# Patient Record
Sex: Female | Born: 1940 | Race: White | Hispanic: No | Smoking: Never smoker
Health system: Southern US, Community
[De-identification: ages and names within clinical notes are randomized; demographics above are authoritative.]

## PROBLEM LIST (undated history)

## (undated) DIAGNOSIS — E785 Hyperlipidemia, unspecified: Secondary | ICD-10-CM

## (undated) DIAGNOSIS — M8580 Other specified disorders of bone density and structure, unspecified site: Secondary | ICD-10-CM

## (undated) DIAGNOSIS — Z923 Personal history of irradiation: Secondary | ICD-10-CM

## (undated) DIAGNOSIS — R011 Cardiac murmur, unspecified: Secondary | ICD-10-CM

## (undated) DIAGNOSIS — C50919 Malignant neoplasm of unspecified site of unspecified female breast: Secondary | ICD-10-CM

## (undated) DIAGNOSIS — Z853 Personal history of malignant neoplasm of breast: Secondary | ICD-10-CM

## (undated) DIAGNOSIS — E079 Disorder of thyroid, unspecified: Secondary | ICD-10-CM

## (undated) DIAGNOSIS — K219 Gastro-esophageal reflux disease without esophagitis: Secondary | ICD-10-CM

## (undated) DIAGNOSIS — H269 Unspecified cataract: Secondary | ICD-10-CM

## (undated) DIAGNOSIS — E663 Overweight: Secondary | ICD-10-CM

## (undated) DIAGNOSIS — E039 Hypothyroidism, unspecified: Secondary | ICD-10-CM

## (undated) DIAGNOSIS — I10 Essential (primary) hypertension: Secondary | ICD-10-CM

## (undated) DIAGNOSIS — T7840XA Allergy, unspecified, initial encounter: Secondary | ICD-10-CM

## (undated) DIAGNOSIS — Q210 Ventricular septal defect: Secondary | ICD-10-CM

## (undated) DIAGNOSIS — I38 Endocarditis, valve unspecified: Secondary | ICD-10-CM

## (undated) HISTORY — DX: Overweight: E66.3

## (undated) HISTORY — PX: TUBAL LIGATION: SHX77

## (undated) HISTORY — DX: Essential (primary) hypertension: I10

## (undated) HISTORY — PX: AUGMENTATION MAMMAPLASTY: SUR837

## (undated) HISTORY — PX: TONSILLECTOMY: SUR1361

## (undated) HISTORY — DX: Hyperlipidemia, unspecified: E78.5

## (undated) HISTORY — DX: Disorder of thyroid, unspecified: E07.9

## (undated) HISTORY — DX: Unspecified cataract: H26.9

## (undated) HISTORY — DX: Ventricular septal defect: Q21.0

## (undated) HISTORY — DX: Allergy, unspecified, initial encounter: T78.40XA

## (undated) HISTORY — DX: Endocarditis, valve unspecified: I38

## (undated) HISTORY — DX: Malignant neoplasm of unspecified site of unspecified female breast: C50.919

## (undated) HISTORY — DX: Other specified disorders of bone density and structure, unspecified site: M85.80

## (undated) HISTORY — DX: Personal history of malignant neoplasm of breast: Z85.3

---

## 1992-02-24 DIAGNOSIS — Z923 Personal history of irradiation: Secondary | ICD-10-CM

## 1992-02-24 DIAGNOSIS — C50919 Malignant neoplasm of unspecified site of unspecified female breast: Secondary | ICD-10-CM

## 1992-02-24 HISTORY — DX: Malignant neoplasm of unspecified site of unspecified female breast: C50.919

## 1992-02-24 HISTORY — PX: BREAST LUMPECTOMY: SHX2

## 1992-02-24 HISTORY — DX: Personal history of irradiation: Z92.3

## 1992-02-24 HISTORY — PX: BREAST BIOPSY: SHX20

## 1992-02-24 HISTORY — PX: BREAST IMPLANT REMOVAL: SUR1101

## 1998-11-25 ENCOUNTER — Other Ambulatory Visit: Admission: RE | Admit: 1998-11-25 | Discharge: 1998-11-25 | Payer: Self-pay | Admitting: Family Medicine

## 1999-12-03 ENCOUNTER — Other Ambulatory Visit: Admission: RE | Admit: 1999-12-03 | Discharge: 1999-12-03 | Payer: Self-pay | Admitting: Family Medicine

## 2000-12-03 ENCOUNTER — Other Ambulatory Visit: Admission: RE | Admit: 2000-12-03 | Discharge: 2000-12-03 | Payer: Self-pay | Admitting: Family Medicine

## 2001-11-28 ENCOUNTER — Other Ambulatory Visit: Admission: RE | Admit: 2001-11-28 | Discharge: 2001-11-28 | Payer: Self-pay | Admitting: Family Medicine

## 2002-02-23 HISTORY — PX: OTHER SURGICAL HISTORY: SHX169

## 2002-12-25 HISTORY — PX: PARATHYROIDECTOMY: SHX19

## 2003-01-04 ENCOUNTER — Other Ambulatory Visit: Admission: RE | Admit: 2003-01-04 | Discharge: 2003-01-04 | Payer: Self-pay | Admitting: Family Medicine

## 2003-04-20 ENCOUNTER — Encounter: Admission: RE | Admit: 2003-04-20 | Discharge: 2003-04-20 | Payer: Self-pay | Admitting: Psychology

## 2004-01-14 ENCOUNTER — Other Ambulatory Visit: Admission: RE | Admit: 2004-01-14 | Discharge: 2004-01-14 | Payer: Self-pay | Admitting: Family Medicine

## 2004-01-14 ENCOUNTER — Ambulatory Visit: Payer: Self-pay | Admitting: Family Medicine

## 2004-07-11 ENCOUNTER — Ambulatory Visit: Payer: Self-pay | Admitting: Family Medicine

## 2004-12-24 LAB — HM DEXA SCAN

## 2005-01-14 ENCOUNTER — Other Ambulatory Visit: Admission: RE | Admit: 2005-01-14 | Discharge: 2005-01-14 | Payer: Self-pay | Admitting: Family Medicine

## 2005-01-14 ENCOUNTER — Encounter: Payer: Self-pay | Admitting: Family Medicine

## 2005-01-14 ENCOUNTER — Ambulatory Visit: Payer: Self-pay | Admitting: Family Medicine

## 2005-05-06 ENCOUNTER — Ambulatory Visit: Payer: Self-pay | Admitting: Family Medicine

## 2005-05-07 LAB — FECAL OCCULT BLOOD, GUAIAC: Fecal Occult Blood: NEGATIVE

## 2005-06-11 ENCOUNTER — Ambulatory Visit: Payer: Self-pay | Admitting: Family Medicine

## 2005-07-08 ENCOUNTER — Ambulatory Visit: Payer: Self-pay | Admitting: Family Medicine

## 2005-08-14 ENCOUNTER — Ambulatory Visit: Payer: Self-pay | Admitting: Family Medicine

## 2006-01-20 ENCOUNTER — Ambulatory Visit: Payer: Self-pay | Admitting: Family Medicine

## 2006-06-30 ENCOUNTER — Ambulatory Visit: Payer: Self-pay | Admitting: Family Medicine

## 2006-07-01 LAB — CONVERTED CEMR LAB
ALT: 19 units/L (ref 0–40)
AST: 28 units/L (ref 0–37)
LDL Cholesterol: 103 mg/dL — ABNORMAL HIGH (ref 0–99)
Total CHOL/HDL Ratio: 2.8
VLDL: 20 mg/dL (ref 0–40)

## 2006-12-15 ENCOUNTER — Encounter: Payer: Self-pay | Admitting: Family Medicine

## 2007-01-26 ENCOUNTER — Encounter: Payer: Self-pay | Admitting: Family Medicine

## 2007-01-26 DIAGNOSIS — E785 Hyperlipidemia, unspecified: Secondary | ICD-10-CM | POA: Insufficient documentation

## 2007-01-26 DIAGNOSIS — I10 Essential (primary) hypertension: Secondary | ICD-10-CM | POA: Insufficient documentation

## 2007-01-26 DIAGNOSIS — Q210 Ventricular septal defect: Secondary | ICD-10-CM | POA: Insufficient documentation

## 2007-01-26 DIAGNOSIS — M8580 Other specified disorders of bone density and structure, unspecified site: Secondary | ICD-10-CM | POA: Insufficient documentation

## 2007-01-26 DIAGNOSIS — Z8679 Personal history of other diseases of the circulatory system: Secondary | ICD-10-CM | POA: Insufficient documentation

## 2007-01-26 DIAGNOSIS — Z853 Personal history of malignant neoplasm of breast: Secondary | ICD-10-CM | POA: Insufficient documentation

## 2007-01-31 ENCOUNTER — Ambulatory Visit: Payer: Self-pay | Admitting: Family Medicine

## 2007-02-03 ENCOUNTER — Encounter: Payer: Self-pay | Admitting: Family Medicine

## 2007-02-03 LAB — CONVERTED CEMR LAB
ALT: 23 units/L (ref 0–35)
Alkaline Phosphatase: 62 units/L (ref 39–117)
BUN: 10 mg/dL (ref 6–23)
Basophils Relative: 1.2 % — ABNORMAL HIGH (ref 0.0–1.0)
CO2: 29 meq/L (ref 19–32)
Creatinine, Ser: 1 mg/dL (ref 0.4–1.2)
Eosinophils Relative: 1.3 % (ref 0.0–5.0)
HCT: 40.3 % (ref 36.0–46.0)
HDL: 74.6 mg/dL (ref >39.0)
Hemoglobin: 14.1 g/dL (ref 12.0–15.0)
LDL Cholesterol: 99 mg/dL (ref 0–99)
Monocytes Absolute: 0.6 10*3/uL (ref 0.2–0.7)
Monocytes Relative: 9 % (ref 3.0–11.0)
Potassium: 3.9 meq/L (ref 3.5–5.1)
RDW: 12.2 % (ref 11.5–14.6)
Total Bilirubin: 0.9 mg/dL (ref 0.3–1.2)
Total Protein: 7.4 g/dL (ref 6.0–8.3)
VLDL: 25 mg/dL (ref 0–40)
Vit D, 1,25-Dihydroxy: 42 (ref 30–89)
WBC: 6.5 10*3/uL (ref 4.5–10.5)

## 2007-11-02 ENCOUNTER — Encounter: Payer: Self-pay | Admitting: Family Medicine

## 2007-11-10 ENCOUNTER — Encounter (INDEPENDENT_AMBULATORY_CARE_PROVIDER_SITE_OTHER): Payer: Self-pay | Admitting: *Deleted

## 2008-02-06 ENCOUNTER — Ambulatory Visit: Payer: Self-pay | Admitting: Family Medicine

## 2008-02-06 DIAGNOSIS — E039 Hypothyroidism, unspecified: Secondary | ICD-10-CM | POA: Insufficient documentation

## 2008-02-07 LAB — CONVERTED CEMR LAB
ALT: 21 units/L (ref 0–35)
AST: 29 units/L (ref 0–37)
Albumin: 4.6 g/dL (ref 3.5–5.2)
BUN: 14 mg/dL (ref 6–23)
Basophils Relative: 0.9 % (ref 0.0–3.0)
CO2: 29 meq/L (ref 19–32)
Chloride: 108 meq/L (ref 96–112)
Creatinine, Ser: 1 mg/dL (ref 0.4–1.2)
Eosinophils Relative: 2.2 % (ref 0.0–5.0)
LDL Cholesterol: 100 mg/dL — ABNORMAL HIGH (ref 0–99)
Lymphocytes Relative: 26.7 % (ref 12.0–46.0)
Monocytes Relative: 8.6 % (ref 3.0–12.0)
Neutrophils Relative %: 61.6 % (ref 43.0–77.0)
RBC: 4.55 M/uL (ref 3.87–5.11)
TSH: 0.43 microintl units/mL (ref 0.35–5.50)
VLDL: 22 mg/dL (ref 0–40)
WBC: 5.8 10*3/uL (ref 4.5–10.5)

## 2008-02-20 ENCOUNTER — Telehealth: Payer: Self-pay | Admitting: Family Medicine

## 2009-02-11 ENCOUNTER — Ambulatory Visit: Payer: Self-pay | Admitting: Family Medicine

## 2009-02-11 ENCOUNTER — Other Ambulatory Visit: Admission: RE | Admit: 2009-02-11 | Discharge: 2009-02-11 | Payer: Self-pay | Admitting: Family Medicine

## 2009-02-11 LAB — HM PAP SMEAR

## 2009-02-13 ENCOUNTER — Encounter (INDEPENDENT_AMBULATORY_CARE_PROVIDER_SITE_OTHER): Payer: Self-pay | Admitting: *Deleted

## 2009-02-13 LAB — CONVERTED CEMR LAB
ALT: 22 units/L (ref 0–35)
AST: 29 units/L (ref 0–37)
Albumin: 4.7 g/dL (ref 3.5–5.2)
Basophils Absolute: 0.1 10*3/uL (ref 0.0–0.1)
Basophils Relative: 1.4 % (ref 0.0–3.0)
Eosinophils Relative: 1.8 % (ref 0.0–5.0)
GFR calc non Af Amer: 66.45 mL/min (ref >60)
Glucose, Bld: 96 mg/dL (ref 70–99)
HCT: 41.8 % (ref 36.0–46.0)
Hemoglobin: 14.1 g/dL (ref 12.0–15.0)
Lymphs Abs: 1.7 10*3/uL (ref 0.7–4.0)
Monocytes Relative: 8.6 % (ref 3.0–12.0)
Neutro Abs: 3.8 10*3/uL (ref 1.4–7.7)
Potassium: 3.9 meq/L (ref 3.5–5.1)
RBC: 4.44 M/uL (ref 3.87–5.11)
RDW: 12.8 % (ref 11.5–14.6)
Sodium: 142 meq/L (ref 135–145)
TSH: 0.53 microintl units/mL (ref 0.35–5.50)
Total Protein: 7.5 g/dL (ref 6.0–8.3)
VLDL: 20.4 mg/dL (ref 0.0–40.0)

## 2009-02-20 ENCOUNTER — Encounter (INDEPENDENT_AMBULATORY_CARE_PROVIDER_SITE_OTHER): Payer: Self-pay | Admitting: *Deleted

## 2009-04-29 ENCOUNTER — Ambulatory Visit: Payer: Self-pay | Admitting: Family Medicine

## 2009-04-29 DIAGNOSIS — E559 Vitamin D deficiency, unspecified: Secondary | ICD-10-CM | POA: Insufficient documentation

## 2009-05-01 ENCOUNTER — Encounter (INDEPENDENT_AMBULATORY_CARE_PROVIDER_SITE_OTHER): Payer: Self-pay | Admitting: *Deleted

## 2009-10-09 ENCOUNTER — Encounter: Payer: Self-pay | Admitting: Family Medicine

## 2009-10-16 ENCOUNTER — Encounter: Payer: Self-pay | Admitting: Family Medicine

## 2009-10-16 ENCOUNTER — Encounter (INDEPENDENT_AMBULATORY_CARE_PROVIDER_SITE_OTHER): Payer: Self-pay | Admitting: *Deleted

## 2009-12-20 ENCOUNTER — Encounter: Payer: Self-pay | Admitting: Family Medicine

## 2010-02-06 ENCOUNTER — Ambulatory Visit: Payer: Self-pay | Admitting: Family Medicine

## 2010-02-06 ENCOUNTER — Telehealth (INDEPENDENT_AMBULATORY_CARE_PROVIDER_SITE_OTHER): Payer: Self-pay | Admitting: *Deleted

## 2010-02-06 ENCOUNTER — Encounter: Payer: Self-pay | Admitting: Family Medicine

## 2010-02-08 LAB — CONVERTED CEMR LAB
Alkaline Phosphatase: 61 units/L (ref 39–117)
BUN: 15 mg/dL (ref 6–23)
Basophils Absolute: 0.1 10*3/uL (ref 0.0–0.1)
Bilirubin, Direct: 0.1 mg/dL (ref 0.0–0.3)
CO2: 29 meq/L (ref 19–32)
Chloride: 107 meq/L (ref 96–112)
Creatinine, Ser: 1 mg/dL (ref 0.4–1.2)
Eosinophils Relative: 1.9 % (ref 0.0–5.0)
HCT: 41.5 % (ref 36.0–46.0)
HDL: 76.5 mg/dL (ref >39.00)
Hemoglobin: 14.2 g/dL (ref 12.0–15.0)
Lymphs Abs: 1.9 10*3/uL (ref 0.7–4.0)
MCV: 91.2 fL (ref 78.0–100.0)
Monocytes Absolute: 0.6 10*3/uL (ref 0.1–1.0)
Monocytes Relative: 8.8 % (ref 3.0–12.0)
Neutro Abs: 3.8 10*3/uL (ref 1.4–7.7)
Platelets: 279 10*3/uL (ref 150.0–400.0)
Potassium: 4.3 meq/L (ref 3.5–5.1)
RDW: 13.4 % (ref 11.5–14.6)
TSH: 1.42 microintl units/mL (ref 0.35–5.50)
Total Bilirubin: 0.8 mg/dL (ref 0.3–1.2)
Total CHOL/HDL Ratio: 3
VLDL: 22.4 mg/dL (ref 0.0–40.0)

## 2010-02-12 ENCOUNTER — Ambulatory Visit: Payer: Self-pay | Admitting: Family Medicine

## 2010-02-26 ENCOUNTER — Ambulatory Visit
Admission: RE | Admit: 2010-02-26 | Discharge: 2010-02-26 | Payer: Self-pay | Source: Home / Self Care | Attending: Family Medicine | Admitting: Family Medicine

## 2010-03-05 ENCOUNTER — Encounter: Payer: Self-pay | Admitting: Family Medicine

## 2010-03-05 ENCOUNTER — Ambulatory Visit: Payer: Self-pay | Admitting: Family Medicine

## 2010-03-25 NOTE — Miscellaneous (Signed)
Summary: flu vaccine  Clinical Lists Changes  Observations: Added new observation of FLU VAX: Historical at [facility] (12/13/2009 11:10)      Immunization History:  Influenza Immunization History:    Influenza:  historical at [facility] (12/13/2009)

## 2010-03-25 NOTE — Miscellaneous (Signed)
Summary: Flowsheet update  Clinical Lists Changes  Observations: Added new observation of MAMMOGRAM: Normal (10/09/2009 8:24)

## 2010-03-25 NOTE — Letter (Signed)
Summary: Results Follow up Letter  [place] [facility]  [address] [place], [place] [address]   Phone: [phone]  Fax: [phone]    10/16/2009 MRN: [id number]  Madison Huerta [address] [place], [place]  [address]  Dear Ms. Appleby,  The following are the results of your recent test(s):  Test         Result    Pap Smear:        Normal _____  Not Normal _____ Comments: ______________________________________________________ Cholesterol: LDL(Bad cholesterol):         Your goal is less than:         HDL (Good cholesterol):       Your goal is more than: Comments:  ______________________________________________________ Mammogram:        Normal __X___  Not Normal _____ Comments:Repeat in 1 year  ___________________________________________________________________ Hemoccult:        Normal _____  Not normal _______ Comments:    _____________________________________________________________________ Other Tests:    We routinely do not discuss normal results over the telephone.  If you desire a copy of the results, or you have any questions about this information we can discuss them at your next office visit.   Sincerely,      Janee Morn, CMA(AAMA)for Dr. [name]

## 2010-03-25 NOTE — Miscellaneous (Signed)
  Clinical Lists Changes  Medications: Added new medication of VITAMIN D 1000 UNIT  TABS (CHOLECALCIFEROL) Take 2,000 international units daily

## 2010-03-27 NOTE — Assessment & Plan Note (Signed)
Summary: 2 week bp check/rbh  Nurse Visit   Vital Signs:  Patient profile:   70 year old female O2 Sat:      98 % on Room air Pulse rate:   77 / minute Pulse rhythm:   regular BP sitting:   154 / 68  (left arm) Cuff size:   regular  Vitals Entered By: Mervin Hack CMA Duncan Dull) (February 26, 2010 8:58 AM)  O2 Flow:  Room air CC: Blood pressure check Comments pt here for blood pressure check, pt bought in record of checks over several weeks, records in your inbox.   Preventive Screening-Counseling & Management  Comments: her blood pressures at home ranged from 130s/50s to 142/66 (only one high reading)  Allergies: 1)  Penicillin 2)  Codeine  Orders Added: 1)  Est. Patient Nurse visit [09003]  Appended Document: 2 week bp check/rbh Madison Huerta-- was the 154/68 on her cuff or our cuff? -- thanks , Marne T  Appended Document: 2 week bp check/rbh our cuff

## 2010-03-27 NOTE — Assessment & Plan Note (Signed)
Summary: CPX / LFW   Vital Signs:  Patient profile:   70 year old female Height:      60.5 inches Weight:      137.50 pounds BMI:     26.51 Temp:     97.9 degrees F oral Pulse rate:   72 / minute Pulse rhythm:   regular BP sitting:   156 / 72  (right arm) Cuff size:   regular  Vitals Entered By: Lewanda Rife LPN (February 12, 2010 9:17 AM) CC: check up of chronic probs   Vision Screening:Left eye w/o correction: 20 / 40 Right Eye w/o correction: 20 / 50 Both eyes w/o correction:  20/ 40        Vision Entered By: Lewanda Rife LPN (February 12, 2010 9:17 AM)  Hearing Screen 25db HL: Left  500 hz: 25db 1000 hz: 25db 2000 hz: 25db 4000 hz: 40db Right  500 hz: 25db 1000 hz: 25db 2000 hz: 25db 4000 hz: 25db    History of Present Illness: here for check up of chronic med problems and to review health mt list   feels very good  no new medical issues   wt is down 1 lb with bmi of 26  HTN -- bp up today at 156/72-- did take her medicine - then had a cup of coffee has whitecoat syndrome  pap 2010- normal  no symptoms or new partners or probs with paps in past   mam 8/11 nl  breast cancer survivor self exam - no lumps or changes  needs breast exam today  does not have separate f/u anymore -- 17 years   colonosc nl 08- for 5 y f/u in 2013  Td 03 flu up to date pneumovax is due in next 2 y zoster-- got that   dexa 08 wants to go ahead and schedule   hypothyroid- tsh is good  feels fine   vit D - past def -- this level 90 too high  dose 2000 international units not taking calcium  lipids good on crestor- stable Last Lipid ProfileCholesterol: 209 (02/06/2010 8:30:27 AM)HDL:  76.50 (02/06/2010 8:30:27 AM)LDL:  109 (02/11/2009 10:29:35 AM)Triglycerides:  Last Liver profileSGOT:  33 (02/06/2010 8:30:27 AM)SPGT:  18 (02/06/2010 8:30:27 AM)T. Bili:  0.8 (02/06/2010 8:30:27 AM)Alk Phos:  61 (02/06/2010 8:30:27 AM)   is eating a healthy diet -- is hard this time  of year  did boot camp exercise - and lost some weight after vacation     Allergies: 1)  Penicillin 2)  Codeine  Past History:  Past Medical History: Last updated: 02/06/2008 Hyperlipidemia Hypertension Hyperthyroidism Osteopenia Breast cancer, hx of overwt VSD - does need SBE prophyaxis (keflex 3gm 1 hour prior and 1.5 gm 6 hours post)  Past Surgical History: Last updated: Feb 17, 2007 Lumpectomy, radiation (1994) Dexa- normal (1999) Parathyroidectomy (12/2002) 2D echo- normal at [facility] (2004) Dexa- osteopenia (08/2002) Colonoscopy- polyp, diverticulosis, hemorrhoids (04/2003) TS osteopenia on dexa (12/2004) colonosc neg 2/08 - f/u in 5 years  Family History: Last updated: 17-Feb-2007 Father: deceased- lung ca, liver mets Mother: deeased- brain aneurysm Siblings: 1 brother, 1 sister GF heart dz P cousin breast ca  Social History: Last updated: 02/06/2008 Marital Status: Married Children: 2 Occupation: ins agent non smoker  occ alcohol -- 1 glass of wine daily   Risk Factors: Smoking Status: never (01/26/2007)  Review of Systems General:  Denies fatigue, loss of appetite, and malaise. Eyes:  Denies blurring and eye irritation. CV:  Denies chest pain or discomfort,  lightheadness, palpitations, and shortness of breath with exertion. Resp:  Denies cough, shortness of breath, and wheezing. GI:  Denies abdominal pain, change in bowel habits, indigestion, and nausea. GU:  Denies dysuria and urinary frequency. MS:  Denies muscle aches and cramps. Derm:  Denies itching, lesion(s), poor wound healing, and rash. Neuro:  Denies numbness and tingling. Psych:  Denies anxiety and depression. Endo:  Denies cold intolerance, excessive thirst, excessive urination, and heat intolerance. Heme:  Denies abnormal bruising and bleeding.  Physical Exam  General:  Well-developed,well-nourished,in no acute distress; alert,appropriate and cooperative throughout examination Head:   normocephalic, atraumatic, and no abnormalities observed.   Eyes:  vision grossly intact, pupils equal, pupils round, and pupils reactive to light.  no conjunctival pallor, injection or icterus  Ears:  R ear normal and L ear normal.   Nose:  no nasal discharge.   Mouth:  pharynx pink and moist.   Neck:  supple with full rom and no masses or thyromegally, no JVD or carotid bruit  Chest Wall:  No deformities, masses, or tenderness noted. Breasts:  No mass, nodules, thickening, tenderness, bulging, retraction, inflamation, nipple discharge or skin changes noted.   Lungs:  Normal respiratory effort, chest expands symmetrically. Lungs are clear to auscultation, no crackles or wheezes. Heart:  Normal rate and regular rhythm. S1 and S2 normal without gallop, murmur, click, rub or other extra sounds. Abdomen:  Bowel sounds positive,abdomen soft and non-tender without masses, organomegaly or hernias noted. no renal bruits  Msk:  No deformity or scoliosis noted of thoracic or lumbar spine.  no acute joint changes  Pulses:  R and L carotid,radial,femoral,dorsalis pedis and posterior tibial pulses are full and equal bilaterally Extremities:  No clubbing, cyanosis, edema, or deformity noted with normal full range of motion of all joints.   Neurologic:  sensation intact to light touch, gait normal, and DTRs symmetrical and normal.   Skin:  Intact without suspicious lesions or rashes lentigos diffusely  Cervical Nodes:  No lymphadenopathy noted Axillary Nodes:  No palpable lymphadenopathy Inguinal Nodes:  No significant adenopathy Psych:  normal affect, talkative and pleasant    Impression & Recommendations:  Problem # 1:  UNSPECIFIED VITAMIN D DEFICIENCY (ICD-268.9) Assessment Deteriorated  this is actually over supplemented now wth level of 90 will cut dose in 1/2 and watch   Orders: Prescription Created Electronically [phone])  Problem # 2:  BREAST CANCER, HX OF (ICD-V10.3) Assessment:  Unchanged  mam utd exam  enc continue self exams  Orders: Prescription Created Electronically [phone])  Problem # 3:  OSTEOPENIA (ICD-733.90) Assessment: Unchanged check dexa this year dec vit d dose enc calcium and exercise  The following medications were removed from the medication list:    Vitamin D (ergocalciferol) 50000 Unit Caps (Ergocalciferol) .Marland Kitchen... Take one by mouth weekly x 10 weeks Her updated medication list for this problem includes:    Vitamin D 1000 Unit Tabs (Cholecalciferol) .Marland Kitchen... Take 1000  international units daily  Orders: Radiology Referral (Radiology) Prescription Created Electronically [phone])  Problem # 4:  UNSPECIFIED HYPOTHYROIDISM (ICD-244.9) Assessment: Unchanged  stable clinically tsh tx no change in dose  Her updated medication list for this problem includes:    Levoxyl 75 Mcg Tabs (Levothyroxine sodium) .Marland Kitchen... 1 by mouth once daily  Labs Reviewed: TSH: 1.42 (02/06/2010)    Chol: 209 (02/06/2010)   HDL: 76.50 (02/06/2010)   LDL: 109 (02/11/2009)   TG: 112.0 (02/06/2010)  Orders: Prescription Created Electronically [phone])  Problem # 5:  HYPERTENSION (ICD-401.9)  Assessment: Deteriorated  bp high today  will start checking at home and nurse visit in 2 wk if not whitecoat- will have to adj tx Her updated medication list for this problem includes:    Norvasc 5 Mg Tabs (Amlodipine besylate) .Marland Kitchen... 1 by mouth once daily  BP today: 156/72 Prior BP: 120/68 (02/06/2008)  Labs Reviewed: K+: 4.3 (02/06/2010) Creat: : 1.0 (02/06/2010)   Chol: 209 (02/06/2010)   HDL: 76.50 (02/06/2010)   LDL: 109 (02/11/2009)   TG: 112.0 (02/06/2010)  Orders: Prescription Created Electronically [phone])  Problem # 6:  HYPERLIPIDEMIA (ICD-272.4) Assessment: Improved  well controlled on crestor and diet  rev low sat fat diet in detail rev labs with pt  Her updated medication list for this problem includes:    Crestor 10 Mg Tabs (Rosuvastatin calcium) .Marland Kitchen...  Take one by mouth once daily  Labs Reviewed: SGOT: 33 (02/06/2010)   SGPT: 18 (02/06/2010)   HDL:76.50 (02/06/2010), 70.80 (02/11/2009)  LDL:109 (02/11/2009), 100 (02/06/2008)  Chol:209 (02/06/2010), 200 (02/11/2009)  Trig:112.0 (02/06/2010), 102.0 (02/11/2009)  Orders: Prescription Created Electronically [phone])  Complete Medication List: 1)  Crestor 10 Mg Tabs (Rosuvastatin calcium) .... Take one by mouth once daily 2)  Levoxyl 75 Mcg Tabs (Levothyroxine sodium) .Marland Kitchen.. 1 by mouth once daily 3)  Norvasc 5 Mg Tabs (Amlodipine besylate) .Marland Kitchen.. 1 by mouth once daily 4)  Calcium 1200 Mg  .... Take by mouth daily as directed 5)  Vitamin D 1000 Unit Tabs (Cholecalciferol) .... Take 1000  international units daily  Other Orders: Pneumococcal Vaccine (96295) Admin 1st Vaccine (28413)  Patient Instructions: 1)  decrease vitamin D to 1000 international units daily 2)  the current recommendation for calcium intake is 1200-1500 mg daily  3)  we will schedule dexa at check out  4)  keep up healthy diet and exercise  5)  pneumonia vaccine today  6)  if you can get a blood pressure cuff get omron for arm size regular and bring it with your with bp readings for nurse visit  7)  schedule nurse visit for bp check in 2 weeks please  Prescriptions: NORVASC 5 MG TABS (AMLODIPINE BESYLATE) 1 by mouth once daily  #30 x 11   Entered and Authorized by:   Judith Part MD   Signed by:   Judith Part MD on 02/12/2010   Method used:   Electronically to        [facility]. [address] [phone]* (retail)       [address] [place], [place]  [id number]       Ph: [phone]       Fax: [phone]   RxID:   [id number] LEVOXYL 75 MCG TABS (LEVOTHYROXINE SODIUM) 1 by mouth once daily  #30 x 11   Entered and Authorized by:   Judith Part MD   Signed by:   Judith Part MD on 02/12/2010   Method used:   Electronically to        [facility]. [address] [phone]* (retail)       [address]  [place], [place]  [id number]       Ph: [phone]       Fax: [phone]   RxID:   [id number] CRESTOR 10 MG  TABS (ROSUVASTATIN CALCIUM) take one by mouth once daily  #30 x 11   Entered and Authorized by:   Judith Part MD  Signed by:   Judith Part MD on 02/12/2010   Method used:   Electronically to        [facility]. [address] [phone]* (retail)       [address] [place], [place]  [id number]       Ph: [phone]       Fax: [phone]   RxID:   [id number]    Orders Added: 1)  Radiology Referral [Radiology] 2)  Pneumococcal Vaccine [90732] 3)  Admin 1st Vaccine [90471] 4)  Prescription Created Electronically [G8553] 5)  Est. Patient Level IV [28315]   Immunizations Administered:  Pneumonia Vaccine:    Vaccine Type: Pneumovax    Site: right deltoid    Mfr: Merck    Dose: 0.5 ml    Route: IM    Given by: Lewanda Rife LPN    Exp. Date: 07/03/2011    Lot #: [id number]    VIS given: 01/28/09 version given February 12, 2010.   Immunizations Administered:  Pneumonia Vaccine:    Vaccine Type: Pneumovax    Site: right deltoid    Mfr: Merck    Dose: 0.5 ml    Route: IM    Given by: Lewanda Rife LPN    Exp. Date: 07/03/2011    Lot #: [id number]    VIS given: 01/28/09 version given February 12, 2010.  Current Allergies (reviewed today): PENICILLIN CODEINE

## 2010-03-27 NOTE — Progress Notes (Signed)
----   Converted from flag ---- ---- 02/05/2010 4:50 PM, Colon Flattery Tower MD wrote: please check lipid/ hepatic/renal/ cbc with diff/ tsh / vit D for 401.1, 272, 244.9, and 733.0 thanks  ---- 02/05/2010 1:09 PM, Liane Comber CMA (AAMA) wrote:   ---- 02/04/2010 9:53 AM, Liane Comber CMA (AAMA) wrote: Lab orders please! Good Morning! This pt is scheduled for cpx labs [place], which labs to draw and dx codes to use? Thanks Tasha ------------------------------

## 2010-07-07 ENCOUNTER — Telehealth: Payer: Self-pay | Admitting: *Deleted

## 2010-07-07 DIAGNOSIS — Z853 Personal history of malignant neoplasm of breast: Secondary | ICD-10-CM

## 2010-07-07 NOTE — Telephone Encounter (Signed)
I know she has had hx of breast cancer - so I think is diagnostic instead of screening Which side did she have surgery on and what kind of sugery? -- thanks / cannot access old rec right now Let me know and I will order

## 2010-07-07 NOTE — Telephone Encounter (Signed)
Pt wants to start getting her mammograms at [facility], she has been getting these at [facility].  She will call to make her appt but we will need to send an order.

## 2010-07-08 NOTE — Telephone Encounter (Signed)
Thanks - I will do ref and send to [facility]

## 2010-07-08 NOTE — Telephone Encounter (Signed)
Pt had surgery on lt breast. Lumpectomy but they took out the nipple and surrounding tissue because that is where the lumpectomy was done. Pt said had surgery 18 yrs ago.

## 2010-07-14 NOTE — Telephone Encounter (Signed)
Diagnostic MmG at [facility] set up for 11/03/2010 at 9am. Order mailed to the pt. MK

## 2010-11-03 ENCOUNTER — Encounter: Payer: Self-pay | Admitting: Family Medicine

## 2010-11-03 ENCOUNTER — Ambulatory Visit: Payer: Self-pay | Admitting: Family Medicine

## 2010-11-04 ENCOUNTER — Encounter: Payer: Self-pay | Admitting: *Deleted

## 2010-11-04 LAB — HM MAMMOGRAPHY: HM Mammogram: NORMAL

## 2010-11-06 ENCOUNTER — Ambulatory Visit (INDEPENDENT_AMBULATORY_CARE_PROVIDER_SITE_OTHER): Payer: 59 | Admitting: Family Medicine

## 2010-11-06 ENCOUNTER — Encounter: Payer: Self-pay | Admitting: Family Medicine

## 2010-11-06 DIAGNOSIS — Z853 Personal history of malignant neoplasm of breast: Secondary | ICD-10-CM

## 2010-11-06 DIAGNOSIS — R059 Cough, unspecified: Secondary | ICD-10-CM | POA: Insufficient documentation

## 2010-11-06 DIAGNOSIS — R05 Cough: Secondary | ICD-10-CM | POA: Insufficient documentation

## 2010-11-06 MED ORDER — BENZONATATE 200 MG PO CAPS: 200.0000 mg | ORAL_CAPSULE | Freq: Three times a day (TID) | ORAL | Status: AC | PRN

## 2010-11-06 NOTE — Progress Notes (Signed)
Cough. Stated about 2 months ago, would wax and wane. "I didn't know if it was allergies."  Now with ST this AM, chest feels tight (minimal).  No FCNAV.  Minimal clear sputum after taking mucinex last week.  Took benadryl to sleep last night.  Not on ACE.  No h/o asthma.  Occ heartburn. Some postnasal gtt, but this is recent and not going on for the last 2 months.  Nonsmoker.  Claritin had helped some with the cough over the last 2 months.    Meds, vitals, and allergies reviewed.   ROS: See HPI.  Otherwise, noncontributory.  GEN: nad, alert and oriented HEENT: mucous membranes moist, tm w/o erythema, nasal exam w/o erythema, clear discharge noted,  OP with cobblestoning but no exudates NECK: supple w/o LA CV: rrr.  Murmur noted, old finding PULM: ctab, no inc wob EXT: no edema SKIN: no acute rash

## 2010-11-06 NOTE — Patient Instructions (Signed)
I would take the tessalon for cough now and get some rest.  I think you likely have a virus.   For the cough that has been going on for months- it could be allergy or heartburn.  I would take claritin 10mg  at night and see if that helps.  If you don't get better, or if you continue to have heartburn, then try zantac 150mg  1-2 times a day.  If still not improved, then see Dr. Milinda Antis.  Take care.

## 2010-11-06 NOTE — Assessment & Plan Note (Signed)
Likely viral process on top of chronic cough.  No ACE, smoking.  Possible allergy vs GERD.  Would treat as allergy with H2 blocker if not improved, ddx d/w pt.  She understood.  Supportive tx in meantime, tessalon for cough now.  She agrees.

## 2010-11-27 ENCOUNTER — Encounter: Payer: Self-pay | Admitting: Family Medicine

## 2010-12-18 ENCOUNTER — Encounter: Payer: Self-pay | Admitting: Family Medicine

## 2010-12-18 ENCOUNTER — Ambulatory Visit (INDEPENDENT_AMBULATORY_CARE_PROVIDER_SITE_OTHER): Payer: 59 | Admitting: Family Medicine

## 2010-12-18 DIAGNOSIS — R12 Heartburn: Secondary | ICD-10-CM

## 2010-12-18 DIAGNOSIS — I10 Essential (primary) hypertension: Secondary | ICD-10-CM

## 2010-12-18 NOTE — Assessment & Plan Note (Signed)
Elevated today. She thinks possibly due to being on edge from visit. Will recheck in two weeks and if still high, will add ACEI.

## 2010-12-18 NOTE — Assessment & Plan Note (Signed)
Will presume this to be heartburn. Discussed diff of diagnosis. Don't think so but could be cardiac. If sxs persist, becomes exertional, diaphoretic, nauseating, atec, to ER at [facility] for acute eval.

## 2010-12-18 NOTE — Progress Notes (Signed)
  Subjective:    Patient ID: Madison Huerta, female    DOB: September 02, 1940, 70 y.o.   MRN: [id number]  HPI Pt of Dr Royden Purl here as acute appt for chest pain the pt characterizes as heartburn. She woke up 5-6 days ago ok and then had a pain in her chest that resolved on its own after about one hour after eating cereal and coffee as usual. She has had heartburn in the past but she characterizes this as bad indigestion. Her discomfort was more dull than heartburn at 5-6/10 at its worst for about an hour. She gets heartburn rarely but lately she has been getting it more frequently lately altho not daily. Yesterday she went to visit a sick friend and after visiting while in the car she had another 10 min episode that resolved. She had had severe LBP upon awakening that day. She denies fever or chills. She took nothing for this except she took an ASA with the two episodes. The sxs were not worsened by moving or exertion. She was not nauseated per se with the sxs but had a mild case she presumed was from extra drainage she has had lately. She was not diaphoretic with the episodes. She did have some diaphoresis in the middle of the night.  She has had a dry cough all summer and has had increased drainage for months, no itchy eyes, no increased sneezing, she has no feelings of increased congestion.    Review of Systems  Constitutional: Negative for fever, chills, diaphoresis, activity change and fatigue.  HENT: Negative for ear pain, congestion, rhinorrhea and postnasal drip.   Eyes: Negative for redness.  Respiratory: Negative for cough, chest tightness, shortness of breath and wheezing.   Cardiovascular: Positive for chest pain (see HPI). Negative for palpitations and leg swelling.       Objective:   Physical Exam  Constitutional: She appears well-developed and well-nourished. No distress.  HENT:  Head: Normocephalic and atraumatic.  Right Ear: External ear normal.  Left Ear: External ear normal.  Nose:  Nose normal.  Mouth/Throat: Oropharynx is clear and moist. No oropharyngeal exudate.  Eyes: Conjunctivae and EOM are normal. Pupils are equal, round, and reactive to light.  Neck: Normal range of motion. Neck supple. No thyromegaly present.  Cardiovascular: Normal rate, regular rhythm and normal heart sounds.   Pulmonary/Chest: Effort normal and breath sounds normal. She has no wheezes. She has no rales.  Abdominal: Soft. Bowel sounds are normal. She exhibits no distension. There is no tenderness. There is no rebound.  Lymphadenopathy:    She has no cervical adenopathy.  Skin: She is not diaphoretic.          Assessment & Plan:

## 2010-12-18 NOTE — Patient Instructions (Addendum)
Start Prilosec OTC twice a day, 45 mins before eating. Use Maalox for acute sxs. To ER for discussed sxs in an episode.Marland Kitchen GERD can be treated with medication but also with lifestyle changes including avoidance of late meals, excess alcohol, smoking cessation, avoiding spicy foods, chocolate, peppermint, colas, red wine and  acidic juices such as orange or tomato juice. Do not take mint or menthol products, use sugarless candy instead (Jolly Ranchers) Recheck BP next time. 30 mins spent with pt, 40% spent with discussion.

## 2010-12-25 ENCOUNTER — Ambulatory Visit (INDEPENDENT_AMBULATORY_CARE_PROVIDER_SITE_OTHER): Payer: 59 | Admitting: Family Medicine

## 2010-12-25 ENCOUNTER — Encounter: Payer: Self-pay | Admitting: Family Medicine

## 2010-12-25 DIAGNOSIS — I10 Essential (primary) hypertension: Secondary | ICD-10-CM

## 2010-12-25 DIAGNOSIS — R0982 Postnasal drip: Secondary | ICD-10-CM | POA: Insufficient documentation

## 2010-12-25 DIAGNOSIS — R05 Cough: Secondary | ICD-10-CM

## 2010-12-25 DIAGNOSIS — R12 Heartburn: Secondary | ICD-10-CM

## 2010-12-25 DIAGNOSIS — R059 Cough, unspecified: Secondary | ICD-10-CM

## 2010-12-25 NOTE — Assessment & Plan Note (Addendum)
Would continue to follow for now. BP better today although still elevated. Possibly add ACEI next time if still high. BP Readings from Last 3 Encounters:  12/25/10 150/70  12/18/10 170/80  11/06/10 142/76

## 2010-12-25 NOTE — Progress Notes (Signed)
  Subjective:    Patient ID: Madison Huerta, female    DOB: 07-24-1940, 70 y.o.   MRN: [id number]  HPI Pt here for 1 week followup after what was thought to be GERD attack. She also had elevated BP at the time 170/80. She has been on Prilosec bid and has used Maalox a few times with resolution. She feels better and thinks things are significantly improved. She has PND, especially at night and feels drip down the throat that she thinks essentially happens worse during Fall and possibly Spring also, seasonally at least once a year. She had used antihistamine in the past but doesn't remember, when, how or how much. She remembers taking Benadryl and has done this recently as she thinks this works the best.    Review of SystemsNoncontributory except as above.       Objective:   Physical Exam  Constitutional: She appears well-developed and well-nourished. No distress.  HENT:  Head: Normocephalic and atraumatic.  Right Ear: External ear normal.  Left Ear: External ear normal.  Nose: Nose normal.  Mouth/Throat: Oropharynx is clear and moist. No oropharyngeal exudate.  Eyes: Conjunctivae and EOM are normal. Pupils are equal, round, and reactive to light.  Neck: Normal range of motion. Neck supple. No thyromegaly present.  Cardiovascular: Normal rate, regular rhythm and normal heart sounds.   Pulmonary/Chest: Effort normal and breath sounds normal. She has no wheezes. She has no rales.  Lymphadenopathy:    She has no cervical adenopathy.  Skin: She is not diaphoretic.          Assessment & Plan:

## 2010-12-25 NOTE — Assessment & Plan Note (Signed)
Symptoms significantly improved with having had to use Maalox only a few times, once in the middle of the night with PND. See instructions.

## 2010-12-25 NOTE — Assessment & Plan Note (Signed)
This appears to be a reaction to the chronic postnasal drip. See PND.

## 2010-12-25 NOTE — Assessment & Plan Note (Signed)
Suggest regular nonsedating antihistamine and augmentation as needed.  See instructions.

## 2010-12-25 NOTE — Patient Instructions (Addendum)
RTC DR Tower 7 weeks for recheck. Take Prilosec twice a day for total one month, then once before brfst until seen. Maalox for acute sxs. Claritin or Allegra every morning. Benadryl at bed, repeat if wake in middle of nite with drainage. Take Guaifenesin (400mg ), take 1 tab by mouth AM. Get GUAIFENESIN by  going to [facility] or [facility] and getting MUCOUS RELIEF EXPECTORANT/CONGESTION. DO NOT GET MUCINEX (Timed Release Guaifenesin)  Drink fluids.

## 2011-02-16 ENCOUNTER — Other Ambulatory Visit: Payer: Self-pay | Admitting: Family Medicine

## 2011-02-16 NOTE — Telephone Encounter (Signed)
[facility] [employer] [facility] request refill Norvasc 5 mg #30 x11,Crestor 10 mg #30 x11 and Levoxyl 75 mcg #30 x 11. Spoke with pt and she has appt 02/18/11.

## 2011-02-18 ENCOUNTER — Ambulatory Visit (INDEPENDENT_AMBULATORY_CARE_PROVIDER_SITE_OTHER): Payer: 59 | Admitting: Family Medicine

## 2011-02-18 ENCOUNTER — Telehealth: Payer: Self-pay | Admitting: Internal Medicine

## 2011-02-18 ENCOUNTER — Encounter: Payer: Self-pay | Admitting: Family Medicine

## 2011-02-18 VITALS — BP 158/72 | HR 84 | Temp 98.1°F | Ht 60.5 in | Wt 137.0 lb

## 2011-02-18 DIAGNOSIS — Z853 Personal history of malignant neoplasm of breast: Secondary | ICD-10-CM

## 2011-02-18 DIAGNOSIS — M899 Disorder of bone, unspecified: Secondary | ICD-10-CM

## 2011-02-18 DIAGNOSIS — Z23 Encounter for immunization: Secondary | ICD-10-CM

## 2011-02-18 DIAGNOSIS — M949 Disorder of cartilage, unspecified: Secondary | ICD-10-CM

## 2011-02-18 DIAGNOSIS — E559 Vitamin D deficiency, unspecified: Secondary | ICD-10-CM

## 2011-02-18 DIAGNOSIS — E039 Hypothyroidism, unspecified: Secondary | ICD-10-CM

## 2011-02-18 DIAGNOSIS — I10 Essential (primary) hypertension: Secondary | ICD-10-CM

## 2011-02-18 DIAGNOSIS — E785 Hyperlipidemia, unspecified: Secondary | ICD-10-CM

## 2011-02-18 LAB — COMPREHENSIVE METABOLIC PANEL
ALT: 15 U/L (ref 0–35)
Albumin: 4.7 g/dL (ref 3.5–5.2)
CO2: 27 mEq/L (ref 19–32)
Calcium: 9.4 mg/dL (ref 8.4–10.5)
Chloride: 106 mEq/L (ref 96–112)
GFR: 53.51 mL/min — ABNORMAL LOW (ref >60.00)
Glucose, Bld: 101 mg/dL — ABNORMAL HIGH (ref 70–99)
Potassium: 4.2 mEq/L (ref 3.5–5.1)
Sodium: 143 mEq/L (ref 135–145)
Total Bilirubin: 0.5 mg/dL (ref 0.3–1.2)
Total Protein: 7.5 g/dL (ref 6.0–8.3)

## 2011-02-18 LAB — CBC WITH DIFFERENTIAL/PLATELET
Basophils Absolute: 0.1 10*3/uL (ref 0.0–0.1)
Eosinophils Relative: 1.3 % (ref 0.0–5.0)
HCT: 41.5 % (ref 36.0–46.0)
Lymphocytes Relative: 21.7 % (ref 12.0–46.0)
Monocytes Relative: 6.8 % (ref 3.0–12.0)
Neutrophils Relative %: 69.5 % (ref 43.0–77.0)
Platelets: 278 10*3/uL (ref 150.0–400.0)
RDW: 13 % (ref 11.5–14.6)
WBC: 7.3 10*3/uL (ref 4.5–10.5)

## 2011-02-18 LAB — LIPID PANEL
HDL: 73.3 mg/dL (ref >39.00)
Total CHOL/HDL Ratio: 3

## 2011-02-18 LAB — TSH: TSH: 0.2 u[IU]/mL — ABNORMAL LOW (ref 0.35–5.50)

## 2011-02-18 MED ORDER — OMEPRAZOLE MAGNESIUM 20 MG PO TBEC: 20.0000 mg | DELAYED_RELEASE_TABLET | Freq: Two times a day (BID) | ORAL | Status: DC

## 2011-02-18 MED ORDER — AMLODIPINE BESYLATE 5 MG PO TABS: 5.0000 mg | ORAL_TABLET | Freq: Every day | ORAL | Status: DC

## 2011-02-18 MED ORDER — ROSUVASTATIN CALCIUM 10 MG PO TABS: 10.0000 mg | ORAL_TABLET | Freq: Every day | ORAL | Status: DC

## 2011-02-18 MED ORDER — LEVOTHYROXINE SODIUM 75 MCG PO TABS: 75.0000 ug | ORAL_TABLET | Freq: Every day | ORAL | Status: DC

## 2011-02-18 NOTE — Progress Notes (Signed)
Subjective:    Patient ID: Madison Huerta, female    DOB: 28-Feb-1940, 70 y.o.   MRN: [id number]  HPI Here for check up of chronic medical conditions and to review health mt list   Feels pretty good overall  A lot of stress in the family  Had to come in for cp and heartburn - saw Dr Hetty Ely several times - put her on prilosec - doing much better   Wt is down 3 lb with bmi of 26 Some stress and also a healthy eater  Needs to do labs today- fasting  bp is     158/72 Today-- is always fine at home- whitecoat HTN  No cp or palpitations or headaches or edema  No side effects to medicines   On norvasc   Osteopenia --just had it done again and it was fine - reassuring  Last dexa Vit D level Ca and D-- needs to do better   Lipids -on crestor Lab Results  Component Value Date   CHOL 209* 02/06/2010   HDL 76.50 02/06/2010   LDLCALC 109* 02/11/2009   LDLDIRECT 107.1 02/06/2010   TRIG 112.0 02/06/2010   CHOLHDL 3 02/06/2010    Diet -- has been good  Very compliant with med   Hypothyroid Due for tsh  Clinically- no changes at all Nl skin and hair and also no change in energy   Tdap- was 2003  utd other imms  Mam 9/12 Hx of breast ca No additional f/u -- 18 years cancer free   colonosc 08--thinks is due this year -- Dr Troy Sine- will make own appt    Pap 12/10-normal  Never had abn paps  No new partners , no no partners No symptoms or problems  Is menopausal   Patient Active Problem List  Diagnoses  . UNSPECIFIED HYPOTHYROIDISM  . UNSPECIFIED DISEASE OF THYMUS GLAND  . UNSPECIFIED VITAMIN D DEFICIENCY  . HYPERLIPIDEMIA  . HYPERTENSION  . OSTEOPENIA  . BREAST CANCER, HX OF  . VENTRICULAR SEPTAL DEFECT, HX OF  . Cough  . Heartburn  . PND (post-nasal drip)   Past Medical History  Diagnosis Date  . Hyperlipidemia   . Hypertension   . Thyroid disease     Hypothyroidism  . Osteopenia   . History of breast cancer   . Overweight   . VSD (ventricular septal  defect)     does need SBE prophylaxis (Keflex  3 gm 1 hour pprior and 1.5 gm 6 hours post   Past Surgical History  Procedure Date  . Breast lumpectomy 1994    Radiation  . Parathyroidectomy 12/2002  . 2d echo 2004    Normal at [facility]   History  Substance Use Topics  . Smoking status: Never Smoker   . Smokeless tobacco: Never Used  . Alcohol Use: Yes     1 glass of wine daily   Family History  Problem Relation Age of Onset  . Aneurysm Mother     brain  . Cancer Father     Lung, Liver mets  . Heart disease Other   . Cancer Cousin     Breast CA   Allergies  Allergen Reactions  . Codeine     REACTION: severe headache  . Penicillins     REACTION: hives   Current Outpatient Prescriptions on File Prior to Visit  Medication Sig Dispense Refill  . calcium carbonate (TUMS) 500 MG chewable tablet Chew 1 tablet by mouth as needed.        Marland Kitchen  calcium carbonate (OS-CAL) 600 MG TABS Take 600 mg by mouth daily.        . Cholecalciferol (VITAMIN D) 1000 UNITS capsule Take 1,000 Units by mouth daily.               Review of Systems Review of Systems  Constitutional: Negative for fever, appetite change, fatigue and unexpected weight change.  Eyes: Negative for pain and visual disturbance.  Respiratory: Negative for cough and shortness of breath.   Cardiovascular: Negative for cp or palpitations    Gastrointestinal: Negative for nausea, diarrhea and constipation.  Genitourinary: Negative for urgency and frequency.  Skin: Negative for pallor or rash   Neurological: Negative for weakness, light-headedness, numbness and headaches.  Hematological: Negative for adenopathy. Does not bruise/bleed easily.  Psychiatric/Behavioral: Negative for dysphoric mood. The patient is not nervous/anxious.          Objective:   Physical Exam  Constitutional: She appears well-developed and well-nourished. No distress.  HENT:  Head: Normocephalic and atraumatic.  Right Ear: External ear normal.    Left Ear: External ear normal.  Nose: Nose normal.  Mouth/Throat: Oropharynx is clear and moist.  Eyes: Conjunctivae and EOM are normal. Pupils are equal, round, and reactive to light. No scleral icterus.  Neck: Normal range of motion. Neck supple. No JVD present. Carotid bruit is not present. No thyromegaly present.  Cardiovascular: Normal rate, regular rhythm, normal heart sounds and intact distal pulses.  Exam reveals no gallop.   Pulmonary/Chest: Effort normal and breath sounds normal. No respiratory distress. She has no wheezes.  Abdominal: Soft. Bowel sounds are normal. She exhibits no distension and no mass. There is no tenderness.  Genitourinary: No breast swelling, tenderness, discharge or bleeding.       Post surgical breast changes noted Breast exam: No mass, nodules, thickening, tenderness, bulging, retraction, inflamation, nipple discharge or skin changes noted.  No axillary or clavicular LA.  Chaperoned exam.    Musculoskeletal: Normal range of motion. She exhibits no edema and no tenderness.  Lymphadenopathy:    She has no cervical adenopathy.  Neurological: She is alert. She has normal reflexes. No cranial nerve deficit. She exhibits normal muscle tone. Coordination normal.  Skin: Skin is warm and dry. No rash noted. No erythema. No pallor.  Psychiatric: She has a normal mood and affect.          Assessment & Plan:

## 2011-02-18 NOTE — Assessment & Plan Note (Signed)
Labs today Rev low sat fat diet  Ref crestor  Will review

## 2011-02-18 NOTE — Assessment & Plan Note (Signed)
Level today Rev otc suppl  Will see if she needs more than that

## 2011-02-18 NOTE — Assessment & Plan Note (Signed)
tsh today No clinical changes at all No change in exam

## 2011-02-18 NOTE — Telephone Encounter (Signed)
[facility] called and stated her insurance only pays for the Omeprazole capsules gave permission to go ahead and fill capsules instead of tablets.

## 2011-02-18 NOTE — Patient Instructions (Signed)
Labs today  Try to get 1200-1500 mg of calcium per day with at least 1000 iu of vitamin D - for bone health  Tdap vaccine today for tetanus and whooping cough  Make sure to check in with [facility] office about your next colonoscopy  Keep up the good work with healthy diet and exercise

## 2011-02-18 NOTE — Assessment & Plan Note (Signed)
Recent dexa normal - re assuring Rev rec for ca and D and exercise Will continue to monitor

## 2011-02-18 NOTE — Assessment & Plan Note (Signed)
bp in fair control at this time -- fine at home (has whitecoat htn)- has home cuff which is reliable No changes needed -continue norvasc  Disc lifstyle change with low sodium diet and exercise

## 2011-02-18 NOTE — Assessment & Plan Note (Signed)
Nl mam this fall - no re occurrence for 18 years Nl exam today Enc self exams

## 2011-02-19 LAB — VITAMIN D 25 HYDROXY (VIT D DEFICIENCY, FRACTURES): Vit D, 25-Hydroxy: 49 ng/mL (ref 30–89)

## 2011-02-20 ENCOUNTER — Telehealth: Payer: Self-pay | Admitting: Family Medicine

## 2011-02-20 MED ORDER — LEVOTHYROXINE SODIUM 50 MCG PO TABS: 50.0000 ug | ORAL_TABLET | Freq: Every day | ORAL | Status: DC

## 2011-02-20 NOTE — Telephone Encounter (Signed)
Labs ok but tsh low which means we need to decrease her dose Px written for call in  (don't know which pharmacy to send it to )  Re check tsh please for hypothyroidism in 6 weeks

## 2011-02-20 NOTE — Telephone Encounter (Signed)
Patient notified as instructed by telephone. Medication phoned to [facility] [address] pharmacy as instructed.Pt will call back to schedule appt.

## 2011-02-24 HISTORY — PX: BREAST BIOPSY: SHX20

## 2011-04-03 ENCOUNTER — Other Ambulatory Visit: Payer: Self-pay | Admitting: Family Medicine

## 2011-04-03 DIAGNOSIS — E039 Hypothyroidism, unspecified: Secondary | ICD-10-CM

## 2011-04-06 ENCOUNTER — Other Ambulatory Visit (INDEPENDENT_AMBULATORY_CARE_PROVIDER_SITE_OTHER): Payer: Medicare Other

## 2011-04-06 DIAGNOSIS — E039 Hypothyroidism, unspecified: Secondary | ICD-10-CM

## 2011-04-14 ENCOUNTER — Telehealth: Payer: Self-pay | Admitting: Family Medicine

## 2011-04-14 ENCOUNTER — Other Ambulatory Visit: Payer: Self-pay | Admitting: Family Medicine

## 2011-04-14 DIAGNOSIS — E039 Hypothyroidism, unspecified: Secondary | ICD-10-CM

## 2011-04-14 MED ORDER — LEVOTHYROXINE SODIUM 75 MCG PO TABS: 75.0000 ug | ORAL_TABLET | Freq: Every day | ORAL | Status: DC

## 2011-04-14 NOTE — Telephone Encounter (Signed)
Patient notified as instructed by telephone. Lab appt scheduled as instructed 05/27/11 at 11am.

## 2011-04-14 NOTE — Telephone Encounter (Signed)
Message copied by Judy Pimple on Tue Apr 14, 2011  8:13 AM ------      Message from: Patience Musca      Created: Mon Apr 13, 2011  4:57 PM      Regarding: update on thyroid med.                   ----- Message -----         From: [name], MD         Sent: 04/06/2011  10:54 PM           To: Yetta Glassman, LPN            tsh high - so need to inc dose- but before I do this please confirm she has not missed any doses      thanks

## 2011-04-14 NOTE — Telephone Encounter (Signed)
Left message for pt to call back. Medication phoned to [facility]. pharmacy as instructed.

## 2011-04-14 NOTE — Telephone Encounter (Signed)
Ok- we need to inc dose Px written for call in  Please  Schedule non fasting lab for tsh in 6 weeks for hypothyroid-thanks

## 2011-05-11 ENCOUNTER — Telehealth: Payer: Self-pay | Admitting: Family Medicine

## 2011-05-11 DIAGNOSIS — Z853 Personal history of malignant neoplasm of breast: Secondary | ICD-10-CM

## 2011-05-11 NOTE — Telephone Encounter (Signed)
Pt need order for yrly mammogram faxed to [place], Pt has breast cancer..Madison Huerta

## 2011-05-27 ENCOUNTER — Other Ambulatory Visit (INDEPENDENT_AMBULATORY_CARE_PROVIDER_SITE_OTHER): Payer: 59

## 2011-05-27 DIAGNOSIS — E039 Hypothyroidism, unspecified: Secondary | ICD-10-CM

## 2011-06-09 ENCOUNTER — Telehealth: Payer: Self-pay

## 2011-06-09 NOTE — Telephone Encounter (Signed)
tsh was theraputic  Lab Results  Component Value Date   TSH 0.55 05/27/2011   so no change in dose needed - stay on what you are on

## 2011-06-09 NOTE — Telephone Encounter (Signed)
Patient advised as instructed via telephone. 

## 2011-06-09 NOTE — Telephone Encounter (Signed)
Pt request thyroid lab results from 05/27/11. Pt can be reached at [phone].

## 2011-06-10 ENCOUNTER — Telehealth: Payer: Self-pay | Admitting: *Deleted

## 2011-06-10 MED ORDER — LEVOTHYROXINE SODIUM 75 MCG PO TABS: 75.0000 ug | ORAL_TABLET | Freq: Every day | ORAL | Status: DC

## 2011-06-10 NOTE — Telephone Encounter (Signed)
Received a note from the pharmacy stating that patient is currently on Levoxyl. Levoxyl is on recall and not available. Patient needs a new script to replace Levoxyl. Patient would like to stay on a generic (possibly Levothyroxine) Please advise.

## 2011-06-10 NOTE — Telephone Encounter (Signed)
Please change her to levothyroxine- same dose Can have 6 mo of refils

## 2011-06-10 NOTE — Telephone Encounter (Signed)
Patient advised as instructed that Levothyroxine was sent to her pharmacy.

## 2011-06-15 NOTE — Telephone Encounter (Signed)
Pt said Levothyroxine was not at pharmacy. I spoke with Jill Side at [facility] and she had no call in. I called in Levothyroxine 75 mcg # 30 x 6 to [facility]. as instructed. Pt notified med called in while pt was on phone.

## 2011-09-25 ENCOUNTER — Encounter: Payer: Self-pay | Admitting: Family Medicine

## 2011-09-25 ENCOUNTER — Ambulatory Visit (INDEPENDENT_AMBULATORY_CARE_PROVIDER_SITE_OTHER): Payer: 59 | Admitting: Family Medicine

## 2011-09-25 VITALS — BP 152/70 | HR 80 | Temp 98.0°F | Wt 141.5 lb

## 2011-09-25 DIAGNOSIS — R21 Rash and other nonspecific skin eruption: Secondary | ICD-10-CM

## 2011-09-25 MED ORDER — TRIAMCINOLONE ACETONIDE 0.1 % EX CREA: TOPICAL_CREAM | Freq: Two times a day (BID) | CUTANEOUS | Status: DC

## 2011-09-25 NOTE — Progress Notes (Signed)
   Nature conservation officer at [facility] [address] [place] [address] Phone: [phone] Fax: [phone]  Date:  09/25/2011   Name:  Madison Huerta   DOB:  01/14/1941   MRN:  [id number]  PCP:  [name], MD    Chief Complaint: Possible shingles   History of Present Illness:  Madison Huerta is a 71 y.o. very pleasant female patient who presents with the following:  Rash for 7-8 days, vesicular, non-painful. Itching. S/p zostavax  Past Medical History, Surgical History, Social History, Family History, Problem List, Medications, and Allergies have been reviewed and updated if relevant.  Current Outpatient Prescriptions on File Prior to Visit  Medication Sig Dispense Refill  . amLODipine (NORVASC) 5 MG tablet Take 1 tablet (5 mg total) by mouth daily.  30 tablet  11  . calcium carbonate (TUMS) 500 MG chewable tablet Chew 1 tablet by mouth as needed.        Marland Kitchen levothyroxine (SYNTHROID, LEVOTHROID) 75 MCG tablet Take 1 tablet (75 mcg total) by mouth daily.  30 tablet  6  . rosuvastatin (CRESTOR) 10 MG tablet Take 1 tablet (10 mg total) by mouth daily.  30 tablet  11  . Cholecalciferol (VITAMIN D) 1000 UNITS capsule Take 1,000 Units by mouth daily.        Marland Kitchen omeprazole (PRILOSEC OTC) 20 MG tablet Take 1 tablet (20 mg total) by mouth 2 (two) times daily.  60 tablet  11    Review of Systems: ROS: GEN: Acute illness details above GI: Tolerating PO intake GU: maintaining adequate hydration and urination Pulm: No SOB Interactive and getting along well at home.  Otherwise, ROS is as per the HPI.   Physical Examination: Filed Vitals:   09/25/11 0924  BP: 152/70  Pulse: 80  Temp: 98 F (36.7 C)   Filed Vitals:   09/25/11 0924  Weight: 141 lb 8 oz (64.184 kg)   There is no height on file to calculate BMI. Ideal Body Weight:     GEN: WDWN, NAD, Non-toxic, Alert & Oriented x 3 HEENT: Atraumatic, Normocephalic.  Ears and Nose: No external deformity. EXTR: No  clubbing/cyanosis/edema NEURO: Normal gait.  PSYCH: Normally interactive. Conversant. Not depressed or anxious appearing.  Calm demeanor.  SKIN: variable amounts of vesicular component, no purple color, excoriation  Assessment and Plan:  1. Rash    +/- if may be mild shingles, but mild vesicular rash, bid TAC, antih  Orders Today:  No orders of the defined types were placed in this encounter.    Medications Today: (Includes new updates added during medication reconciliation) Meds ordered this encounter  Medications  . triamcinolone cream (KENALOG) 0.1 %    Sig: Apply topically 2 (two) times daily.    Dispense:  454 g    Refill:  1     Hannah Beat, MD

## 2011-10-19 ENCOUNTER — Encounter: Payer: Self-pay | Admitting: Family Medicine

## 2011-11-05 ENCOUNTER — Ambulatory Visit: Payer: Self-pay | Admitting: Family Medicine

## 2011-11-06 ENCOUNTER — Encounter: Payer: Self-pay | Admitting: Family Medicine

## 2011-11-13 ENCOUNTER — Encounter: Payer: Self-pay | Admitting: *Deleted

## 2011-11-24 ENCOUNTER — Encounter: Payer: Self-pay | Admitting: Family Medicine

## 2011-11-24 ENCOUNTER — Ambulatory Visit: Payer: Self-pay | Admitting: Family Medicine

## 2011-11-25 ENCOUNTER — Telehealth: Payer: Self-pay

## 2011-11-25 DIAGNOSIS — N6310 Unspecified lump in the right breast, unspecified quadrant: Secondary | ICD-10-CM | POA: Insufficient documentation

## 2011-11-25 NOTE — Telephone Encounter (Signed)
The radiologist sees a mass in R breast and recommends a ref to a surgeon to consider biopsy I will go ahead and do that ref - let her know that Madison Huerta will be calling her

## 2011-11-25 NOTE — Telephone Encounter (Signed)
Pt request results of extra view rt breast and ultrasound done 11/24/11 at [facility].(scanned report under imaging tab).Please advise.

## 2011-11-26 ENCOUNTER — Other Ambulatory Visit: Payer: Self-pay | Admitting: Family Medicine

## 2011-11-26 DIAGNOSIS — N6310 Unspecified lump in the right breast, unspecified quadrant: Secondary | ICD-10-CM

## 2011-11-26 NOTE — Telephone Encounter (Signed)
Notified pt of radiologist recommendations for a referral and that [facility]ion will be calling her to set up appt

## 2011-12-04 ENCOUNTER — Other Ambulatory Visit: Payer: Self-pay | Admitting: Family Medicine

## 2011-12-04 ENCOUNTER — Ambulatory Visit
Admission: RE | Admit: 2011-12-04 | Discharge: 2011-12-04 | Disposition: A | Discharge status: Home / Self Care | Payer: PRIVATE HEALTH INSURANCE | Source: Ambulatory Visit | Attending: Family Medicine | Admitting: Family Medicine

## 2011-12-04 DIAGNOSIS — N6310 Unspecified lump in the right breast, unspecified quadrant: Secondary | ICD-10-CM

## 2011-12-07 ENCOUNTER — Other Ambulatory Visit: Payer: Self-pay | Admitting: Family Medicine

## 2011-12-07 ENCOUNTER — Ambulatory Visit
Admission: RE | Admit: 2011-12-07 | Discharge: 2011-12-07 | Disposition: A | Discharge status: Home / Self Care | Payer: PRIVATE HEALTH INSURANCE | Source: Ambulatory Visit | Attending: Family Medicine | Admitting: Family Medicine

## 2011-12-07 DIAGNOSIS — C50911 Malignant neoplasm of unspecified site of right female breast: Secondary | ICD-10-CM

## 2011-12-07 DIAGNOSIS — N63 Unspecified lump in unspecified breast: Secondary | ICD-10-CM

## 2011-12-11 ENCOUNTER — Ambulatory Visit
Admission: RE | Admit: 2011-12-11 | Discharge: 2011-12-11 | Disposition: A | Discharge status: Home / Self Care | Payer: PRIVATE HEALTH INSURANCE | Source: Ambulatory Visit | Attending: Family Medicine | Admitting: Family Medicine

## 2011-12-11 ENCOUNTER — Other Ambulatory Visit: Payer: Self-pay | Admitting: Oncology

## 2011-12-11 ENCOUNTER — Telehealth: Payer: Self-pay | Admitting: Oncology

## 2011-12-11 DIAGNOSIS — C50911 Malignant neoplasm of unspecified site of right female breast: Secondary | ICD-10-CM

## 2011-12-11 DIAGNOSIS — C50319 Malignant neoplasm of lower-inner quadrant of unspecified female breast: Secondary | ICD-10-CM

## 2011-12-11 DIAGNOSIS — Z853 Personal history of malignant neoplasm of breast: Secondary | ICD-10-CM

## 2011-12-11 MED ORDER — GADOBENATE DIMEGLUMINE 529 MG/ML IV SOLN: 13.0000 mL | Freq: Once | INTRAVENOUS | Status: AC | PRN

## 2011-12-11 NOTE — Telephone Encounter (Signed)
I called the patient yesterday and offered her either to be seen in the [facility] or by a surgeon upfront.   She definitely wants to be seen in clinic and I scheduled her Wednesday AM.   Patient has my number should she have any questions prior to her appt.

## 2011-12-16 ENCOUNTER — Other Ambulatory Visit (HOSPITAL_BASED_OUTPATIENT_CLINIC_OR_DEPARTMENT_OTHER): Payer: PRIVATE HEALTH INSURANCE | Admitting: Lab

## 2011-12-16 ENCOUNTER — Ambulatory Visit: Payer: PRIVATE HEALTH INSURANCE | Attending: General Surgery | Admitting: Physical Therapy

## 2011-12-16 ENCOUNTER — Ambulatory Visit (HOSPITAL_BASED_OUTPATIENT_CLINIC_OR_DEPARTMENT_OTHER): Payer: PRIVATE HEALTH INSURANCE | Admitting: Oncology

## 2011-12-16 ENCOUNTER — Ambulatory Visit
Admission: RE | Admit: 2011-12-16 | Discharge: 2011-12-16 | Disposition: A | Discharge status: Home / Self Care | Payer: PRIVATE HEALTH INSURANCE | Source: Ambulatory Visit | Attending: Radiation Oncology | Admitting: Radiation Oncology

## 2011-12-16 ENCOUNTER — Encounter: Payer: Self-pay | Admitting: *Deleted

## 2011-12-16 ENCOUNTER — Ambulatory Visit (HOSPITAL_BASED_OUTPATIENT_CLINIC_OR_DEPARTMENT_OTHER): Payer: PRIVATE HEALTH INSURANCE | Admitting: General Surgery

## 2011-12-16 ENCOUNTER — Ambulatory Visit (HOSPITAL_BASED_OUTPATIENT_CLINIC_OR_DEPARTMENT_OTHER): Payer: PRIVATE HEALTH INSURANCE

## 2011-12-16 ENCOUNTER — Encounter: Payer: Self-pay | Admitting: Oncology

## 2011-12-16 VITALS — BP 163/71 | HR 75 | Temp 98.0°F | Resp 20 | Ht 60.5 in | Wt 137.5 lb

## 2011-12-16 DIAGNOSIS — Z170 Estrogen receptor positive status [ER+]: Secondary | ICD-10-CM

## 2011-12-16 DIAGNOSIS — C50919 Malignant neoplasm of unspecified site of unspecified female breast: Secondary | ICD-10-CM | POA: Insufficient documentation

## 2011-12-16 DIAGNOSIS — C50911 Malignant neoplasm of unspecified site of right female breast: Secondary | ICD-10-CM

## 2011-12-16 DIAGNOSIS — C50119 Malignant neoplasm of central portion of unspecified female breast: Secondary | ICD-10-CM

## 2011-12-16 DIAGNOSIS — IMO0001 Reserved for inherently not codable concepts without codable children: Secondary | ICD-10-CM | POA: Insufficient documentation

## 2011-12-16 DIAGNOSIS — R293 Abnormal posture: Secondary | ICD-10-CM | POA: Insufficient documentation

## 2011-12-16 DIAGNOSIS — C50319 Malignant neoplasm of lower-inner quadrant of unspecified female breast: Secondary | ICD-10-CM

## 2011-12-16 DIAGNOSIS — M899 Disorder of bone, unspecified: Secondary | ICD-10-CM

## 2011-12-16 DIAGNOSIS — M25619 Stiffness of unspecified shoulder, not elsewhere classified: Secondary | ICD-10-CM | POA: Insufficient documentation

## 2011-12-16 DIAGNOSIS — Z853 Personal history of malignant neoplasm of breast: Secondary | ICD-10-CM

## 2011-12-16 LAB — CBC WITH DIFFERENTIAL/PLATELET
BASO%: 1.3 % (ref 0.0–2.0)
EOS%: 1.3 % (ref 0.0–7.0)
HCT: 39.7 % (ref 34.8–46.6)
LYMPH%: 23.5 % (ref 14.0–49.7)
MCH: 31 pg (ref 25.1–34.0)
MCHC: 34.1 g/dL (ref 31.5–36.0)
MCV: 90.9 fL (ref 79.5–101.0)
MONO%: 8.9 % (ref 0.0–14.0)
NEUT%: 65 % (ref 38.4–76.8)
Platelets: 264 10*3/uL (ref 145–400)

## 2011-12-16 LAB — COMPREHENSIVE METABOLIC PANEL (CC13)
AST: 26 U/L (ref 5–34)
BUN: 16 mg/dL (ref 7.0–26.0)
Calcium: 9.7 mg/dL (ref 8.4–10.4)
Chloride: 105 mEq/L (ref 98–107)
Creatinine: 0.9 mg/dL (ref 0.6–1.1)

## 2011-12-16 NOTE — Progress Notes (Signed)
Patient ID: Madison Huerta, female   DOB: 1940/07/21, 71 y.o.   MRN: [id number]  No chief complaint on file.   HPI Madison Huerta is a 71 y.o. female.  This patient is seen in our multidisciplinary breast clinic for evaluation of a newly diagnosed right breast cancer found on annual screening mammograms. She does have a history of left breast cancer discovered in 1994 and was treated with a lumpectomy and axillary dissection followed by radiation therapy in 5 years of tamoxifen but no chemotherapy was administered. She says that her lymph nodes were all negative and she is doing well from her procedure. She does do her self breast exams and has not noticed any changes and did not notice any masses in her right breast. She denies any headaches, bony pains, or abnormal weight loss. She has no other family history of breast cancer. She had a suspicious lesion on her right mammogram and followup MRI demonstrated 2 separate areas of suspicion. 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 The entire anterior to posterior length of the suspicious area was 5.7 cm in diameter. HPI  Past Medical History  Diagnosis Date  . Hyperlipidemia   . Hypertension   . Thyroid disease     Hypothyroidism  . Osteopenia   . History of breast cancer   . Overweight   . VSD (ventricular septal defect)     does need SBE prophylaxis (Keflex  3 gm 1 hour pprior and 1.5 gm 6 hours post    Past Surgical History  Procedure Date  . Breast lumpectomy 1994    Radiation  . Parathyroidectomy 12/2002  . 2d echo 2004    Normal at [facility] History  Problem Relation Age of Onset  . Aneurysm Mother     brain  . Cancer Father     Lung, Liver mets  . Heart disease Other   . Cancer Cousin     Breast CA    Social History History  Substance Use Topics  . Smoking status: Never Smoker   . Smokeless tobacco: Never Used  . Alcohol Use: Yes     1  glass of wine daily    Allergies  Allergen Reactions  . Codeine Other (See Comments)    severe headache  . Penicillins Hives    Current Outpatient Prescriptions  Medication Sig Dispense Refill  . amLODipine (NORVASC) 5 MG tablet Take 1 tablet (5 mg total) by mouth daily.  30 tablet  11  . calcium carbonate (TUMS) 500 MG chewable tablet Chew 1 tablet by mouth as needed.        . Cholecalciferol (VITAMIN D) 1000 UNITS capsule Take 1,000 Units by mouth daily.        Marland Kitchen levothyroxine (SYNTHROID, LEVOTHROID) 75 MCG tablet Take 1 tablet (75 mcg total) by mouth daily.  30 tablet  6  . omeprazole (PRILOSEC OTC) 20 MG tablet Take 1 tablet (20 mg total) by mouth 2 (two) times daily.  60 tablet  11  . rosuvastatin (CRESTOR) 10 MG tablet Take 1 tablet (10 mg total) by mouth daily.  30 tablet  11  . triamcinolone cream (KENALOG) 0.1 % Apply topically 2 (two) times daily.  454 g  1    Review of Systems Review of Systems All other review of systems negative or noncontributory except as stated in the HPI  There were no vitals taken for  this visit.  Physical Exam Physical Exam Wt Readings from Last 3 Encounters:  12/16/11 137 lb 8 oz (62.37 kg)  09/25/11 141 lb 8 oz (64.184 kg)  02/18/11 137 lb (62.143 kg)   Temp Readings from Last 3 Encounters:  12/16/11 98 F (36.7 C)   09/25/11 98 F (36.7 C) Oral  02/18/11 98.1 F (36.7 C) Oral   BP Readings from Last 3 Encounters:  12/16/11 163/71  09/25/11 152/70  02/18/11 158/72   Pulse Readings from Last 3 Encounters:  12/16/11 75  09/25/11 80  02/18/11 84  Physical Exam  Nursing note and vitals reviewed. Constitutional: She is oriented to person, place, and time. She appears well-developed and well-nourished. No distress.  HENT:  Head: Normocephalic and atraumatic.  Mouth/Throat: No oropharyngeal exudate.  Eyes: Conjunctivae and EOM are normal. Pupils are equal, round, and reactive to light. Right eye exhibits no discharge. Left eye  exhibits no discharge. No scleral icterus.  Neck: Normal range of motion. Neck supple. No tracheal deviation present.  Cardiovascular: Normal rate, regular rhythm, normal heart sounds and intact distal pulses.   Pulmonary/Chest: Effort normal and breath sounds normal. No stridor. No respiratory distress. She has no wheezes.  Abdominal: Soft. Bowel sounds are normal. She exhibits no distension and no mass. There is no tenderness. There is no rebound and no guarding.  Musculoskeletal: Normal range of motion. She exhibits no edema and no tenderness.  Neurological: She is alert and oriented to person, place, and time.  Skin: Skin is warm and dry. No rash noted. She is not diaphoretic. No erythema. No pallor.  Psychiatric: She has a normal mood and affect. Her behavior is normal. Judgment and thought content normal.  Breasts:  Her right breast as biopsy site changes at the 1:00 position as well as some bruising across the upper and outer aspect of the breast she does have a palpable 1.5 cm nodule at the 10:00 position just under some bruising about 1 inch from the nipples which is likely biopsy changes since the patient says she did not feel this previously. Otherwise there are no dominant masses or suspicious lymphadenopathy Her left breast has a transverse surgical scar with out the nipple areolar complex. She has a small palpable scar the middle portion of her prior biopsy scar but no other evidence of recurrence. She does have some dimpling of this breast. 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 She is not that large breast as well and her pathology was consistent with invasive breast cancer as well as  DCIS. We discussed the options of mastectomy with sentinel lymph node biopsy versus neoadjuvant hormones her chemotherapy followed by breast conservation therapy and sentinel lymph node biopsy. We talked about the pros and cons and the risks and benefits of each. Think that she is leaning towards mastectomy with sentinel lymph node she but she would like to speak with the oncologist as well to discuss chemotherapy options prior to making a final decision. Given the size being greater than 5 cm, she will likely need radiation regardless of the option that she chooses. She is likely going to receive genetics as well. We discussed the risks of the procedure. The risks  of infection, bleeding, pain, scarring, poor cosmesis, recurrence, need for re-excision or future surgery, nerve injury and lymphedema were discussed with the patient.     Plan    She is currently undecided if she were to proceed with mastectomy versus neoadjuvant therapy followed by breast conservation therapy. She will think about her options and let us know what she would like to proceed with.       Lodema Pilot DAVID 12/16/2011, 9:23 AM

## 2011-12-16 NOTE — Progress Notes (Signed)
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 female  who was treated for a left breast cancer with a lumpectomy radiation and axillary node dissection in 1994. She completed 5 years of tamoxifen. She was undergoing routine screening when she was found to have a right breast mass. 2 areas of concern were noted and were 2.3 cm apart. Biopsy of both these areas showed invasive ductal carcinoma. Both were strongly estrogen and progesterone receptor positive and HER-2 negative. The Ki-67 was 5% and 14%. Associated DCIS and calcifications were noted. He presents to multidisciplinary clinic today for consideration of treatment options. A MRI performed after her biopsy showed a mass measuring 1.3 x 1.0 x 0.8 cm. Linear enhancement extended from this mass measuring 5.2 x 3.0 x 1.8 cm. Focal enhancement was also noted in the medial aspect of the right nipple areolar complex. She reports no pain associated with this mass. She reports that her cosmesis is poor from her prior left lumpectomy. The time of her diagnosis she also had implants removed.  PREVIOUS RADIATION THERAPY: Yes 6 weeks of adjuvant radiation to the left breast completed in 1994 at [facility]  PAST MEDICAL HISTORY:  has a past medical history of Hyperlipidemia; Hypertension; Thyroid disease; Osteopenia; History of breast cancer; Overweight; VSD (ventricular septal defect); and Breast cancer.    PAST SURGICAL HISTORY: Past Surgical History  Procedure Date  . Breast lumpectomy 1994    Radiation  . Parathyroidectomy 12/2002  . 2d echo 2004    Normal at [facility]  . Breast implant removal 1994  . Tonsillectomy     As a child    FAMILY HISTORY: family  history includes Aneurysm in her mother; Cancer in her cousin and father; Heart disease in her other; and Lung cancer in her father.  SOCIAL HISTORY:  reports that she has never smoked. She has never used smokeless tobacco. She reports that she drinks alcohol. She reports that she does not use illicit drugs.  ALLERGIES: Codeine and Penicillins  MEDICATIONS:  Current Outpatient Prescriptions  Medication Sig Dispense Refill  . amLODipine (NORVASC) 5 MG tablet Take 1 tablet (5 mg total) by mouth daily.  30 tablet  11  . calcium carbonate (TUMS) 500 MG chewable tablet Chew 1 tablet by mouth as needed.        . Cholecalciferol (VITAMIN D) 1000 UNITS capsule Take 1,000 Units by mouth daily.        Marland Kitchen levothyroxine (SYNTHROID, LEVOTHROID) 75 MCG tablet Take 1 tablet (75 mcg total) by mouth daily.  30 tablet  6  . omeprazole (PRILOSEC OTC) 20 MG tablet Take 1 tablet (20 mg total) by mouth 2 (two) times daily.  60 tablet  11  . rosuvastatin (CRESTOR) 10 MG tablet Take 1 tablet (10 mg total) by mouth daily.  30 tablet  11  . triamcinolone cream (KENALOG) 0.1 % Apply topically 2 (two) times daily.  454 g  1    REVIEW OF SYSTEMS:  A 15 point review of systems is documented in the electronic medical record. This was obtained by the nursing staff. However, I reviewed this with the patient to discuss relevant findings and make appropriate changes.  Pertinent items are noted in HPI.  PHYSICAL EXAM:  Wt Readings from Last 3 Encounters:  12/16/11 137 lb 8 oz (62.37 kg)  09/25/11 141 lb 8 oz (64.184 kg)  02/18/11 137 lb (62.143 kg)   Temp Readings from Last 3 Encounters:  12/16/11 98 F (36.7 C)   09/25/11 98 F (36.7 C) Oral  02/18/11 98.1 F (36.7 C) Oral   BP Readings from Last 3 Encounters:  12/16/11 163/71  09/25/11 152/70  02/18/11 158/72   Pulse Readings from Last 3 Encounters:  12/16/11 75  09/25/11 80  02/18/11 84   She is a pleasant female who appears younger than her stated age.  She has no palpable supraclavicular or cervical adenopathy. She has the absence of the nipple areolar complex on the left. With no palpable abnormalities. She has a scar inferiorly on the left breast. Examination of the right breast shows biopsy change in the 9:00 position. There is palpable likely hematoma below this. She is alert and oriented x3.  LABORATORY DATA:  Lab Results  Component Value Date   WBC 6.9 12/16/2011   HGB 13.5 12/16/2011   HCT 39.7 12/16/2011   MCV 90.9 12/16/2011   PLT 264 12/16/2011   Lab Results  Component Value Date   NA 141 12/16/2011   K 3.7 12/16/2011   CL 105 12/16/2011   CO2 24 12/16/2011   Lab Results  Component Value Date   ALT 18 12/16/2011   AST 26 12/16/2011   ALKPHOS 81 12/16/2011   BILITOT 0.80 12/16/2011     RADIOGRAPHY: Mr Breast Bilateral W Wo Contrast  12/11/2011  *RADIOLOGY REPORT*  Clinical Data: The patient recently underwent both ultrasound- guided and stereotactic biopsies of the right breast.  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  The patient has a history of left breast cancer, status post lumpectomy 1994.  BUN and creatinine were obtained on site at [facility] at [address] Results:  BUN 13 mg/dL,  Creatinine 1.0 mg/dL.  BILATERAL BREAST MRI WITH AND WITHOUT CONTRAST  Technique: Multiplanar, multisequence MR images of both breasts were obtained prior to and following the intravenous administration of 13ml of Multihance.  Three dimensional images were evaluated at the independent DynaCad workstation.  Comparison:  Recent ultrasound guided core needle biopsy and stereotactic biopsy of the right breast dated 12/04/2011 from  [facility].  Bilateral diagnostic mammogram 11/05/2011, from [facility]  Findings:  There is a mild background parenchymal enhancement pattern bilaterally.  There is prominent T2 signal throughout the central right breast, extending from the just anterior to the pectoralis muscle to the nipple.  There is an irregular and indistinctly marginated enhancing mass in the posterior third of the 12 o'clock position of the right breast that corresponds to the mass biopsied under ultrasound guidance.  This mass measures roughly 1.3 x 1.0 x 0.8 cm and demonstrates washout kinetics.  Linear ductal type enhancement extends anteriorly from the mass to the nipple, and surrounds the biopsy clip in the anterior central right breast placed at the time of stereotactic biopsy for suspicious calcifications.  Suspicious enhancement in the right breast spans nearly the entire anterior to posterior length of the breast, measuring approximately 5.7 cm  AP diameter by 3.0 cm transverse diameter by 1.8 cm craniocaudal span.  There is focal enhancement of the medial aspect of the right nipple areolar complex and of the skin of just medial to the right nipple On the precontrast T2 and T1-weighted images, there is focal skin thickening and increased T2 signal in this region.  No definite evidence of invasion by tumor of the right pectoralis muscle or right chest wall.  There is no mass or suspicious enhancement in the left breast. There are lumpectomy changes of the inferior left breast.  Mild asymmetric cortical thickening of a single right axillary lymph node is not excluded.  The lymph node is small, measuring 5 mm in size.  Suggest deferring evaluation of the lymph node to sentinel lymph node biopsy.  There are three round to oval T2 bright lesions in the visualized portion of the liver.  There are no prior imaging studies of the abdomen for comparison.  There is a small to moderate sized  hiatal hernia.  IMPRESSION:  1.  Enhancing mass and ductal enhancement of the right breast are consistent with biopsy-proven malignancy (two separate areas were biopsied as described above).  The abnormal enhancement involves the majority of the anterior to posterior extent of the central breast, and there is suspicion for involvement of the skin of the nipple areolar complex and the periareolar skin of the medial right breast. 2.  5 mm right axillary lymph node demonstrates slight asymmetric cortical thickening, for which malignancy cannot be excluded. Given the small size of lymph node, suggest deferring evaluation to sentinel lymph node biopsy. 3.  Lumpectomy changes of left breast.  No evidence of malignancy in the left. 4.  Several T2 bright lesions in the liver.  No prior imaging studies of the abdomen are available for comparison.  Suggest correlation with prior outside abdominal imaging if available or consider CT of the abdomen and pelvis.  THREE-DIMENSIONAL MR IMAGE RENDERING ON INDEPENDENT WORKSTATION:  Three-dimensional MR images were rendered by post-processing of the original MR data on an independent workstation.  The three- dimensional MR images were interpreted, and findings were reported in the accompanying complete MRI report for this study.  BI-RADS CATEGORY 6:  Known biopsy-proven malignancy - appropriate action should be taken.   Original Report Authenticated By: Britta Mccreedy, M.D.    [place] Core Biopsy  12/04/2011  *RADIOLOGY REPORT*  Clinical Data:  Right breast mass  ULTRASOUND GUIDED CORE BIOPSY OF THE right BREAST  Comparison: Previous exams.  I met with the patient and we discussed the procedure of ultrasound- guided biopsy, including benefits and alternatives.  We discussed the high likelihood of a successful procedure. We discussed the risks of the procedure, including infection, bleeding, tissue injury, clip migration, and inadequate sampling.  Informed written consent was given.   Using sterile technique  lidocaine, ultrasound guidance and a 14 gauge automated biopsy device, biopsy was performed of hypoechoic mass right breast 12 o'clock position using a medial approach.  At the conclusion of the procedure a ribbon tissue marker clip was deployed into the biopsy cavity.  Follow up 2 view mammogram was performed and dictated separately.  IMPRESSION: Ultrasound guided biopsy of right breast.  No apparent complications.   Original Report Authenticated By: Sherian Rein, M.D.    Mm Breast Stereo Biopsy Right  12/10/2011  **ADDENDUM** CREATED: 12/04/2011 10:02:35  Post biopsy CC and lateral right mammograms demonstrates a ribbon shaped biopsy clip in the mass of concern in the right breast  12 o'clock.  There is a dumbbell shaped biopsy clip in the area of stereo biopsy area of concern.  Addended by:  Sherian Rein, M.D. on 12/04/2011 10:02:35.  **END ADDENDUM** SIGNED BY: Sherian Rein, M.D.   12/04/2011  **ADDENDUM** CREATED: 12/04/2011 10:02:35  Post biopsy CC and lateral right mammograms demonstrates a ribbon shaped biopsy clip in the mass of concern in the right breast 12 o'clock.  There is a dumbbell shaped biopsy clip in the area of stereo biopsy area of concern.  Addended by:  Sherian Rein, M.D. on 12/04/2011 10:02:35.  **END ADDENDUM** SIGNED BY: Sherian Rein, M.D.   12/04/2011  *RADIOLOGY REPORT*  Clinical Data:  Suspicious calcifications right breast  STEREOTACTIC-GUIDED VACUUM ASSISTED BIOPSY OF THE RIGHT BREAST AND SPECIMEN RADIOGRAPH  Comparison: Previous exams.  I met with the patient and we discussed the procedure of stereotactic-guided biopsy, including benefits and alternatives. We discussed the high likelihood of a successful procedure. We discussed the risks of the procedure, including infection, bleeding, tissue injury, clip migration, and inadequate sampling. Informed, written consent was given.  Using sterile technique, 2% lidocaine, stereotactic guidance, and a 9  gauge vacuum assisted device, biopsy was performed of a cluster of calcifications right breast using a medial approach.  Specimen radiograph was performed, showing inclusion of calcification of concern within the specimen.  Specimens with calcifications are identified for pathology.  At the conclusion of the procedure, a the dumbbell shaped tissue marker clip was deployed into the biopsy cavity.  Follow-up 2-view mammogram confirmed clip in the area of concern.  IMPRESSION: Stereotactic-guided biopsy of right breast.  No apparent complications.   Original Report Authenticated By: Sherian Rein, M.D.    Mm Breast Surgical Specimen  12/04/2011  **ADDENDUM** CREATED: 12/04/2011 10:02:35  Post biopsy CC and lateral right mammograms demonstrates a ribbon shaped biopsy clip in the mass of concern in the right breast 12 o'clock.  There is a dumbbell shaped biopsy clip in the area of stereo biopsy area of concern.  Addended by:  Sherian Rein, M.D. on 12/04/2011 10:02:35.  **END ADDENDUM** SIGNED BY: Sherian Rein, M.D.   12/04/2011  *RADIOLOGY REPORT*  Clinical Data:  Suspicious calcifications right breast  STEREOTACTIC-GUIDED VACUUM ASSISTED BIOPSY OF THE RIGHT BREAST AND SPECIMEN RADIOGRAPH  Comparison: Previous exams.  I met with the patient and we discussed the procedure of stereotactic-guided biopsy, including benefits and alternatives. We discussed the high likelihood of a successful procedure. We discussed the risks of the procedure, including infection, bleeding, tissue injury, clip migration, and inadequate sampling. Informed, written consent was given.  Using sterile technique, 2% lidocaine, stereotactic guidance, and a 9 gauge vacuum assisted device, biopsy was performed of a cluster of calcifications right breast using a medial approach.  Specimen radiograph was performed, showing inclusion of calcification of concern within the specimen.  Specimens with calcifications are identified for pathology.  At the  conclusion of the procedure, a the dumbbell shaped tissue marker clip was deployed into the biopsy cavity.  Follow-up 2-view mammogram confirmed clip in the area of concern.  IMPRESSION: Stereotactic-guided biopsy of right breast.  No apparent complications.   Original Report Authenticated By: Sherian Rein, M.D.    Mm Radiologist Eval And Mgmt  12/07/2011  *RADIOLOGY REPORT*  ESTABLISHED PATIENT OFFICE VISIT - LEVEL II [phone])  Chief Complaint:  Ultrasound and stereotactic right breast biopsies.  Abnormal mammogram, right breast.  History:  The patient had a stereotactic and ultrasound guided biopsy for suspicious  calcifications and mass in the 12 o'clock position of the right breast.  The patient has a history of left sided breast cancer multiple years prior.  Exam:   The patient has a 5 cm hematoma in the upper aspect of the right breast.  The biopsy site is clean and dry.  The patient reports mild tenderness.  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  Assessment and Plan:  A breast MRI is scheduled on 12/11/2011 at 11:30 a.m.  The patient will be contacted by Multidisciplinary Clinic and an appointment will be made.   Original Report Authenticated By: Hiram Gash, M.D.       IMPRESSION: T3 N0 invasive ductal carcinoma of the right breast with a history of prior right left breast cancer  PLAN: I spoke with Milady and her husband regarding her diagnosis and options for treatment. We discussed the equivalency in terms of survival between mastectomy and breast conservation. We discussed that given her breast size versus tumor ratio that a lumpectomy at this point would practically be a mastectomy a result and very poor cosmesis. For that reason we have recommended either neoadjuvant treatment followed by lumpectomy and radiation versus up front mastectomy. I let her know that the indications for  radiation after mastectomy her tumor size greater than 5 cm positive margins are positive lymph nodes. At this point I believe her MRI does show the lesion to be greater than 5 cm however some of this could be DCIS. She has an upfront mastectomy I would ask to delay reconstruction so the weekly get a good idea of what her tumor size is. Hopefully it will only be that small 1-2 cm area seen on MRI and breast will be DCIS. If she does all let her neoadjuvant treatment and is able to have a lumpectomy she would require adjuvant radiation. We discussed the process of simulation the placement tattoos. She is of course very familiar with this. If she does undergo neoadjuvant treatment and ultimately chooses to have a mastectomy I would recommend postmastectomy radiation as we have to treat based on the pretreatment tumor characteristics. I discussed this with her and her husband. She would like to think about her options and will let us know. She met with surgery, medical oncology, a member of our patient family support staff as well as with our physical therapist. I think she is leaning towards upfront mastectomy we discussed that if she did need radiation and began about 4 weeks after surgery.  I spent 60 minutes  face to face with the patient and more than 50% of that time was spent in counseling and/or coordination of care.   ------------------------------------------------  Lurline Hare, MD

## 2011-12-16 NOTE — Progress Notes (Signed)
Checked in new patient. No financial issues at this time.

## 2011-12-17 ENCOUNTER — Encounter: Payer: Self-pay | Admitting: *Deleted

## 2011-12-17 NOTE — Progress Notes (Signed)
[facility] Psychosocial Distress Screening Clinical Social Work  Patient completed distress screening protocol, and scored a 6 on the Psychosocial Distress Thermometer which indicates moderate distress. Clinical Social Worker met with pt in [facility] to assess for distress and other psychosocial needs.  Pt stated she felt much better after speaking with the physicians and getting information on her treatment plan.  CSW informed pt of the patient and family support team and support services at [facility].  CSW encouraged pt to call with any questions or concerns.      Clinical Social Worker follow up needed: not at this time  Tamala Julian, MSW, LCSW Clinical Social Worker [facility] [phone]

## 2011-12-18 ENCOUNTER — Telehealth: Payer: Self-pay | Admitting: *Deleted

## 2011-12-18 NOTE — Telephone Encounter (Signed)
Spoke to pt concerning [facility] from 12/16/11.  Pt relay she has decided to have mastectomy instead of proceeding with neoadjuvant study.  I have informed physician team.  I discussed plan of care.  Pt denies questions concerning dx.  Informed pt she will be receiving a call from Dr. Delice Lesch office with her surgery date.  Encourage pt to call with further needs or concerns.  Received verbal understanding.  Contact information given.

## 2011-12-22 NOTE — Progress Notes (Signed)
Madison Huerta [id number] Nov 06, 1940 71 y.o. 12/22/2011 5:44 AM  CC   [name], MD [address] [place] [address], [place] [address]  REASON FOR CONSULTATION:    Patient was seen in the Multidisciplinary Breast Clinic for discussion of her treatment options. She was seen by Dr. Pierce Crane  [facility]  STAGE:   Carcinoma of lower-inner quadrant of breast   Primary site: Breast (Right)   Staging method: AJCC 7th Edition   Clinical: Stage IIB (T3, N0, cM0)   Summary: Stage IIB (T3, N0, cM0)  REFERRING PHYSICIAN:    HISTORY OF PRESENT ILLNESS:  Madison Huerta is a 71 y.o. female.   From [facility] who has undergone annual screening mammography. A diagnostic mammogram performed 11/05/2011 showed a suspicious asymmetry in the right breast. Additional views and ultrasounds performed on 11/24/2011. This showed a hypoechoic mass 12:00 position measuring 1.3 x 0.9 x 0.8 cm. A smaller mass was seen adjacent to this measuring 0.9 x 0.5 x 0.3 cm. Core biopsies performed on the right breast on 12/04/2011. 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 Proliferative index as 5%. The second area was identified and also biopsy with similar results. MRI scan of both breasts performed 12/11/2011 showed enhancing mass right breast measuring in total area of 5.7 x 3.0 x 1.8 cm. Some focal skin thickening was seen as well. Of note is that she's had previous breast cancer 1984 with lumpectomy node dissection on the left breast. She completed 5 years of tamoxifen. He was previously seen at [facility].  Past Medical History:  Past Medical History  Diagnosis Date  . Hyperlipidemia   . Hypertension   . Thyroid disease     Hypothyroidism  . Osteopenia   . History of breast cancer   . Overweight   . VSD (ventricular septal defect)     does need SBE prophylaxis (Keflex  3 gm 1 hour pprior and 1.5 gm 6 hours post  . Breast cancer     Past Surgical History:  Past Surgical History  Procedure Date  . Breast lumpectomy 1994    Radiation  . Parathyroidectomy 12/2002  . 2d echo 2004    Normal at [facility]  . Breast implant removal 1994  . Tonsillectomy     As a child    Family History:  Family History  Problem Relation Age of Onset  . Aneurysm Mother     brain  . Cancer Father     Lung, Liver mets  . Lung cancer Father   . Heart disease Other   . Cancer Cousin     Breast CA    Social History  History  Substance Use Topics  . Smoking status: Never Smoker   . Smokeless tobacco: Never Used  . Alcohol Use: Yes     1 glass of wine daily  she is really from [place] has been [place] for 7 years and is retired. She has been married 47 years. She has 2 children one in [place] when [place] and 3 grandchildren.  Allergies:  Allergies  Allergen Reactions  . Codeine Other (See Comments)    severe headache  . Penicillins Hives    Current Medications:  Current Outpatient Prescriptions  Medication Sig Dispense Refill  . amLODipine (NORVASC) 5 MG tablet Take 1 tablet (5 mg total) by mouth daily.  30 tablet  11  . calcium carbonate (TUMS) 500 MG chewable tablet Chew 1 tablet by mouth as needed.        . Cholecalciferol (VITAMIN D) 1000 UNITS capsule Take 1,000 Units by mouth daily.        Marland Kitchen levothyroxine (SYNTHROID, LEVOTHROID) 75 MCG tablet Take 1 tablet (75 mcg total) by mouth daily.  30 tablet  6  . omeprazole (PRILOSEC OTC) 20 MG tablet Take 1 tablet (20 mg total) by mouth 2 (two) times daily.  60 tablet  11  . rosuvastatin (CRESTOR) 10 MG tablet Take 1 tablet (10 mg total) by mouth daily.  30 tablet  11  . triamcinolone cream (KENALOG) 0.1 % Apply topically 2 (two) times daily.  454 g  1    OB/GYN History:  G3 P2, menarche age 24, menopause age41 hormone replacement therapy x x5 years  Fertility  Discussion: no Prior History of Cancer: yes  Health Maintenance:  Colonoscopy y Bone Density y  Last PAP smear y  ECOG PERFORMANCE STATUS: 0 - Asymptomatic  Genetic Counseling/testing: has been referred  REVIEW OF SYSTEMS:  A comprehensive review of systems was negative.  PHYSICAL EXAMINATION: Blood pressure 163/71, pulse 75, temperature 98 F (36.7 C), resp. rate 20, height 5' 0.5" (1.537 m), weight 137 lb 8 oz (62.37 kg).  HEENT:  Sclerae anicteric, conjunctivae pink.  Oropharynx clear.  No mucositis or candidiasis.  Nodes:  No cervical, supraclavicular, or axillary lymphadenopathy palpated.  Breast Exam:  Right breast has a vague mass in the central portion. There is also hematoma. benign.  .  Left breast is status post lumpectomy with absent nipple areolar complex.  Lungs:  Clear to auscultation bilaterally.  No crackles, rhonchi, or wheezes.  Heart:  Regular rate and rhythm., loud, 3/6  holosystolic murmur heard best at lower portion of left sternal border  Abdomen:  Soft, nontender.  Positive bowel sounds.  No organomegaly or masses palpated.  Musculoskeletal:  No focal spinal tenderness to palpation.  Extremities:  Benign.  No peripheral edema or cyanosis.  Skin:  Benign.  Neuro:  Nonfocal.      STUDIES/RESULTS: Mr Breast Bilateral W Wo Contrast  12/11/2011  *RADIOLOGY REPORT*  Clinical Data: The patient recently underwent both ultrasound- guided and stereotactic biopsies of the right breast.  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  The patient has a history of left breast cancer, status post lumpectomy 1994.  BUN and creatinine were obtained on site at [facility] at [address] Results:  BUN 13 mg/dL,  Creatinine 1.0 mg/dL.  BILATERAL BREAST MRI WITH AND WITHOUT CONTRAST  Technique: Multiplanar, multisequence MR images of both breasts were obtained prior to and following the intravenous administration of 13ml of Multihance.  Three dimensional images were evaluated at the independent DynaCad workstation.  Comparison:  Recent ultrasound guided core needle biopsy and stereotactic biopsy of the right breast dated 12/04/2011 [facility].  Bilateral diagnostic mammogram 11/05/2011, from [facility]  Findings:  There is a mild background parenchymal enhancement pattern bilaterally.  There is prominent T2 signal throughout the central right breast, extending from the just anterior to the pectoralis muscle to  the nipple.  There is an irregular and indistinctly marginated enhancing mass in the posterior third of the 12 o'clock position of the right breast that corresponds to the mass biopsied under ultrasound guidance.  This mass measures roughly 1.3 x 1.0 x 0.8 cm and demonstrates washout kinetics.  Linear ductal type enhancement extends anteriorly from the mass to the nipple, and surrounds the biopsy clip in the anterior central right breast placed at the time of stereotactic biopsy for suspicious calcifications.  Suspicious enhancement in the right breast spans nearly the entire anterior to posterior length of the breast, measuring approximately 5.7 cm AP diameter by 3.0 cm transverse diameter by 1.8 cm craniocaudal span.  There is focal enhancement of the medial aspect of the right nipple areolar complex and of the skin of just medial to the right nipple On the precontrast T2 and T1-weighted images, there is focal skin thickening and increased T2 signal in this region.  No definite evidence of invasion by tumor of the right pectoralis muscle or right chest wall.  There is no mass or suspicious enhancement in the left breast.  There are lumpectomy changes of the inferior left breast.  Mild asymmetric cortical thickening of a single right axillary lymph node is not excluded.  The lymph node is small, measuring 5 mm in size.  Suggest deferring evaluation of the lymph node to sentinel lymph node biopsy.  There are three round to oval T2 bright lesions in the visualized portion of the liver.  There are no prior imaging studies of the abdomen for comparison.  There is a small to moderate sized hiatal hernia.  IMPRESSION:  1.  Enhancing mass and ductal enhancement of the right breast are consistent with biopsy-proven malignancy (two separate areas were biopsied as described above).  The abnormal enhancement involves the majority of the anterior to posterior extent of the central breast, and there is suspicion for involvement of the skin of the nipple areolar complex and the periareolar skin of the medial right breast. 2.  5 mm right axillary lymph node demonstrates slight asymmetric cortical thickening, for which malignancy cannot be excluded. Given the small size of lymph node, suggest deferring evaluation to sentinel lymph node biopsy. 3.  Lumpectomy changes of left breast.  No evidence of malignancy in the left. 4.  Several T2 bright lesions in the liver.  No prior imaging studies of the abdomen are available for comparison.  Suggest correlation with prior outside abdominal imaging if available or consider CT of the abdomen and pelvis.  THREE-DIMENSIONAL MR IMAGE RENDERING ON INDEPENDENT WORKSTATION:  Three-dimensional MR images were rendered by post-processing of the original MR data on an independent workstation.  The three- dimensional MR images were interpreted, and findings were reported in the accompanying complete MRI report for this study.  BI-RADS CATEGORY 6:  Known biopsy-proven malignancy - appropriate action should be taken.   Original Report Authenticated By: Britta Mccreedy, M.D.    [place] Core Biopsy  12/04/2011  *RADIOLOGY  REPORT*  Clinical Data:  Right breast mass  ULTRASOUND GUIDED CORE BIOPSY OF THE right BREAST  Comparison: Previous exams.  I met with the patient and we discussed the procedure of ultrasound- guided biopsy, including benefits and alternatives.  We discussed the high likelihood of a successful procedure. We discussed the risks of the procedure, including infection, bleeding, tissue injury, clip migration, and inadequate sampling.  Informed written consent was given.  Using sterile technique  lidocaine, ultrasound guidance and a 14 gauge automated  biopsy device, biopsy was performed of hypoechoic mass right breast 12 o'clock position using a medial approach.  At the conclusion of the procedure a ribbon tissue marker clip was deployed into the biopsy cavity.  Follow up 2 view mammogram was performed and dictated separately.  IMPRESSION: Ultrasound guided biopsy of right breast.  No apparent complications.   Original Report Authenticated By: Sherian Rein, M.D.    Mm Breast Stereo Biopsy Right  12/10/2011  **ADDENDUM** CREATED: 12/04/2011 10:02:35  Post biopsy CC and lateral right mammograms demonstrates a ribbon shaped biopsy clip in the mass of concern in the right breast 12 o'clock.  There is a dumbbell shaped biopsy clip in the area of stereo biopsy area of concern.  Addended by:  Sherian Rein, M.D. on 12/04/2011 10:02:35.  **END ADDENDUM** SIGNED BY: Sherian Rein, M.D.   12/04/2011  **ADDENDUM** CREATED: 12/04/2011 10:02:35  Post biopsy CC and lateral right mammograms demonstrates a ribbon shaped biopsy clip in the mass of concern in the right breast 12 o'clock.  There is a dumbbell shaped biopsy clip in the area of stereo biopsy area of concern.  Addended by:  Sherian Rein, M.D. on 12/04/2011 10:02:35.  **END ADDENDUM** SIGNED BY: Sherian Rein, M.D.   12/04/2011  *RADIOLOGY REPORT*  Clinical Data:  Suspicious calcifications right breast  STEREOTACTIC-GUIDED VACUUM ASSISTED BIOPSY OF THE RIGHT BREAST  AND SPECIMEN RADIOGRAPH  Comparison: Previous exams.  I met with the patient and we discussed the procedure of stereotactic-guided biopsy, including benefits and alternatives. We discussed the high likelihood of a successful procedure. We discussed the risks of the procedure, including infection, bleeding, tissue injury, clip migration, and inadequate sampling. Informed, written consent was given.  Using sterile technique, 2% lidocaine, stereotactic guidance, and a 9 gauge vacuum assisted device, biopsy was performed of a cluster of calcifications right breast using a medial approach.  Specimen radiograph was performed, showing inclusion of calcification of concern within the specimen.  Specimens with calcifications are identified for pathology.  At the conclusion of the procedure, a the dumbbell shaped tissue marker clip was deployed into the biopsy cavity.  Follow-up 2-view mammogram confirmed clip in the area of concern.  IMPRESSION: Stereotactic-guided biopsy of right breast.  No apparent complications.   Original Report Authenticated By: Sherian Rein, M.D.    Mm Breast Surgical Specimen  12/04/2011  **ADDENDUM** CREATED: 12/04/2011 10:02:35  Post biopsy CC and lateral right mammograms demonstrates a ribbon shaped biopsy clip in the mass of concern in the right breast 12 o'clock.  There is a dumbbell shaped biopsy clip in the area of stereo biopsy area of concern.  Addended by:  Sherian Rein, M.D. on 12/04/2011 10:02:35.  **END ADDENDUM** SIGNED BY: Sherian Rein, M.D.   12/04/2011  *RADIOLOGY REPORT*  Clinical Data:  Suspicious calcifications right breast  STEREOTACTIC-GUIDED VACUUM ASSISTED BIOPSY OF THE RIGHT BREAST AND SPECIMEN RADIOGRAPH  Comparison: Previous exams.  I met with the patient and we discussed the procedure of stereotactic-guided biopsy, including benefits and alternatives. We discussed the high likelihood of a successful procedure. We discussed the risks of the procedure, including  infection, bleeding, tissue injury, clip migration, and inadequate sampling. Informed, written consent was given.  Using sterile technique, 2% lidocaine, stereotactic guidance, and a 9 gauge vacuum assisted device, biopsy was performed of a cluster of calcifications right breast using a medial approach.  Specimen radiograph was performed, showing inclusion of calcification of concern within the specimen.  Specimens with calcifications are  identified for pathology.  At the conclusion of the procedure, a the dumbbell shaped tissue marker clip was deployed into the biopsy cavity.  Follow-up 2-view mammogram confirmed clip in the area of concern.  IMPRESSION: Stereotactic-guided biopsy of right breast.  No apparent complications.   Original Report Authenticated By: Sherian Rein, M.D.    Mm Radiologist Eval And Mgmt  12/07/2011  *RADIOLOGY REPORT*  ESTABLISHED PATIENT OFFICE VISIT - LEVEL II [phone])  Chief Complaint:  Ultrasound and stereotactic right breast biopsies.  Abnormal mammogram, right breast.  History:  The patient had a stereotactic and ultrasound guided biopsy for suspicious calcifications and mass in the 12 o'clock position of the right breast.  The patient has a history of left sided breast cancer multiple years prior.  Exam:   The patient has a 5 cm hematoma in the upper aspect of the right breast.  The biopsy site is clean and dry.  The patient reports mild tenderness.  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  Assessment and Plan:  A breast MRI is scheduled on 12/11/2011 at 11:30 a.m.  The patient will be contacted by Multidisciplinary Clinic and an appointment will be made.   Original Report Authenticated By: Hiram Gash, M.D.      LABS:    Chemistry      Component Value Date/Time   NA 141 12/16/2011 0828   NA 143 02/18/2011 1157   K 3.7 12/16/2011 0828   K 4.2 02/18/2011 1157    CL 105 12/16/2011 0828   CL 106 02/18/2011 1157   CO2 24 12/16/2011 0828   CO2 27 02/18/2011 1157   BUN 16.0 12/16/2011 0828   BUN 18 02/18/2011 1157   CREATININE 0.9 12/16/2011 0828   CREATININE 1.1 02/18/2011 1157      Component Value Date/Time   CALCIUM 9.7 12/16/2011 0828   CALCIUM 9.4 02/18/2011 1157   ALKPHOS 81 12/16/2011 0828   ALKPHOS 60 02/18/2011 1157   AST 26 12/16/2011 0828   AST 25 02/18/2011 1157   ALT 18 12/16/2011 0828   ALT 15 02/18/2011 1157   BILITOT 0.80 12/16/2011 0828   BILITOT 0.5 02/18/2011 1157      Lab Results  Component Value Date   WBC 6.9 12/16/2011   HGB 13.5 12/16/2011   HCT 39.7 12/16/2011   MCV 90.9 12/16/2011   PLT 264 12/16/2011       PATHOLOGY:ER/PR positive low-grade breast cancer  ASSESSMENT    This patient has a previous history of breast cancer. She is a fairly large area of involvement in the contralateral breast.we'll long discussion about neoadjuvant treatment and its risks and benefits. She is not essentially committed to keeping her breast. We also discussed what research studies involving the possibly down staging her tumor.  Clinical Trial Eligibility: yes Multidisciplinary conference discussion yes    PLAN:     once again we discussed neoadjuvant treatment. We discussed risks and benefits. She will consider this study. If not then I think she will likely go ahead with mastectomy. I will plan to see her after she makes a final decision. If she does undergo a surgery up front we would likely test the tumor of the Oncotype to determine her eligibility for chemotherapy.       Discussion: Patient is being treated per NCCN breast cancer care guidelines appropriate for stage.3   Thank you so much for allowing me to participate  in the care of Madison Huerta. I will continue to follow up the patient with you and assist in her care.  All questions were answered. The patient knows to call the clinic with any problems,  questions or concerns. We can certainly see the patient much sooner if necessary.  I spent 30 minutes counseling the patient face to face. The total time spent in the appointment was 55 minutes. Mervin Hack M.D. FRCP C. 12/22/2011, 5:44 AM

## 2011-12-23 ENCOUNTER — Other Ambulatory Visit (INDEPENDENT_AMBULATORY_CARE_PROVIDER_SITE_OTHER): Payer: Self-pay | Admitting: General Surgery

## 2011-12-23 ENCOUNTER — Telehealth (INDEPENDENT_AMBULATORY_CARE_PROVIDER_SITE_OTHER): Payer: Self-pay

## 2011-12-23 DIAGNOSIS — C50919 Malignant neoplasm of unspecified site of unspecified female breast: Secondary | ICD-10-CM

## 2011-12-23 NOTE — Telephone Encounter (Signed)
Return call to patient:  Patient has decided to proceed with mastectomy, Dr. Biagio Quint aware.  Will call patient to discuss in further detail once surgical orders are placed.

## 2011-12-23 NOTE — Telephone Encounter (Signed)
Paged Dr. Biagio Quint re:  Patient would like to proceed with mastectomy.

## 2011-12-25 ENCOUNTER — Telehealth (INDEPENDENT_AMBULATORY_CARE_PROVIDER_SITE_OTHER): Payer: Self-pay

## 2011-12-25 NOTE — Telephone Encounter (Signed)
Spoke to Ms. Stooksbury regarding her surgery date.  Patient aware our office will call with date & time for surgery.

## 2011-12-29 ENCOUNTER — Encounter (HOSPITAL_COMMUNITY): Payer: Self-pay | Admitting: Pharmacist

## 2011-12-29 ENCOUNTER — Other Ambulatory Visit (INDEPENDENT_AMBULATORY_CARE_PROVIDER_SITE_OTHER): Payer: Self-pay | Admitting: General Surgery

## 2011-12-29 DIAGNOSIS — C50919 Malignant neoplasm of unspecified site of unspecified female breast: Secondary | ICD-10-CM

## 2011-12-31 NOTE — Pre-Procedure Instructions (Signed)
20 Madison Huerta  12/31/2011   Your procedure is scheduled on:  Friday, November 15th.  Report to [facility] at 8:30AM.  Call this number if you have problems the morning of surgery: [phone]   Remember:   Do not eat food or drink any liquid:After Midnight.       Take these medicines the morning of surgery with A SIP OF WATER: Amlodipine (Norvasc), Levothyroxine (Synthroid) and Omeprazole (Prilosec).   Do not wear jewelry, make-up or nail polish.  Do not wear lotions, powders, or perfumes. You may wear deodorant.  Do not shave 48 hours prior to surgery. Men may shave face and neck.  Do not bring valuables to the hospital.  Contacts, dentures or bridgework may not be worn into surgery.  Leave suitcase in the car. After surgery it may be brought to your room.  For patients admitted to the hospital, checkout time is 11:00 AM the day of discharge.   Patients discharged the day of surgery will not be allowed to drive home.  Name and phone number of your driver: NA  Special Instructions: Shower using CHG 2 nights before surgery and the night before surgery.  If you shower the day of surgery use CHG.  Use special wash - you have one bottle of CHG for all showers.  You should use approximately 1/3 of the bottle for each shower.   Please read over the following fact sheets that you were given: Pain Booklet, Coughing and Deep Breathing and Surgical Site Infection Prevention

## 2012-01-01 ENCOUNTER — Encounter (HOSPITAL_COMMUNITY)
Admission: RE | Admit: 2012-01-01 | Discharge: 2012-01-01 | Disposition: A | Discharge status: Home / Self Care | Payer: Medicare Other | Source: Ambulatory Visit | Attending: General Surgery | Admitting: General Surgery

## 2012-01-01 ENCOUNTER — Encounter (HOSPITAL_COMMUNITY): Payer: Self-pay

## 2012-01-01 VITALS — BP 142/78 | HR 77 | Temp 98.5°F | Resp 20 | Ht 61.0 in | Wt 133.4 lb

## 2012-01-01 DIAGNOSIS — C50919 Malignant neoplasm of unspecified site of unspecified female breast: Secondary | ICD-10-CM

## 2012-01-01 HISTORY — DX: Hypothyroidism, unspecified: E03.9

## 2012-01-01 HISTORY — DX: Cardiac murmur, unspecified: R01.1

## 2012-01-01 HISTORY — DX: Gastro-esophageal reflux disease without esophagitis: K21.9

## 2012-01-01 LAB — BASIC METABOLIC PANEL
BUN: 12 mg/dL (ref 6–23)
Calcium: 10.1 mg/dL (ref 8.4–10.5)
Creatinine, Ser: 0.88 mg/dL (ref 0.50–1.10)
GFR calc non Af Amer: 66 mL/min — ABNORMAL LOW (ref >90)
Glucose, Bld: 104 mg/dL — ABNORMAL HIGH (ref 70–99)
Potassium: 3.9 mEq/L (ref 3.5–5.1)

## 2012-01-01 LAB — CBC
Hemoglobin: 14.5 g/dL (ref 12.0–15.0)
MCH: 30.5 pg (ref 26.0–34.0)
MCHC: 33.8 g/dL (ref 30.0–36.0)
RDW: 13 % (ref 11.5–15.5)

## 2012-01-01 LAB — SURGICAL PCR SCREEN: MRSA, PCR: NEGATIVE

## 2012-01-04 ENCOUNTER — Encounter: Payer: Self-pay | Admitting: *Deleted

## 2012-01-05 ENCOUNTER — Telehealth: Payer: Self-pay | Admitting: *Deleted

## 2012-01-05 NOTE — Telephone Encounter (Signed)
Patient's husband confirmed over the phone the new date and time on 02-09-2012 starting at 12:00pm

## 2012-01-06 ENCOUNTER — Other Ambulatory Visit: Payer: Self-pay | Admitting: Family Medicine

## 2012-01-07 MED ORDER — CLINDAMYCIN PHOSPHATE 900 MG/50ML IV SOLN
900.0000 mg | Freq: Once | INTRAVENOUS | Status: AC
  Administered 2012-01-08: 600 mg via INTRAVENOUS
  Filled 2012-01-07 (×3): qty 50

## 2012-01-07 MED ORDER — HEPARIN SODIUM (PORCINE) 5000 UNIT/ML IJ SOLN
5000.0000 [IU] | Freq: Once | INTRAMUSCULAR | Status: AC
  Administered 2012-01-08: 5000 [IU] via SUBCUTANEOUS
  Filled 2012-01-07: qty 1

## 2012-01-08 ENCOUNTER — Encounter (HOSPITAL_COMMUNITY): Payer: Self-pay | Admitting: Certified Registered Nurse Anesthetist

## 2012-01-08 ENCOUNTER — Observation Stay (HOSPITAL_COMMUNITY)
Admission: RE | Admit: 2012-01-08 | Discharge: 2012-01-09 | Disposition: A | Discharge status: Home / Self Care | Payer: Medicare Other | Source: Ambulatory Visit | Attending: General Surgery | Admitting: General Surgery

## 2012-01-08 ENCOUNTER — Encounter (HOSPITAL_COMMUNITY)
Admission: RE | Admit: 2012-01-08 | Discharge: 2012-01-08 | Disposition: A | Discharge status: Home / Self Care | Payer: Medicare Other | Source: Ambulatory Visit | Attending: General Surgery | Admitting: General Surgery

## 2012-01-08 ENCOUNTER — Encounter (HOSPITAL_COMMUNITY): Payer: Self-pay | Admitting: *Deleted

## 2012-01-08 ENCOUNTER — Encounter (HOSPITAL_COMMUNITY)
Admission: RE | Disposition: A | Discharge status: Home / Self Care | Payer: Self-pay | Source: Ambulatory Visit | Attending: General Surgery

## 2012-01-08 ENCOUNTER — Ambulatory Visit (HOSPITAL_COMMUNITY): Payer: Medicare Other | Admitting: Certified Registered Nurse Anesthetist

## 2012-01-08 DIAGNOSIS — D0590 Unspecified type of carcinoma in situ of unspecified breast: Secondary | ICD-10-CM

## 2012-01-08 DIAGNOSIS — C50319 Malignant neoplasm of lower-inner quadrant of unspecified female breast: Principal | ICD-10-CM | POA: Insufficient documentation

## 2012-01-08 DIAGNOSIS — Z79899 Other long term (current) drug therapy: Secondary | ICD-10-CM | POA: Insufficient documentation

## 2012-01-08 DIAGNOSIS — Z853 Personal history of malignant neoplasm of breast: Secondary | ICD-10-CM | POA: Insufficient documentation

## 2012-01-08 DIAGNOSIS — C50919 Malignant neoplasm of unspecified site of unspecified female breast: Secondary | ICD-10-CM | POA: Insufficient documentation

## 2012-01-08 DIAGNOSIS — E785 Hyperlipidemia, unspecified: Secondary | ICD-10-CM | POA: Insufficient documentation

## 2012-01-08 DIAGNOSIS — Q210 Ventricular septal defect: Secondary | ICD-10-CM | POA: Insufficient documentation

## 2012-01-08 DIAGNOSIS — I10 Essential (primary) hypertension: Secondary | ICD-10-CM | POA: Insufficient documentation

## 2012-01-08 HISTORY — PX: MASTECTOMY: SHX3

## 2012-01-08 HISTORY — PX: MASTECTOMY W/ SENTINEL NODE BIOPSY: SHX2001

## 2012-01-08 SURGERY — MASTECTOMY WITH SENTINEL LYMPH NODE BIOPSY
Anesthesia: General | Site: Breast | Laterality: Right | Wound class: Clean

## 2012-01-08 MED ORDER — PROPOFOL 10 MG/ML IV BOLUS
INTRAVENOUS | Status: DC | PRN
  Administered 2012-01-08: 110 mg via INTRAVENOUS

## 2012-01-08 MED ORDER — OXYCODONE HCL 5 MG PO TABS: 5.0000 mg | ORAL_TABLET | Freq: Once | ORAL | Status: DC | PRN

## 2012-01-08 MED ORDER — LIDOCAINE-EPINEPHRINE (PF) 1 %-1:200000 IJ SOLN
INTRAMUSCULAR | Status: AC
  Filled 2012-01-08: qty 10

## 2012-01-08 MED ORDER — CLINDAMYCIN PHOSPHATE 600 MG/50ML IV SOLN: 600.0000 mg | Freq: Once | INTRAVENOUS | Status: DC

## 2012-01-08 MED ORDER — MORPHINE SULFATE 2 MG/ML IJ SOLN: 1.0000 mg | INTRAMUSCULAR | Status: DC | PRN

## 2012-01-08 MED ORDER — HYDROCODONE-ACETAMINOPHEN 5-325 MG PO TABS
1.0000 | ORAL_TABLET | ORAL | Status: DC | PRN
  Administered 2012-01-08 – 2012-01-09 (×2): 1 via ORAL
  Filled 2012-01-08 (×2): qty 1

## 2012-01-08 MED ORDER — FENTANYL CITRATE 0.05 MG/ML IJ SOLN
INTRAMUSCULAR | Status: DC | PRN
  Administered 2012-01-08: 50 ug via INTRAVENOUS
  Administered 2012-01-08: 25 ug via INTRAVENOUS
  Administered 2012-01-08: 50 ug via INTRAVENOUS
  Administered 2012-01-08 (×2): 25 ug via INTRAVENOUS
  Administered 2012-01-08 (×2): 50 ug via INTRAVENOUS
  Administered 2012-01-08: 100 ug via INTRAVENOUS
  Administered 2012-01-08: 25 ug via INTRAVENOUS

## 2012-01-08 MED ORDER — AMLODIPINE BESYLATE 5 MG PO TABS
5.0000 mg | ORAL_TABLET | Freq: Every day | ORAL | Status: DC
  Administered 2012-01-09: 5 mg via ORAL
  Filled 2012-01-08 (×2): qty 1

## 2012-01-08 MED ORDER — ONDANSETRON HCL 4 MG/2ML IJ SOLN
INTRAMUSCULAR | Status: DC | PRN
  Administered 2012-01-08: 4 mg via INTRAVENOUS

## 2012-01-08 MED ORDER — TECHNETIUM TC 99M SULFUR COLLOID FILTERED
1.0000 | Freq: Once | INTRAVENOUS | Status: AC | PRN
  Administered 2012-01-08: 1 via INTRADERMAL

## 2012-01-08 MED ORDER — MEPERIDINE HCL 25 MG/ML IJ SOLN: 6.2500 mg | INTRAMUSCULAR | Status: DC | PRN

## 2012-01-08 MED ORDER — LEVOTHYROXINE SODIUM 75 MCG PO TABS
75.0000 ug | ORAL_TABLET | Freq: Every day | ORAL | Status: DC
  Administered 2012-01-09: 75 ug via ORAL
  Filled 2012-01-08 (×2): qty 1

## 2012-01-08 MED ORDER — ENOXAPARIN SODIUM 40 MG/0.4ML ~~LOC~~ SOLN
40.0000 mg | SUBCUTANEOUS | Status: DC
  Administered 2012-01-09: 40 mg via SUBCUTANEOUS
  Filled 2012-01-08: qty 0.4

## 2012-01-08 MED ORDER — METHYLENE BLUE 1 % INJ SOLN
INTRAMUSCULAR | Status: AC
  Filled 2012-01-08: qty 10

## 2012-01-08 MED ORDER — GLYCOPYRROLATE 0.2 MG/ML IJ SOLN
INTRAMUSCULAR | Status: DC | PRN
  Administered 2012-01-08: .4 mg via INTRAVENOUS

## 2012-01-08 MED ORDER — DROPERIDOL 2.5 MG/ML IJ SOLN
INTRAMUSCULAR | Status: DC | PRN
  Administered 2012-01-08: 0.625 mg via INTRAVENOUS

## 2012-01-08 MED ORDER — LACTATED RINGERS IV SOLN
INTRAVENOUS | Status: DC
  Administered 2012-01-08: 10:00:00 via INTRAVENOUS

## 2012-01-08 MED ORDER — BUPIVACAINE HCL (PF) 0.25 % IJ SOLN
INTRAMUSCULAR | Status: AC
  Filled 2012-01-08: qty 30

## 2012-01-08 MED ORDER — MIDAZOLAM HCL 2 MG/2ML IJ SOLN
INTRAMUSCULAR | Status: AC
  Filled 2012-01-08: qty 2

## 2012-01-08 MED ORDER — LIDOCAINE HCL (CARDIAC) 20 MG/ML IV SOLN
INTRAVENOUS | Status: DC | PRN
  Administered 2012-01-08: 100 mg via INTRAVENOUS

## 2012-01-08 MED ORDER — OXYCODONE HCL 5 MG/5ML PO SOLN: 5.0000 mg | Freq: Once | ORAL | Status: DC | PRN

## 2012-01-08 MED ORDER — HYDROMORPHONE HCL PF 1 MG/ML IJ SOLN
INTRAMUSCULAR | Status: AC
  Filled 2012-01-08: qty 1

## 2012-01-08 MED ORDER — LACTATED RINGERS IV SOLN
INTRAVENOUS | Status: DC | PRN
  Administered 2012-01-08 (×2): via INTRAVENOUS

## 2012-01-08 MED ORDER — HYDROMORPHONE HCL PF 1 MG/ML IJ SOLN
0.2500 mg | INTRAMUSCULAR | Status: DC | PRN
  Administered 2012-01-08: 0.25 mg via INTRAVENOUS

## 2012-01-08 MED ORDER — ROCURONIUM BROMIDE 100 MG/10ML IV SOLN
INTRAVENOUS | Status: DC | PRN
  Administered 2012-01-08: 50 mg via INTRAVENOUS

## 2012-01-08 MED ORDER — BUPIVACAINE HCL 0.25 % IJ SOLN
INTRAMUSCULAR | Status: DC | PRN
  Administered 2012-01-08: 30 mL

## 2012-01-08 MED ORDER — PANTOPRAZOLE SODIUM 40 MG PO TBEC: 40.0000 mg | DELAYED_RELEASE_TABLET | Freq: Every day | ORAL | Status: DC | PRN

## 2012-01-08 MED ORDER — KCL IN DEXTROSE-NACL 20-5-0.45 MEQ/L-%-% IV SOLN
INTRAVENOUS | Status: DC
  Administered 2012-01-08 – 2012-01-09 (×2): via INTRAVENOUS
  Filled 2012-01-08 (×3): qty 1000

## 2012-01-08 MED ORDER — CLINDAMYCIN PHOSPHATE 600 MG/50ML IV SOLN
INTRAVENOUS | Status: AC
  Filled 2012-01-08: qty 50

## 2012-01-08 MED ORDER — MIDAZOLAM HCL 2 MG/2ML IJ SOLN
2.0000 mg | Freq: Once | INTRAMUSCULAR | Status: AC
  Administered 2012-01-08: 2 mg via INTRAVENOUS

## 2012-01-08 MED ORDER — OMEPRAZOLE MAGNESIUM 20 MG PO TBEC: 20.0000 mg | DELAYED_RELEASE_TABLET | Freq: Every day | ORAL | Status: DC | PRN

## 2012-01-08 MED ORDER — SODIUM CHLORIDE 0.9 % IJ SOLN
INTRAMUSCULAR | Status: DC | PRN
  Administered 2012-01-08: 10:00:00 via INTRAMUSCULAR

## 2012-01-08 MED ORDER — ONDANSETRON HCL 4 MG/2ML IJ SOLN: 4.0000 mg | Freq: Four times a day (QID) | INTRAMUSCULAR | Status: DC | PRN

## 2012-01-08 MED ORDER — HEPARIN SODIUM (PORCINE) 5000 UNIT/ML IJ SOLN: 5000.0000 [IU] | Freq: Once | INTRAMUSCULAR | Status: DC

## 2012-01-08 MED ORDER — FENTANYL CITRATE 0.05 MG/ML IJ SOLN
INTRAMUSCULAR | Status: AC
  Filled 2012-01-08: qty 2

## 2012-01-08 MED ORDER — ONDANSETRON HCL 4 MG/2ML IJ SOLN: 4.0000 mg | Freq: Once | INTRAMUSCULAR | Status: DC | PRN

## 2012-01-08 MED ORDER — LIDOCAINE-EPINEPHRINE (PF) 1 %-1:200000 IJ SOLN
INTRAMUSCULAR | Status: DC | PRN
  Administered 2012-01-08: 30 mL

## 2012-01-08 MED ORDER — FENTANYL CITRATE 0.05 MG/ML IJ SOLN
50.0000 ug | Freq: Once | INTRAMUSCULAR | Status: AC
  Administered 2012-01-08: 50 ug via INTRAVENOUS

## 2012-01-08 MED ORDER — NEOSTIGMINE METHYLSULFATE 1 MG/ML IJ SOLN
INTRAMUSCULAR | Status: DC | PRN
  Administered 2012-01-08: 3 mg via INTRAVENOUS

## 2012-01-08 MED ORDER — DEXAMETHASONE SODIUM PHOSPHATE 4 MG/ML IJ SOLN
INTRAMUSCULAR | Status: DC | PRN
  Administered 2012-01-08: 4 mg via INTRAVENOUS

## 2012-01-08 MED ORDER — SODIUM CHLORIDE 0.9 % IR SOLN
Status: DC | PRN
  Administered 2012-01-08 (×2): 1

## 2012-01-08 SURGICAL SUPPLY — 64 items
ADH SKN CLS APL DERMABOND .7 (GAUZE/BANDAGES/DRESSINGS)
APL SKNCLS STERI-STRIP NONHPOA (GAUZE/BANDAGES/DRESSINGS) ×1
APPLIER CLIP 9.375 SM OPEN (CLIP) ×4
APR CLP SM 9.3 20 MLT OPN (CLIP) ×2
BENZOIN TINCTURE PRP APPL 2/3 (GAUZE/BANDAGES/DRESSINGS) ×1 IMPLANT
BINDER BREAST LRG (GAUZE/BANDAGES/DRESSINGS) IMPLANT
BINDER BREAST XLRG (GAUZE/BANDAGES/DRESSINGS) ×1 IMPLANT
BNDG COHESIVE 4X5 TAN STRL (GAUZE/BANDAGES/DRESSINGS) ×2 IMPLANT
CANISTER SUCTION 2500CC (MISCELLANEOUS) ×2 IMPLANT
CHLORAPREP W/TINT 26ML (MISCELLANEOUS) ×2 IMPLANT
CLIP APPLIE 9.375 SM OPEN (CLIP) ×1 IMPLANT
CLOTH BEACON ORANGE TIMEOUT ST (SAFETY) ×2 IMPLANT
CLSR STERI-STRIP ANTIMIC 1/2X4 (GAUZE/BANDAGES/DRESSINGS) ×1 IMPLANT
CONT SPEC 4OZ CLIKSEAL STRL BL (MISCELLANEOUS) ×4 IMPLANT
COVER MAYO STAND STRL (DRAPES) ×1 IMPLANT
COVER PROBE W GEL 5X96 (DRAPES) ×2 IMPLANT
COVER SURGICAL LIGHT HANDLE (MISCELLANEOUS) ×2 IMPLANT
DERMABOND ADVANCED (GAUZE/BANDAGES/DRESSINGS)
DERMABOND ADVANCED .7 DNX12 (GAUZE/BANDAGES/DRESSINGS) ×1 IMPLANT
DRAIN CHANNEL 19F RND (DRAIN) ×2 IMPLANT
DRAPE LAPAROSCOPIC ABDOMINAL (DRAPES) ×1 IMPLANT
DRAPE PROXIMA HALF (DRAPES) ×1 IMPLANT
DRSG PAD ABDOMINAL 8X10 ST (GAUZE/BANDAGES/DRESSINGS) ×1 IMPLANT
ELECT BLADE 4.0 EZ CLEAN MEGAD (MISCELLANEOUS)
ELECT COATED BLADE 2.86 ST (ELECTRODE) ×2 IMPLANT
ELECT REM PT RETURN 9FT ADLT (ELECTROSURGICAL) ×2
ELECTRODE BLDE 4.0 EZ CLN MEGD (MISCELLANEOUS) ×1 IMPLANT
ELECTRODE REM PT RTRN 9FT ADLT (ELECTROSURGICAL) ×1 IMPLANT
EVACUATOR SILICONE 100CC (DRAIN) ×2 IMPLANT
GLOVE BIO SURGEON STRL SZ7.5 (GLOVE) ×1 IMPLANT
GLOVE BIOGEL PI IND STRL 6.5 (GLOVE) IMPLANT
GLOVE BIOGEL PI IND STRL 7.5 (GLOVE) IMPLANT
GLOVE BIOGEL PI INDICATOR 6.5 (GLOVE) ×2
GLOVE BIOGEL PI INDICATOR 7.5 (GLOVE) ×1
GLOVE ECLIPSE 6.5 STRL STRAW (GLOVE) ×2 IMPLANT
GLOVE SS BIOGEL STRL SZ 6.5 (GLOVE) IMPLANT
GLOVE SUPERSENSE BIOGEL SZ 6.5 (GLOVE) ×1
GLOVE SURG SS PI 7.5 STRL IVOR (GLOVE) ×4 IMPLANT
GOWN STRL NON-REIN LRG LVL3 (GOWN DISPOSABLE) ×5 IMPLANT
GOWN STRL REIN XL XLG (GOWN DISPOSABLE) ×2 IMPLANT
KIT BASIN OR (CUSTOM PROCEDURE TRAY) ×2 IMPLANT
KIT ROOM TURNOVER OR (KITS) ×2 IMPLANT
NDL 18GX1X1/2 (RX/OR ONLY) (NEEDLE) ×1 IMPLANT
NDL HYPO 25GX1X1/2 BEV (NEEDLE) ×1 IMPLANT
NEEDLE 18GX1X1/2 (RX/OR ONLY) (NEEDLE) ×2 IMPLANT
NEEDLE HYPO 25GX1X1/2 BEV (NEEDLE) ×4 IMPLANT
NS IRRIG 1000ML POUR BTL (IV SOLUTION) ×3 IMPLANT
PACK GENERAL/GYN (CUSTOM PROCEDURE TRAY) ×2 IMPLANT
PAD ARMBOARD 7.5X6 YLW CONV (MISCELLANEOUS) ×2 IMPLANT
PEN SKIN MARKING BROAD (MISCELLANEOUS) ×1 IMPLANT
SPECIMEN JAR X LARGE (MISCELLANEOUS) ×2 IMPLANT
SPONGE GAUZE 4X4 12PLY (GAUZE/BANDAGES/DRESSINGS) ×1 IMPLANT
STAPLER VISISTAT 35W (STAPLE) ×2 IMPLANT
STOCKINETTE IMPERVIOUS 9X36 MD (GAUZE/BANDAGES/DRESSINGS) ×2 IMPLANT
STRIP CLOSURE SKIN 1/2X4 (GAUZE/BANDAGES/DRESSINGS) ×1 IMPLANT
SUT ETHILON 2 0 FS 18 (SUTURE) ×1 IMPLANT
SUT ETHILON 3 0 FSL (SUTURE) ×1 IMPLANT
SUT MNCRL AB 4-0 PS2 18 (SUTURE) ×4 IMPLANT
SUT SILK 2 0 FS (SUTURE) ×2 IMPLANT
SUT VIC AB 3-0 SH 8-18 (SUTURE) ×3 IMPLANT
SYR CONTROL 10ML LL (SYRINGE) ×3 IMPLANT
TOWEL OR 17X24 6PK STRL BLUE (TOWEL DISPOSABLE) ×1 IMPLANT
TOWEL OR 17X26 10 PK STRL BLUE (TOWEL DISPOSABLE) ×2 IMPLANT
TOWEL OR NON WOVEN STRL DISP B (DISPOSABLE) ×1 IMPLANT

## 2012-01-08 NOTE — Plan of Care (Signed)
Problem: Consults Goal: Diagnosis-Modified Radical Mastectomy Right Mastectomy     

## 2012-01-08 NOTE — Transfer of Care (Signed)
Immediate Anesthesia Transfer of Care Note  Patient: Madison Huerta  Procedure(s) Performed: Procedure(s) (LRB) with comments: MASTECTOMY WITH SENTINEL LYMPH NODE BIOPSY (Right) - right breast mastectomy with sentinel lymph node biopsy   Patient Location: PACU  Anesthesia Type:General  Level of Consciousness: awake, alert  and oriented  Airway & Oxygen Therapy: Patient Spontanous Breathing and Patient connected to nasal cannula oxygen  Post-op Assessment: Report given to PACU RN, Post -op Vital signs reviewed and stable and Patient moving all extremities X 4  Post vital signs: Reviewed and stable  Complications: No apparent anesthesia complications

## 2012-01-08 NOTE — Anesthesia Preprocedure Evaluation (Signed)
Anesthesia Evaluation  Patient identified by MRN, date of birth, ID band Patient awake    Reviewed: Allergy & Precautions, H&P , NPO status , Patient's Chart, lab work & pertinent test results  Airway Mallampati: I TM Distance: >3 FB Neck ROM: Full    Dental   Pulmonary          Cardiovascular hypertension, Pt. on medications  Pt has a small VSD. Had echo at [facility] Asymptomatic.   Neuro/Psych    GI/Hepatic GERD-  Medicated and Controlled,  Endo/Other    Renal/GU      Musculoskeletal   Abdominal   Peds  Hematology   Anesthesia Other Findings   Reproductive/Obstetrics                           Anesthesia Physical Anesthesia Plan  ASA: II  Anesthesia Plan: General   Post-op Pain Management:    Induction: Intravenous  Airway Management Planned: LMA  Additional Equipment:   Intra-op Plan:   Post-operative Plan: Extubation in OR  Informed Consent: I have reviewed the patients History and Physical, chart, labs and discussed the procedure including the risks, benefits and alternatives for the proposed anesthesia with the patient or authorized representative who has indicated his/her understanding and acceptance.     Plan Discussed with: CRNA and Surgeon  Anesthesia Plan Comments:         Anesthesia Quick Evaluation

## 2012-01-08 NOTE — Brief Op Note (Signed)
01/08/2012  1:53 PM  PATIENT:  Madison Huerta  71 y.o. female  PRE-OPERATIVE DIAGNOSIS:  Right Breast Cancer  POST-OPERATIVE DIAGNOSIS:  Right Breast Cancer  PROCEDURE:  Procedure(s) (LRB) with comments: MASTECTOMY WITH SENTINEL LYMPH NODE BIOPSY (Right) - right breast mastectomy with sentinel lymph node biopsy   SURGEON:  Surgeon(s) and Role:    * Lodema Pilot, DO - Primary  PHYSICIAN ASSISTANT:   ASSISTANTS: none   ANESTHESIA:   general  EBL:  Total I/O In: 1200 [I.V.:1200] Out: 200 [Blood:200]  BLOOD ADMINISTERED:none  DRAINS: (31F) Jackson-Pratt drain(s) with closed bulb suction in the mastectomy cavity   LOCAL MEDICATIONS USED:  MARCAINE    and LIDOCAINE   SPECIMEN:  Source of Specimen:  SLN 1, SLN 2, right mastectomy suture lateral, additional pectoralis muscle  DISPOSITION OF SPECIMEN:  PATHOLOGY  COUNTS:  YES  TOURNIQUET:  * No tourniquets in log *  DICTATION: .Other Dictation: Dictation Number   PLAN OF CARE: Admit for overnight observation  PATIENT DISPOSITION:  PACU - hemodynamically stable.   Delay start of Pharmacological VTE agent (>24hrs) due to surgical blood loss or risk of bleeding: no

## 2012-01-08 NOTE — Anesthesia Postprocedure Evaluation (Signed)
Anesthesia Post Note  Patient: Madison Huerta  Procedure(s) Performed: Procedure(s) (LRB): MASTECTOMY WITH SENTINEL LYMPH NODE BIOPSY (Right)  Anesthesia type: general  Patient location: PACU  Post pain: Pain level controlled  Post assessment: Patient's Cardiovascular Status Stable  Last Vitals:  Filed Vitals:   01/08/12 1454  BP: 178/57  Pulse: 76  Temp: 36.5 C  Resp: 20    Post vital signs: Reviewed and stable  Level of consciousness: sedated  Complications: No apparent anesthesia complications

## 2012-01-08 NOTE — Interval H&P Note (Signed)
History and Physical Interval Note:  01/08/2012 10:03 AM  Kennis Carina  has presented today for surgery, with the diagnosis of right breast cancer  The various methods of treatment have been discussed with the patient and family. After consideration of risks, benefits and other options for treatment, the patient has consented to  Procedure(s) (LRB) with comments: MASTECTOMY WITH SENTINEL LYMPH NODE BIOPSY (Right) - right breast mastectomy with sentinel lymph node biopsy possible axillary dissections  as a surgical intervention .  The patient's history has been reviewed, patient examined, no change in status, stable for surgery.  I have reviewed the patient's chart and labs.  Questions were answered to the patient's satisfaction.  She was seen in the preop area and site marked.  She desires to proceed with right mastectomy with sentinel lymph node biopsy.  The risks of infection, bleeding, pain, scarring, poor cosmesis, recurrence, need for re-excision or future surgery, nerve injury and lymphedema again discussed with the patient.    Lodema Pilot DAVID

## 2012-01-08 NOTE — H&P (View-Only) (Signed)
Patient ID: Madison Huerta, female   DOB: 08/02/1940, 71 y.o.   MRN: [id number]  No chief complaint on file.   HPI Madison Huerta is a 71 y.o. female.  This patient is seen in our multidisciplinary breast clinic for evaluation of a newly diagnosed right breast cancer found on annual screening mammograms. She does have a history of left breast cancer discovered in 1994 and was treated with a lumpectomy and axillary dissection followed by radiation therapy in 5 years of tamoxifen but no chemotherapy was administered. She says that her lymph nodes were all negative and she is doing well from her procedure. She does do her self breast exams and has not noticed any changes and did not notice any masses in her right breast. She denies any headaches, bony pains, or abnormal weight loss. She has no other family history of breast cancer. She had a suspicious lesion on her right mammogram and followup MRI demonstrated 2 separate areas of suspicion. 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 The entire anterior to posterior length of the suspicious area was 5.7 cm in diameter. HPI  Past Medical History  Diagnosis Date  . Hyperlipidemia   . Hypertension   . Thyroid disease     Hypothyroidism  . Osteopenia   . History of breast cancer   . Overweight   . VSD (ventricular septal defect)     does need SBE prophylaxis (Keflex  3 gm 1 hour pprior and 1.5 gm 6 hours post    Past Surgical History  Procedure Date  . Breast lumpectomy 1994    Radiation  . Parathyroidectomy 12/2002  . 2d echo 2004    Normal at [facility]    Family History  Problem Relation Age of Onset  . Aneurysm Mother     brain  . Cancer Father     Lung, Liver mets  . Heart disease Other   . Cancer Cousin     Breast CA    Social History History  Substance Use Topics  . Smoking status: Never Smoker   . Smokeless tobacco: Never Used  . Alcohol Use: Yes     1  glass of wine daily    Allergies  Allergen Reactions  . Codeine Other (See Comments)    severe headache  . Penicillins Hives    Current Outpatient Prescriptions  Medication Sig Dispense Refill  . amLODipine (NORVASC) 5 MG tablet Take 1 tablet (5 mg total) by mouth daily.  30 tablet  11  . calcium carbonate (TUMS) 500 MG chewable tablet Chew 1 tablet by mouth as needed.        . Cholecalciferol (VITAMIN D) 1000 UNITS capsule Take 1,000 Units by mouth daily.        . levothyroxine (SYNTHROID, LEVOTHROID) 75 MCG tablet Take 1 tablet (75 mcg total) by mouth daily.  30 tablet  6  . omeprazole (PRILOSEC OTC) 20 MG tablet Take 1 tablet (20 mg total) by mouth 2 (two) times daily.  60 tablet  11  . rosuvastatin (CRESTOR) 10 MG tablet Take 1 tablet (10 mg total) by mouth daily.  30 tablet  11  . triamcinolone cream (KENALOG) 0.1 % Apply topically 2 (two) times daily.  454 g  1    Review of Systems Review of Systems All other review of systems negative or noncontributory except as stated in the HPI  There were no vitals taken for   this visit.  Physical Exam Physical Exam Wt Readings from Last 3 Encounters:  12/16/11 137 lb 8 oz (62.37 kg)  09/25/11 141 lb 8 oz (64.184 kg)  02/18/11 137 lb (62.143 kg)   Temp Readings from Last 3 Encounters:  12/16/11 98 F (36.7 C)   09/25/11 98 F (36.7 C) Oral  02/18/11 98.1 F (36.7 C) Oral   BP Readings from Last 3 Encounters:  12/16/11 163/71  09/25/11 152/70  02/18/11 158/72   Pulse Readings from Last 3 Encounters:  12/16/11 75  09/25/11 80  02/18/11 84  Physical Exam  Nursing note and vitals reviewed. Constitutional: She is oriented to person, place, and time. She appears well-developed and well-nourished. No distress.  HENT:  Head: Normocephalic and atraumatic.  Mouth/Throat: No oropharyngeal exudate.  Eyes: Conjunctivae and EOM are normal. Pupils are equal, round, and reactive to light. Right eye exhibits no discharge. Left eye  exhibits no discharge. No scleral icterus.  Neck: Normal range of motion. Neck supple. No tracheal deviation present.  Cardiovascular: Normal rate, regular rhythm, normal heart sounds and intact distal pulses.   Pulmonary/Chest: Effort normal and breath sounds normal. No stridor. No respiratory distress. She has no wheezes.  Abdominal: Soft. Bowel sounds are normal. She exhibits no distension and no mass. There is no tenderness. There is no rebound and no guarding.  Musculoskeletal: Normal range of motion. She exhibits no edema and no tenderness.  Neurological: She is alert and oriented to person, place, and time.  Skin: Skin is warm and dry. No rash noted. She is not diaphoretic. No erythema. No pallor.  Psychiatric: She has a normal mood and affect. Her behavior is normal. Judgment and thought content normal.  Breasts:  Her right breast as biopsy site changes at the 1:00 position as well as some bruising across the upper and outer aspect of the breast she does have a palpable 1.5 cm nodule at the 10:00 position just under some bruising about 1 inch from the nipples which is likely biopsy changes since the patient says she did not feel this previously. Otherwise there are no dominant masses or suspicious lymphadenopathy Her left breast has a transverse surgical scar with out the nipple areolar complex. She has a small palpable scar the middle portion of her prior biopsy scar but no other evidence of recurrence. She does have some dimpling of this breast. 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 She is not that large breast as well and her pathology was consistent with invasive breast cancer as well as  DCIS. We discussed the options of mastectomy with sentinel lymph node biopsy versus neoadjuvant hormones her chemotherapy followed by breast conservation therapy and sentinel lymph node biopsy. We talked about the pros and cons and the risks and benefits of each. Think that she is leaning towards mastectomy with sentinel lymph node she but she would like to speak with the oncologist as well to discuss chemotherapy options prior to making a final decision. Given the size being greater than 5 cm, she will likely need radiation regardless of the option that she chooses. She is likely going to receive genetics as well. We discussed the risks of the procedure. The risks   of infection, bleeding, pain, scarring, poor cosmesis, recurrence, need for re-excision or future surgery, nerve injury and lymphedema were discussed with the patient.     Plan    She is currently undecided if she were to proceed with mastectomy versus neoadjuvant therapy followed by breast conservation therapy. She will think about her options and let us know what she would like to proceed with.       Cortney Beissel DAVID 12/16/2011, 9:23 AM    

## 2012-01-09 MED ORDER — HYDROCODONE-ACETAMINOPHEN 5-325 MG PO TABS: 1.0000 | ORAL_TABLET | ORAL | Status: DC | PRN

## 2012-01-09 NOTE — Progress Notes (Signed)
1 Day Post-Op  Subjective: Alert and comfortable. Vital signs stable. Ambulating. Tolerating diet. Begging to go home  Objective: Vital signs in last 24 hours: Temp:  [97.1 F (36.2 C)-98.1 F (36.7 C)] 98.1 F (36.7 C) (11/16 0552) Pulse Rate:  [69-88] 69  (11/16 0552) Resp:  [13-38] 16  (11/16 0552) BP: (123-178)/(42-63) 123/42 mmHg (11/16 0552) SpO2:  [97 %-100 %] 98 % (11/16 0552) FiO2 (%):  [28 %] 28 % (11/15 1527) Weight:  [142 lb 1.6 oz (64.456 kg)] 142 lb 1.6 oz (64.456 kg) (11/15 1454)    Intake/Output from previous day: 11/15 0701 - 11/16 0700 In: 1440 [P.O.:240; I.V.:1200] Out: 540 [Urine:300; Drains:40; Blood:200] Intake/Output this shift:    General appearance: alert. Cooperative. Mental status normal. No distress. Breasts:  right mastectomy skin flaps healthy pink well vascularized. No hematoma. Both drains functioning well.  Lab Results:  No results found for this or any previous visit (from the past 24 hour(s)).   Studies/Results: @RISRSLT24 @     . amLODipine  5 mg Oral QPC breakfast  . [COMPLETED] clindamycin (CLEOCIN) IV  900 mg Intravenous Once  . enoxaparin  40 mg Subcutaneous Q24H  . [COMPLETED] fentaNYL  50 mcg Intravenous Once  . levothyroxine  75 mcg Oral QAC breakfast  . [COMPLETED] midazolam  2 mg Intravenous Once     Assessment/Plan: s/p Procedure(s): MASTECTOMY WITH SENTINEL LYMPH NODE BIOPSY  Doing well. Discharge home today. Drain care instructions completed Prescription for Vicodin given Appointment with Dr. Biagio Quint in 6 days already set up. Diet and activities discussed.  Patient Active Hospital Problem List: No active hospital problems.   LOS: 1 day    Gillermo Poch M 01/09/2012  . .prob

## 2012-01-09 NOTE — Discharge Summary (Signed)
  Patient ID: Madison Huerta [id number] 71 y.o. 12-02-1940  01/08/2012  Discharge date and time: January 09, 2012  Admitting Physician: Lodema Pilot, MD  Discharge Physician: Ernestene Mention  Admission Diagnoses: right breast cancer  Discharge Diagnoses: Same  Operations: Procedure(s): MASTECTOMY WITH SENTINEL LYMPH NODE BIOPSY  Admission Condition: good  Discharged Condition: good  Indication for Admission: This patient has a history of a left breast cancer in 1994 treated with lumpectomy and axillary dissection and radiation therapy. Recent mammogram showed a suspicious lesion of her right side and followup MRI demonstrated 2 separate areas of suspicion. Biopsies were performed and one was invasive ductal carcinoma and the other was DCIS. The entire anterior to posteriorly to the suspicious area was 5.7 cm in diameter. She underwent multidisciplinary consultation, and eventually elected to have a mastectomy  Hospital Course: On the day of admission the patient underwent right total mastectomy and sentinel node biopsy. On the morning of postop day 1 the patient felt well and was asking to go home. She is having minimal pain. She was ambulatory. Tolerating diet. Her mastectomy skin flaps looked healthy and the drainage was thin. She was given instruction in wound and drain care. She was given a prescription for Vicodin. She has an appointment to see Dr. Biagio Quint in the office in 6 days. Pathology is pending at the time of discharge.  Consults: None  Significant Diagnostic Studies: none  Treatments: surgery: Right total mastectomy with sentinel node biopsy  Disposition: Home  Patient Instructions:   [name], [name]  Home Medication Instructions WUJ:[id number]   Printed on:01/09/12 0957  Medication Information                    Cholecalciferol (VITAMIN D) 2000 UNITS tablet Take 2,000 Units by mouth daily.           rosuvastatin (CRESTOR) 10 MG tablet Take 10 mg by mouth  at bedtime.           omeprazole (PRILOSEC OTC) 20 MG tablet Take 20 mg by mouth daily as needed. For heartburn, reflux           amLODipine (NORVASC) 5 MG tablet Take 5 mg by mouth daily after breakfast.           levothyroxine (SYNTHROID, LEVOTHROID) 75 MCG tablet take 1 tablet by mouth once daily           HYDROcodone-acetaminophen (NORCO/VICODIN) 5-325 MG per tablet Take 1-2 tablets by mouth every 4 (four) hours as needed for pain.             Activity: activity as tolerated Diet: regular diet Wound Care: keep wound clean and dry  Follow-up:  With Dr Biagio Quint in 1 week.  Signed: Angelia Mould. Derrell Lolling, M.D., FACS General and minimally invasive surgery Breast and Colorectal Surgery  01/09/2012, 9:57 AM

## 2012-01-09 NOTE — Op Note (Signed)
Madison Huerta, Madison Huerta NO.:  [id number]  MEDICAL RECORD NO.:  [id number]  LOCATION:  6N12C                        FACILITY:  [facility]  PHYSICIAN:  Lodema Pilot, MD       DATE OF BIRTH:  02-16-1941  DATE OF PROCEDURE:  01/08/2012 DATE OF DISCHARGE:                              OPERATIVE REPORT   PROCEDURE:  Right mastectomy with sentinel lymph node biopsy.  PREOPERATIVE DIAGNOSIS:  Right breast cancer.  POSTOPERATIVE DIAGNOSIS:  Right breast cancer.  SURGEON:  Lodema Pilot, MD  ASSISTANT:  None.  ANESTHESIA:  General with 45 mL of 1% lidocaine with epinephrine and 0.25% Marcaine in a 50:50 mixture.  FLUIDS:  1250 mL of crystalloid.  ESTIMATED BLOOD LOSS:  200 mL.  DRAINS:  A 19-French Blake drain placed in the mastectomy cavity.  SPECIMENS: 1. Sentinel lymph node #1 and blue with a gamma count of 1033. 2. Sentinel lymph node #2, which was not hot and not blue, palpable. 3. Right mastectomy with a suture marking in the lateral skin edge. 4. Additional pectoralis muscle.  COMPLICATIONS:  None apparent.  FINDINGS:  Possible involvement of the pectoralis muscles.  So, I took additional pectoral muscle, otherwise uneventful right mastectomy and sentinel lymph node biopsy.  INDICATION FOR PROCEDURE:  Ms. Riso is a 71 year old female who has had a history of a left breast cancer after treated many years ago, and now was found to have a right breast cancer.  She was seen at our Multidisciplinary Breast Clinic.  She has two areas of concern on MRI and she elected for mastectomy with sentinel node biopsy.  OPERATIVE DETAILS:  Ms. Rencher was seen and evaluated in the preoperative area and risks and benefits of the procedure were discussed in lay terms.  Informed consent was obtained.  Surgical site was marked prior to anesthetic administration, injected preoperatively with technetium sulfur colloid.  She was taken to the operating room and she was given  subcutaneous heparin and prophylactic antibiotics and taken to the operating room, placed on table in supine position, and general anesthesia was obtained.  Then, procedure time-out was performed with all operative team members to confirm proper patient, procedure, and her right breast was injected with 5 mL of methylene blue and 5 minutes of vigorous breast massage was performed.  Chest, arm, and axilla were prepped and draped in a standard surgical fashion.  Prepping her arm into the field and using the Neoprobe, I just found the area of greatest activity in the axilla.  A small incision was made just inferior to the hair-bearing area of the axilla and the area of greatest activity and dissection carried down into the clavipectoral fascia using Bovie electrocautery.  The axillary cavity was entered and dissection was guided by the Neoprobe.  I did see some blue lymphatics and the blue lymphatic coursing to a blue lymph node and this was elevated and dissected circumferentially using Bovie electrocautery and multiple hemoclips on any lymphovascular structures.  There actually appeared to be only two nodes in the same area and these were sent together as sentinel lymph node #1, which was hot and blue, and a gamma count of 1033.  The probe was placed back into the axilla and there was not any significant radioactivity identified.  She did have a palpable lymph node in the same area as her sentinel lymph nodes and I elevated this and dissected it circumferentially in similar fashion using multiple hemoclips and Bovie electrocautery.  This was not hot and not blue, but palpable and sent to Pathology for permanent sectioning as sentinel lymph node #2.  The probe was placed back in the axilla and I did not identify any other radioactivity.  There was no other blue lymph nodes identified and no other palpable lymph nodes.  The basin count was 0. The cavity was inspected for hemostasis, which was  obtained with Bovie electrocautery.  The wound was irrigated with sterile saline solution. The wound was noted to be hemostatic and the clavipectoral fascia was approximated with few figure-of-eight 3-0 Vicryl sutures and the skin edges were approximated with 4-0 Monocryl subcuticular suture.  Then, an elliptical incision was made in the breast to encompass the nipple- areolar complex and dissection carried down into the breast tissue using Bovie electrocautery.  Mastectomy flaps were elevated circumferentially measuring about 5-7 mm thick, taking care not to make the flaps to staying for too thick.  The flaps were carried medially to the sternum cephalad to the clavicle, laterally to the latissimus dorsi muscle, and inferiorly to the rectus abdominal muscles.  After the flaps were raised circumferentially, I elevated the breast from medial to lateral along the pectoralis muscle taking the fascia with the mastectomy specimen. However, right in the central portion of the breast, it appeared that the posterior aspect of the fascia that she could have some tumor involvement in this area with fibrotic area and possibly some muscle involvement.  So after I elevated the breast, I took additional piece of the muscles in this area that was questionably involved and this was sent down as specimen for additional pectoralis muscle.  I placed 4 hemoclips in the muscle in this area, 12 o'clock, 3 o'clock, 6 o'clock, and 9 o'clock positions as well as silk suture.  Continue to elevate the breast laterally and amputated this laterally.  Then, a suture was placed at the lateral skin edge and this was sent to Pathology for right mastectomy.  The wound was inspected for hemostasis, which was obtained with Bovie electrocautery and the wound was irrigated with several liters of sterile saline solution until the irrigation returned clear. A 19-French Blake drain was placed through a separate stab incision  and placed into the mastectomy flaps.  The wound was again noted to be hemostatic and the dermis was approximated with several 3-0 Vicryl sutures and the skin edges were approximated with 4-0 Monocryl subcuticular suture.  Then, the skin was washed and dried, and benzoin and Steri-Strips were applied at all skin incisions and I filled the JP drain with 45 mL of 50:50 mixture of 1% lidocaine with epinephrine and 0.25% Marcaine to be charged later in the PACU.  All sponge, needle, and instrument counts were correct at the end of the case, and the patient tolerated the procedure well without apparent complications.          ______________________________ Lodema Pilot, MD     BL/MEDQ  D:  01/08/2012  T:  01/09/2012  Job:  [id number]

## 2012-01-11 ENCOUNTER — Encounter (HOSPITAL_COMMUNITY): Payer: Self-pay | Admitting: General Surgery

## 2012-01-12 ENCOUNTER — Telehealth: Payer: Self-pay

## 2012-01-12 NOTE — Telephone Encounter (Signed)
Ann nurse with [facility] request name of doctor pt f/u re: abnormal mammo at [place] and [place] [facility] did breast biopsy but needs name of surgeon. Sherilyn Cooter DO given to [facility].

## 2012-01-14 ENCOUNTER — Encounter (INDEPENDENT_AMBULATORY_CARE_PROVIDER_SITE_OTHER): Payer: Self-pay | Admitting: General Surgery

## 2012-01-14 ENCOUNTER — Ambulatory Visit (INDEPENDENT_AMBULATORY_CARE_PROVIDER_SITE_OTHER): Payer: PRIVATE HEALTH INSURANCE | Admitting: General Surgery

## 2012-01-14 VITALS — BP 144/86 | HR 60 | Temp 97.0°F | Resp 16 | Ht 66.0 in | Wt 136.0 lb

## 2012-01-14 DIAGNOSIS — Z4889 Encounter for other specified surgical aftercare: Secondary | ICD-10-CM

## 2012-01-14 DIAGNOSIS — Z5189 Encounter for other specified aftercare: Secondary | ICD-10-CM

## 2012-01-14 NOTE — Progress Notes (Signed)
Subjective:     Patient ID: Madison Huerta, female   DOB: 10-Feb-1941, 71 y.o.   MRN: [id number]  HPI 1 week s/p right mastectomy with SLN.  Path with 3 foci of IDC with the largest 1.2cm.  Margins negative.  I took some extra pectoralis muscle in area of concern and no malignancy was seen in this specimen.  Nodes were negative.  She feels well and did not take any pain medication after leaving the hospital.  Drain has about 30-17ml/day.    Review of Systems     Objective:   Physical Exam Some bruising in flaps but flaps otherwise look good.  I cannot see the skin edges at the incision due to steristrips.  No swelling or numbness.  Good cosmesis.    Assessment:     S/p right mastectomy with SLN-Doing well She looks great and path reviewed.  Drain removed today.      Plan:     She can gradually increase activity as tolerated.  I would like to see her back in 10-14 days to reevaluate her skin and flaps after the steris have come off.  She will follow up with medical and radiation oncology to discuss adjuvant options.  She remains not interested in reconstruction.

## 2012-01-15 ENCOUNTER — Encounter (INDEPENDENT_AMBULATORY_CARE_PROVIDER_SITE_OTHER): Payer: PRIVATE HEALTH INSURANCE | Admitting: General Surgery

## 2012-01-20 ENCOUNTER — Encounter: Payer: Self-pay | Admitting: *Deleted

## 2012-01-20 NOTE — Progress Notes (Signed)
Received Oncotype Dx results of 16.  Gave copy to MD.  Took copy to Med Rec to scan.  

## 2012-01-25 ENCOUNTER — Telehealth (INDEPENDENT_AMBULATORY_CARE_PROVIDER_SITE_OTHER): Payer: Self-pay

## 2012-01-25 NOTE — Telephone Encounter (Signed)
Pt is po breast lumpectomy and reports some swelling in her breast.  She has had fluid drained from the breast previously and says it is not tight or painful.  She has an appt on 01/29/12 with Dr. Biagio Quint and unless the area becomes painful I told her it would be fine to wait to see him.  She agreed.

## 2012-01-27 ENCOUNTER — Encounter (INDEPENDENT_AMBULATORY_CARE_PROVIDER_SITE_OTHER): Payer: Self-pay | Admitting: General Surgery

## 2012-01-27 ENCOUNTER — Ambulatory Visit (INDEPENDENT_AMBULATORY_CARE_PROVIDER_SITE_OTHER): Payer: PRIVATE HEALTH INSURANCE | Admitting: General Surgery

## 2012-01-27 VITALS — BP 154/72 | HR 87 | Temp 98.1°F | Ht 61.0 in | Wt 136.4 lb

## 2012-01-27 DIAGNOSIS — Z5189 Encounter for other specified aftercare: Secondary | ICD-10-CM

## 2012-01-27 DIAGNOSIS — Z4889 Encounter for other specified surgical aftercare: Secondary | ICD-10-CM

## 2012-01-27 NOTE — Progress Notes (Signed)
Subjective:     Patient ID: Madison Huerta, female   DOB: Jan 18, 1941, 71 y.o.   MRN: [id number]  HPI  This patient follows up for wound check status post right mastectomy and sentinel lymph node biopsy. Her Steri-Strips have come off and she is showering normally she has some sensitivity to her incisions both otherwise no complaints. We have arty reviewed her pathology with her and she is scheduled to followup with her oncologist in a week and a half. Review of Systems     Objective:   Physical Exam Her incisions looks nice without any sign of infection. Her skin flaps are viable. There is no evidence of nodularity or any evidence of recurrent tumor.    Assessment:     Status post right mastectomy with sentinel lymph node biopsy-doing well Her incisions are healing nicely and the hillside infection. She is going to continue with her monthly self breast exams and followup with her oncologist in a couple weeks to review her oncotype DX and discussed adjuvant therapies. She can gradually increase her activity as tolerated and I have no restrictions for her.    Plan:     Increase activity as tolerated and follow up with medical and radiation oncology. I will see her back in 3 months. Continue monthly self breast exam

## 2012-02-03 ENCOUNTER — Other Ambulatory Visit: Payer: Self-pay | Admitting: Family Medicine

## 2012-02-08 ENCOUNTER — Telehealth: Payer: Self-pay | Admitting: Family Medicine

## 2012-02-08 DIAGNOSIS — E559 Vitamin D deficiency, unspecified: Secondary | ICD-10-CM

## 2012-02-08 DIAGNOSIS — E785 Hyperlipidemia, unspecified: Secondary | ICD-10-CM

## 2012-02-08 DIAGNOSIS — I10 Essential (primary) hypertension: Secondary | ICD-10-CM

## 2012-02-08 DIAGNOSIS — E039 Hypothyroidism, unspecified: Secondary | ICD-10-CM

## 2012-02-08 DIAGNOSIS — M899 Disorder of bone, unspecified: Secondary | ICD-10-CM

## 2012-02-08 NOTE — Telephone Encounter (Signed)
Message copied by Judy Pimple on Mon Feb 08, 2012  7:23 PM ------      Message from: Baldomero Lamy      Created: Wed Feb 03, 2012  1:19 PM      Regarding: Cpx labs Tues 12/17       Please order  future cpx labs for pt's upcoming lab appt.      Thanks      Rodney Booze

## 2012-02-09 ENCOUNTER — Telehealth: Payer: Self-pay | Admitting: *Deleted

## 2012-02-09 ENCOUNTER — Other Ambulatory Visit: Payer: Self-pay | Admitting: *Deleted

## 2012-02-09 ENCOUNTER — Ambulatory Visit (HOSPITAL_BASED_OUTPATIENT_CLINIC_OR_DEPARTMENT_OTHER): Payer: Medicare Other | Admitting: Oncology

## 2012-02-09 ENCOUNTER — Other Ambulatory Visit: Payer: Medicare Other | Admitting: Lab

## 2012-02-09 ENCOUNTER — Other Ambulatory Visit (INDEPENDENT_AMBULATORY_CARE_PROVIDER_SITE_OTHER): Payer: Medicare Other

## 2012-02-09 ENCOUNTER — Telehealth: Payer: Self-pay | Admitting: Oncology

## 2012-02-09 VITALS — BP 158/77 | HR 73 | Temp 97.9°F | Resp 20 | Ht 61.0 in | Wt 134.4 lb

## 2012-02-09 DIAGNOSIS — E785 Hyperlipidemia, unspecified: Secondary | ICD-10-CM

## 2012-02-09 DIAGNOSIS — M949 Disorder of cartilage, unspecified: Secondary | ICD-10-CM

## 2012-02-09 DIAGNOSIS — M899 Disorder of bone, unspecified: Secondary | ICD-10-CM

## 2012-02-09 DIAGNOSIS — C50319 Malignant neoplasm of lower-inner quadrant of unspecified female breast: Secondary | ICD-10-CM

## 2012-02-09 DIAGNOSIS — Z853 Personal history of malignant neoplasm of breast: Secondary | ICD-10-CM

## 2012-02-09 DIAGNOSIS — Z9010 Acquired absence of unspecified breast and nipple: Secondary | ICD-10-CM

## 2012-02-09 DIAGNOSIS — E039 Hypothyroidism, unspecified: Secondary | ICD-10-CM

## 2012-02-09 DIAGNOSIS — Z170 Estrogen receptor positive status [ER+]: Secondary | ICD-10-CM

## 2012-02-09 DIAGNOSIS — I10 Essential (primary) hypertension: Secondary | ICD-10-CM

## 2012-02-09 DIAGNOSIS — C50119 Malignant neoplasm of central portion of unspecified female breast: Secondary | ICD-10-CM

## 2012-02-09 DIAGNOSIS — E559 Vitamin D deficiency, unspecified: Secondary | ICD-10-CM

## 2012-02-09 LAB — LIPID PANEL
Cholesterol: 197 mg/dL (ref 0–200)
HDL: 66.4 mg/dL (ref >39.00)
LDL Cholesterol: 102 mg/dL — ABNORMAL HIGH (ref 0–99)
Total CHOL/HDL Ratio: 3
Triglycerides: 141 mg/dL (ref 0.0–149.0)
VLDL: 28.2 mg/dL (ref 0.0–40.0)

## 2012-02-09 LAB — COMPREHENSIVE METABOLIC PANEL
ALT: 16 U/L (ref 0–35)
AST: 25 U/L (ref 0–37)
Albumin: 4.7 g/dL (ref 3.5–5.2)
Alkaline Phosphatase: 66 U/L (ref 39–117)
BUN: 17 mg/dL (ref 6–23)
CO2: 27 mEq/L (ref 19–32)
Calcium: 9.4 mg/dL (ref 8.4–10.5)
Chloride: 105 mEq/L (ref 96–112)
Creatinine, Ser: 0.9 mg/dL (ref 0.4–1.2)
GFR: 69.4 mL/min (ref >60.00)
Glucose, Bld: 104 mg/dL — ABNORMAL HIGH (ref 70–99)
Potassium: 3.6 mEq/L (ref 3.5–5.1)
Sodium: 139 mEq/L (ref 135–145)
Total Bilirubin: 0.7 mg/dL (ref 0.3–1.2)
Total Protein: 7.7 g/dL (ref 6.0–8.3)

## 2012-02-09 LAB — CBC WITH DIFFERENTIAL/PLATELET
Basophils Absolute: 0.1 10*3/uL (ref 0.0–0.1)
Eosinophils Absolute: 0.1 10*3/uL (ref 0.0–0.7)
HCT: 40.8 % (ref 36.0–46.0)
Hemoglobin: 13.5 g/dL (ref 12.0–15.0)
Lymphs Abs: 2 10*3/uL (ref 0.7–4.0)
MCHC: 33 g/dL (ref 30.0–36.0)
MCV: 91.8 fl (ref 78.0–100.0)
Monocytes Absolute: 0.7 10*3/uL (ref 0.1–1.0)
Monocytes Relative: 9.6 % (ref 3.0–12.0)
Neutro Abs: 4.3 10*3/uL (ref 1.4–7.7)
RDW: 13.6 % (ref 11.5–14.6)

## 2012-02-09 LAB — TSH: TSH: 0.57 u[IU]/mL (ref 0.35–5.50)

## 2012-02-09 MED ORDER — ANASTROZOLE 1 MG PO TABS: 1.0000 mg | ORAL_TABLET | Freq: Every day | ORAL | Status: DC

## 2012-02-09 NOTE — Progress Notes (Signed)
Hematology and Oncology Follow Up Visit  BRIONA KORPELA [id number] 10/30/1940 71 y.o. 02/09/2012 8:04 PM   DIAGNOSIS:   Encounter Diagnosis  Name Primary?  . 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 History of previous breast cancer involving left breast status post lumpectomy radiation and tamoxifen therapy in 2003.  PAST THERAPY:   Patient returns with her husband. She did undergo a mastectomy is indicated. 3 separate lesions are identified when measuring 1.5 we'll we'll 1 and 0.8 cm. These are all grade 1 ER/PR positive breast cancers. One of the blocks for the nodules was sent for Oncotype testing and came back with a score of 15 corresponding to a risk of recurrence at 10%. This was in the low risk category. Patient is recovered well from surgery. She feels well. She has no complaints.  Interim History:  As above  Medications: I have reviewed the patient's current medications.  Allergies:  Allergies  Allergen Reactions  . Codeine Other (See Comments)    severe headache  . Penicillins Rash  . Adhesive (Tape) Rash    Past Medical History, Surgical history, Social history, and Family History were reviewed and updated.  Review of Systems: Constitutional:  Negative for fever, chills, night sweats, anorexia, weight loss, pain. Cardiovascular: no chest pain or dyspnea on exertion Respiratory: no cough, shortness of breath, or wheezing Neurological: no TIA or stroke symptoms Dermatological: negative ENT: negative Skin Gastrointestinal: negative Genito-Urinary: negative Hematological and Lymphatic: negative Breast: negative Musculoskeletal: negative Remaining ROS negative.  Physical Exam:  Blood pressure 158/77, pulse 73, temperature 97.9 F (36.6 C), temperature source Oral, resp. rate 20, height 5\' 1"  (1.549 m), weight 134 lb 6.4 oz  (60.963 kg).  ECOG: 0  Patient was not examined today.   Lab Results: Lab Results  Component Value Date   WBC 7.2 02/09/2012   HGB 13.5 02/09/2012   HCT 40.8 02/09/2012   MCV 91.8 02/09/2012   PLT 300.0 02/09/2012     Chemistry      Component Value Date/Time   NA 139 02/09/2012 0824   NA 141 12/16/2011 0828   K 3.6 02/09/2012 0824   K 3.7 12/16/2011 0828   CL 105 02/09/2012 0824   CL 105 12/16/2011 0828   CO2 27 02/09/2012 0824   CO2 24 12/16/2011 0828   BUN 17 02/09/2012 0824   BUN 16.0 12/16/2011 0828   CREATININE 0.9 02/09/2012 0824   CREATININE 0.9 12/16/2011 0828      Component Value Date/Time   CALCIUM 9.4 02/09/2012 0824   CALCIUM 9.7 12/16/2011 0828   ALKPHOS 66 02/09/2012 0824   ALKPHOS 81 12/16/2011 0828   AST 25 02/09/2012 0824   AST 26 12/16/2011 0828   ALT 16 02/09/2012 0824   ALT 18 12/16/2011 0828   BILITOT 0.7 02/09/2012 0824   BILITOT 0.80 12/16/2011 0828       Radiological Studies:  No results found.   IMPRESSIONS AND PLAN: A 71 y.o. female with   History of multifocal breast cancer involving the right breast now status post mastectomy with history of contralateral breast cancer. We discussed implications of her Oncotype score. She does not comfortable going ahead with adjuvant therapy alone i.e. hormonal therapy. She does not require a radiation therapy. All 3 sentinel lymph nodes were negative for malignancy all margins were clear. I discussed side effects of anastrozole. I have several recent bone  density test be provided so that we can review this. She'll be seen in 3 months to evaluate her side effects from anastrozole. I've encouraged her to take vitamin D.  Spent more than half the time coordinating care, as well as discussion of BMI and its implications.      Hideo Googe 12/17/20138:04 PM Cell [phone]

## 2012-02-09 NOTE — Telephone Encounter (Signed)
Will call the patient with the new date and time in 2014

## 2012-02-09 NOTE — Telephone Encounter (Signed)
gv pt appt schedule for January (genetics). Pt aware she will be contacted re 68month f/u appt. PR pt needing reassignment.

## 2012-02-09 NOTE — Addendum Note (Signed)
Addended by: Baldomero Lamy on: 02/09/2012 10:25 AM   Modules accepted: Orders

## 2012-02-16 ENCOUNTER — Telehealth: Payer: Self-pay | Admitting: *Deleted

## 2012-02-16 NOTE — Telephone Encounter (Signed)
Called pt to inform her that Dr. Donnie Coffin will no longer be here 02/24/12 and answered all questions at this time.  Confirmed 05/06/12 appt w/ pt.

## 2012-02-19 ENCOUNTER — Encounter: Payer: Self-pay | Admitting: Family Medicine

## 2012-02-19 ENCOUNTER — Ambulatory Visit (INDEPENDENT_AMBULATORY_CARE_PROVIDER_SITE_OTHER): Payer: Medicare Other | Admitting: Family Medicine

## 2012-02-19 ENCOUNTER — Other Ambulatory Visit (HOSPITAL_COMMUNITY)
Admission: RE | Admit: 2012-02-19 | Discharge: 2012-02-19 | Disposition: A | Discharge status: Home / Self Care | Payer: Medicare Other | Source: Ambulatory Visit | Attending: Family Medicine | Admitting: Family Medicine

## 2012-02-19 VITALS — BP 130/65 | HR 73 | Temp 97.6°F | Ht 60.5 in | Wt 134.5 lb

## 2012-02-19 DIAGNOSIS — E559 Vitamin D deficiency, unspecified: Secondary | ICD-10-CM

## 2012-02-19 DIAGNOSIS — Z01419 Encounter for gynecological examination (general) (routine) without abnormal findings: Secondary | ICD-10-CM

## 2012-02-19 DIAGNOSIS — E039 Hypothyroidism, unspecified: Secondary | ICD-10-CM

## 2012-02-19 DIAGNOSIS — E785 Hyperlipidemia, unspecified: Secondary | ICD-10-CM

## 2012-02-19 DIAGNOSIS — Z853 Personal history of malignant neoplasm of breast: Secondary | ICD-10-CM

## 2012-02-19 DIAGNOSIS — I10 Essential (primary) hypertension: Secondary | ICD-10-CM

## 2012-02-19 DIAGNOSIS — M899 Disorder of bone, unspecified: Secondary | ICD-10-CM

## 2012-02-19 DIAGNOSIS — Z1151 Encounter for screening for human papillomavirus (HPV): Secondary | ICD-10-CM | POA: Insufficient documentation

## 2012-02-19 DIAGNOSIS — M949 Disorder of cartilage, unspecified: Secondary | ICD-10-CM

## 2012-02-19 NOTE — Assessment & Plan Note (Signed)
Stable with crestor and good diet Disc goals for lipids and reasons to control them Rev labs with pt Rev low sat fat diet in detail

## 2012-02-19 NOTE — Patient Instructions (Addendum)
Pap done today Labs including cholesterol look good  Take care of yourself - with diet and exercise as tolerated

## 2012-02-19 NOTE — Assessment & Plan Note (Signed)
dexa 2012 stable On arimedex D level ok  No falls or fx

## 2012-02-19 NOTE — Assessment & Plan Note (Signed)
bp in fair control at this time  No changes needed  Disc lifstyle change with low sodium diet and exercise  Labs reviewed  

## 2012-02-19 NOTE — Progress Notes (Signed)
Subjective:    Patient ID: Madison Huerta, female    DOB: [birth year], 71 y.o.   MRN: [id number]  HPI Here for check up of chronic medical conditions and to review health mt list   Wt is stable with bmi of 25  mammo 10/13- recurrence  Hx of breast cancer Had a mastectomy - and did very well after that  Hanging in there with breast cancer follow up - and is on arimidex  Will see new oncologist in march - she is ready to start back with some exercise  Is utd on her dexa   Last pap  Still has all her parts  Time for her 3 year pap smear   colonosc 7/13  dexa 12 Osteopenia  D level is 65 Falls- none at all and no fracture   Mood - overall very good despite cancer , and she stays very motivated   bp is stable today  No cp or palpitations or headaches or edema  No side effects to medicines   (always has a bit of whitecoat syndrome here) BP Readings from Last 3 Encounters:  02/19/12 144/78  02/09/12 158/77  01/27/12 154/72      Hyperlipidemia crestor and diet  Lab Results  Component Value Date   CHOL 197 02/09/2012   CHOL 197 02/18/2011   CHOL 209* 02/06/2010   Lab Results  Component Value Date   HDL 66.40 02/09/2012   HDL 73.30 02/18/2011   HDL 76.50 02/06/2010   Lab Results  Component Value Date   LDLCALC 102* 02/09/2012   LDLCALC 103* 02/18/2011   LDLCALC 109* 02/11/2009   Lab Results  Component Value Date   TRIG 141.0 02/09/2012   TRIG 106.0 02/18/2011   TRIG 112.0 02/06/2010   Lab Results  Component Value Date   CHOLHDL 3 02/09/2012   CHOLHDL 3 02/18/2011   CHOLHDL 3 02/06/2010   Lab Results  Component Value Date   LDLDIRECT 107.1 02/06/2010    Very stable overall Good diet   Patient Active Problem List  Diagnosis  . UNSPECIFIED HYPOTHYROIDISM  . UNSPECIFIED DISEASE OF THYMUS GLAND  . UNSPECIFIED VITAMIN D DEFICIENCY  . HYPERLIPIDEMIA  . HYPERTENSION  . OSTEOPENIA  . BREAST CANCER, HX OF  . VENTRICULAR SEPTAL DEFECT, HX OF  . Cough    . Heartburn  . PND (post-nasal drip)  . Mass of right breast  . Carcinoma of lower-inner quadrant of breast   Past Medical History  Diagnosis Date  . Hyperlipidemia   . Hypertension   . Thyroid disease     Hypothyroidism  . Osteopenia   . History of breast cancer   . Overweight   . VSD (ventricular septal defect)     does need SBE prophylaxis (Keflex  3 gm 1 hour pprior and 1.5 gm 6 hours post  . Heart murmur     VSD- echocardiogram - [facility] 10 yrs. ago  . Hypothyroidism   . GERD (gastroesophageal reflux disease)   . Breast cancer     L breast- 1994- lumpectomy, care [facility] at [facility]   . VSD (ventricular septal defect)    Past Surgical History  Procedure Date  . Breast lumpectomy 1994    Radiation  . Parathyroidectomy 12/2002  . 2d echo 2004    Normal at [facility]  . Breast implant removal 1994  . Tonsillectomy     As a child  . Tubal ligation     1976  . Mastectomy 01/08/2012  RIGHT  . Mastectomy w/ sentinel node biopsy 01/08/2012    Procedure: MASTECTOMY WITH SENTINEL LYMPH NODE BIOPSY;  Surgeon: Lodema Pilot, DO;  Location: [facility] OR;  Service: General;  Laterality: Right;  right breast mastectomy with sentinel lymph node biopsy    History  Substance Use Topics  . Smoking status: Never Smoker   . Smokeless tobacco: Never Used  . Alcohol Use: Yes     Comment: 1 glass of wine daily   Family History  Problem Relation Age of Onset  . Aneurysm Mother     brain  . Cancer Father     Lung, Liver mets  . Lung cancer Father   . Heart disease Other   . Cancer Cousin     Breast CA   Allergies  Allergen Reactions  . Codeine Other (See Comments)    severe headache  . Penicillins Rash  . Adhesive (Tape) Rash   Current Outpatient Prescriptions on File Prior to Visit  Medication Sig Dispense Refill  . amLODipine (NORVASC) 5 MG tablet take 1 tablet by mouth once daily  30 tablet  5  . anastrozole (ARIMIDEX) 1 MG tablet Take 1 tablet (1 mg total) by mouth daily.  30 tablet   6  . Cholecalciferol (VITAMIN D) 2000 UNITS tablet Take 2,000 Units by mouth daily.      . CRESTOR 10 MG tablet take 1 tablet by mouth once daily  30 each  5  . HYDROcodone-acetaminophen (NORCO/VICODIN) 5-325 MG per tablet Take 1-2 tablets by mouth every 4 (four) hours as needed for pain.  50 tablet  1  . levothyroxine (SYNTHROID, LEVOTHROID) 75 MCG tablet take 1 tablet by mouth once daily  30 tablet  1  . omeprazole (PRILOSEC OTC) 20 MG tablet Take 20 mg by mouth as needed. For heartburn, reflux           Review of Systems Review of Systems  Constitutional: Negative for fever, appetite change, fatigue and unexpected weight change.  Eyes: Negative for pain and visual disturbance.  Respiratory: Negative for cough and shortness of breath.   Cardiovascular: Negative for cp or palpitations    Gastrointestinal: Negative for nausea, diarrhea and constipation.  Genitourinary: Negative for urgency and frequency.  Skin: Negative for pallor or rash   Neurological: Negative for weakness, light-headedness, numbness and headaches.  Hematological: Negative for adenopathy. Does not bruise/bleed easily.  Psychiatric/Behavioral: Negative for dysphoric mood. The patient is not nervous/anxious.         Objective:   Physical Exam  Constitutional: She appears well-developed and well-nourished. No distress.  HENT:  Head: Normocephalic and atraumatic.  Right Ear: External ear normal.  Left Ear: External ear normal.  Nose: Nose normal.  Mouth/Throat: Oropharynx is clear and moist.  Eyes: Conjunctivae normal and EOM are normal. Pupils are equal, round, and reactive to light. Right eye exhibits no discharge. Left eye exhibits no discharge. No scleral icterus.  Neck: Normal range of motion. Neck supple. No JVD present. No thyromegaly present.  Cardiovascular: Normal rate, regular rhythm, normal heart sounds and intact distal pulses.  Exam reveals no gallop.   Pulmonary/Chest: Effort normal and breath  sounds normal. No respiratory distress. She has no wheezes. She exhibits no tenderness.  Abdominal: Soft. Bowel sounds are normal. She exhibits no distension and no mass. There is no tenderness.  Genitourinary: There is no rash, tenderness or lesion on the right labia. There is no rash, tenderness or lesion on the left labia. Uterus is not enlarged  and not tender. Cervix exhibits no motion tenderness, no discharge and no friability. Right adnexum displays no mass, no tenderness and no fullness. Left adnexum displays no mass, no tenderness and no fullness. No erythema or bleeding around the vagina. No vaginal discharge found.  Musculoskeletal: She exhibits no edema and no tenderness.  Lymphadenopathy:    She has no cervical adenopathy.  Neurological: She is alert. She has normal reflexes. No cranial nerve deficit. She exhibits normal muscle tone. Coordination normal.  Skin: Skin is warm and dry. No rash noted. No erythema. No pallor.  Psychiatric: She has a normal mood and affect.          Assessment & Plan:

## 2012-02-19 NOTE — Assessment & Plan Note (Signed)
Hypothyroidism  Pt has no clinical changes No change in energy level/ hair or skin/ edema and no tremor Lab Results  Component Value Date   TSH 0.57 02/09/2012

## 2012-02-19 NOTE — Assessment & Plan Note (Signed)
Doing well after recent R mastectomy for recurrent ca Now on arimidex Had recent exam-not done today

## 2012-02-19 NOTE — Assessment & Plan Note (Signed)
3 year pap and exam done No c/o

## 2012-02-19 NOTE — Assessment & Plan Note (Signed)
D level ok on current supplementation Disc imp for bone and overall health

## 2012-02-23 ENCOUNTER — Encounter: Payer: Self-pay | Admitting: Oncology

## 2012-02-25 ENCOUNTER — Encounter: Payer: Self-pay | Admitting: *Deleted

## 2012-02-27 ENCOUNTER — Encounter: Payer: Self-pay | Admitting: Oncology

## 2012-02-29 ENCOUNTER — Encounter: Payer: Medicare Other | Admitting: Genetic Counselor

## 2012-02-29 ENCOUNTER — Other Ambulatory Visit: Payer: Medicare Other | Admitting: Lab

## 2012-03-05 ENCOUNTER — Other Ambulatory Visit: Payer: Self-pay | Admitting: Family Medicine

## 2012-03-20 ENCOUNTER — Other Ambulatory Visit: Payer: Self-pay | Admitting: *Deleted

## 2012-03-20 DIAGNOSIS — C50319 Malignant neoplasm of lower-inner quadrant of unspecified female breast: Secondary | ICD-10-CM

## 2012-05-06 ENCOUNTER — Ambulatory Visit (HOSPITAL_BASED_OUTPATIENT_CLINIC_OR_DEPARTMENT_OTHER): Payer: Medicare Other | Admitting: Oncology

## 2012-05-06 ENCOUNTER — Encounter: Payer: Self-pay | Admitting: Oncology

## 2012-05-06 ENCOUNTER — Telehealth: Payer: Self-pay | Admitting: Oncology

## 2012-05-06 ENCOUNTER — Other Ambulatory Visit (HOSPITAL_BASED_OUTPATIENT_CLINIC_OR_DEPARTMENT_OTHER): Payer: Medicare Other | Admitting: Lab

## 2012-05-06 VITALS — BP 150/77 | HR 75 | Temp 97.6°F | Resp 20 | Ht 60.5 in | Wt 133.6 lb

## 2012-05-06 DIAGNOSIS — C50319 Malignant neoplasm of lower-inner quadrant of unspecified female breast: Secondary | ICD-10-CM

## 2012-05-06 DIAGNOSIS — Z170 Estrogen receptor positive status [ER+]: Secondary | ICD-10-CM

## 2012-05-06 DIAGNOSIS — Z853 Personal history of malignant neoplasm of breast: Secondary | ICD-10-CM

## 2012-05-06 DIAGNOSIS — C50119 Malignant neoplasm of central portion of unspecified female breast: Secondary | ICD-10-CM

## 2012-05-06 DIAGNOSIS — C50311 Malignant neoplasm of lower-inner quadrant of right female breast: Secondary | ICD-10-CM

## 2012-05-06 DIAGNOSIS — C773 Secondary and unspecified malignant neoplasm of axilla and upper limb lymph nodes: Secondary | ICD-10-CM

## 2012-05-06 LAB — CBC WITH DIFFERENTIAL/PLATELET
Basophils Absolute: 0.1 10*3/uL (ref 0.0–0.1)
EOS%: 1.5 % (ref 0.0–7.0)
HCT: 40 % (ref 34.8–46.6)
HGB: 13.3 g/dL (ref 11.6–15.9)
LYMPH%: 28.3 % (ref 14.0–49.7)
MCH: 30 pg (ref 25.1–34.0)
MCV: 90.1 fL (ref 79.5–101.0)
MONO%: 7.4 % (ref 0.0–14.0)
NEUT%: 61.6 % (ref 38.4–76.8)
RDW: 12.9 % (ref 11.2–14.5)

## 2012-05-06 LAB — COMPREHENSIVE METABOLIC PANEL (CC13)
Alkaline Phosphatase: 83 U/L (ref 40–150)
BUN: 11.9 mg/dL (ref 7.0–26.0)
Creatinine: 0.9 mg/dL (ref 0.6–1.1)
Glucose: 93 mg/dl (ref 70–99)
Total Bilirubin: 0.72 mg/dL (ref 0.20–1.20)

## 2012-05-06 MED ORDER — ANASTROZOLE 1 MG PO TABS: 1.0000 mg | ORAL_TABLET | Freq: Every day | ORAL | Status: DC

## 2012-05-06 NOTE — Patient Instructions (Addendum)
Continue arimidex 1 mg daily  I will see you back in 6 months 

## 2012-05-06 NOTE — Telephone Encounter (Signed)
gv pt appt schedule for September.  °

## 2012-05-06 NOTE — Progress Notes (Signed)
OFFICE PROGRESS NOTE  CC  [name], MD [address] [place] [address] [place] [address] Dr. Lodema Pilot Dr. Lurline Hare  DIAGNOSIS: 72 year old female with prior history of left breast cancer treated with lumpectomy radiation and axillary lymph node dissection in 1994 with completion of 5 years of tamoxifen. She subsequently was seen in the multidisciplinary breast clinic with a new diagnosis of right breast mass. 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  PRIOR THERAPY:  #1 patient has a wire history of left breast cancer treated with lumpectomy radiation and axillary lymph node dissection 1994 followed by adjuvant antiestrogen therapy consisting of tamoxifen.  #2 she was seen in the [facility] clinic after having undergone a routine screening mammogram that showed a right breast mass. There were 2 areas of concern 2.3 cm apart. Biopsy of both of these showed invasive ductal carcinoma. Both tumors were strongly ER and PR receptor positive HER-2/neu negative. Ki-67 was 5% and 14% with associated DCIS and calcifications. MRI of the breast showed a 1.3 x 1.0 x 0.8 cm linear at enhancement extended from this mass measuring 5.2 x 3.0 x 1.8 cm.  #3 patient went on to have a mastectomy performed on 01/08/2012. The final pathology revealed multifocal disease with the measurements of the tumor at 1.2 cm, 1.0 cm, and 0.5 cm. 0 of 2 lymph nodes were positive for metastatic disease. Tumor was ER +100% and 97% PR +100% or 97% HER-2/neu negative with a ratio 1.19 and 1.44. Ki-67 was 5 and 14%. Final pathologic staging was T1 C. N0 multifocal disease.  #4 patient then had an Oncotype DX testing performed her breast cancer recurrence score was 16 giving her a 10% chance of distance recurrence with 5 years of tamoxifen. It was felt by Dr. Donnie Coffin that patient would not benefit from adjuvant chemotherapy. Therefore she was  started on adjuvant antiestrogen therapy consisting of Arimidex 1 mg daily. She began this December 2013. Overall she is tolerating it well. Total of 5 years therapy is planned.  CURRENT THERAPY:Arimidex 1 mg daily  INTERVAL HISTORY: KRYSTN DERMODY 72 y.o. female returns for followup visit and to establish her care with me. Prior to me she was being followed by Dr. Donnie Coffin. Clinically she's doing well she has no aches pains no fevers chills or night sweats. She is tolerating Arimidex without any problems. She has no myalgias and arthralgias. No vaginal discharge or bleeding. She is exercising and eating well. Remainder of the 10 point review of systems is negative.  MEDICAL HISTORY: Past Medical History  Diagnosis Date  . Hyperlipidemia   . Hypertension   . Thyroid disease     Hypothyroidism  . Osteopenia   . History of breast cancer   . Overweight   . VSD (ventricular septal defect)     does need SBE prophylaxis (Keflex  3 gm 1 hour pprior and 1.5 gm 6 hours post  . Heart murmur     VSD- echocardiogram - [facility] 10 yrs. ago  . Hypothyroidism   . GERD (gastroesophageal reflux disease)   . Breast cancer     L breast- 1994- lumpectomy, care for at [facility]   . VSD (ventricular septal defect)     ALLERGIES:  is allergic to codeine; penicillins; and adhesive.  MEDICATIONS:  Current Outpatient Prescriptions  Medication Sig Dispense Refill  . acetaminophen (TYLENOL) 325 MG tablet Take 650 mg by mouth as needed.      Marland Kitchen  amLODipine (NORVASC) 5 MG tablet take 1 tablet by mouth once daily  30 tablet  5  . anastrozole (ARIMIDEX) 1 MG tablet Take 1 tablet (1 mg total) by mouth daily.  30 tablet  6  . Cholecalciferol (VITAMIN D) 2000 UNITS tablet Take 2,000 Units by mouth daily.      . CRESTOR 10 MG tablet take 1 tablet by mouth once daily  30 each  5  . levothyroxine (SYNTHROID, LEVOTHROID) 75 MCG tablet take 1 tablet by mouth once daily  30 tablet  11  . omeprazole (PRILOSEC OTC) 20 MG tablet Take  20 mg by mouth as needed. For heartburn, reflux       No current facility-administered medications for this visit.    SURGICAL HISTORY:  Past Surgical History  Procedure Laterality Date  . Breast lumpectomy  1994    Radiation  . Parathyroidectomy  12/2002  . 2d echo  2004    Normal at [facility]  . Breast implant removal  1994  . Tonsillectomy      As a child  . Tubal ligation      1976  . Mastectomy  01/08/2012    RIGHT  . Mastectomy w/ sentinel node biopsy  01/08/2012    Procedure: MASTECTOMY WITH SENTINEL LYMPH NODE BIOPSY;  Surgeon: Lodema Pilot, DO;  Location: [facility] OR;  Service: General;  Laterality: Right;  right breast mastectomy with sentinel lymph node biopsy     REVIEW OF SYSTEMS:  Pertinent items are noted in HPI.   HEALTH MAINTENANCE:  PHYSICAL EXAMINATION: Blood pressure 150/77, pulse 75, temperature 97.6 F (36.4 C), temperature source Oral, resp. rate 20, height 5' 0.5" (1.537 m), weight 133 lb 9.6 oz (60.601 kg). Body mass index is 25.65 kg/(m^2). ECOG PERFORMANCE STATUS: 0 - Asymptomatic   General appearance: alert, cooperative and appears stated age Resp: clear to auscultation bilaterally Cardio: regular rate and rhythm GI: soft, non-tender; bowel sounds normal; no masses,  no organomegaly Extremities: extremities normal, atraumatic, no cyanosis or edema Neurologic: Grossly normal Left breast reveals well-healed central lumpectomy scar no nodularity no masses. Right mastectomy scar is healing well without any evidence of local recurrence.  LABORATORY DATA: Lab Results  Component Value Date   WBC 6.6 05/06/2012   HGB 13.3 05/06/2012   HCT 40.0 05/06/2012   MCV 90.1 05/06/2012   PLT 279 05/06/2012      Chemistry      Component Value Date/Time   NA 139 02/09/2012 0824   NA 141 12/16/2011 0828   K 3.6 02/09/2012 0824   K 3.7 12/16/2011 0828   CL 105 02/09/2012 0824   CL 105 12/16/2011 0828   CO2 27 02/09/2012 0824   CO2 24 12/16/2011 0828   BUN 17  02/09/2012 0824   BUN 16.0 12/16/2011 0828   CREATININE 0.9 02/09/2012 0824   CREATININE 0.9 12/16/2011 0828      Component Value Date/Time   CALCIUM 9.4 02/09/2012 0824   CALCIUM 9.7 12/16/2011 0828   ALKPHOS 66 02/09/2012 0824   ALKPHOS 81 12/16/2011 0828   AST 25 02/09/2012 0824   AST 26 12/16/2011 0828   ALT 16 02/09/2012 0824   ALT 18 12/16/2011 0828   BILITOT 0.7 02/09/2012 0824   BILITOT 0.80 12/16/2011 0828       RADIOGRAPHIC STUDIES:  No results found.  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 Her final pathology at mastectomy  showed multifocal disease the largest one measuring 1.2 cm. Tumors were ER positive PR positive HER-2/neu negative with unfavorable Ki-67. Patient since diagnosis since December 2013 has been receiving Arimidex 1 mg daily adjuvantly. Overall she's tolerating it well. She has no evidence of recurrent disease.   PLAN:   #1 patient will continue Arimidex daily.  #2 she will be due for mammogram in October of this year.  #3 I will see her back in 6 months time.   All questions were answered. The patient knows to call the clinic with any problems, questions or concerns. We can certainly see the patient much sooner if necessary.  I spent 40 minutes counseling the patient face to face. The total time spent in the appointment was 40 minutes.    Drue Second, MD Medical/Oncology [facility] [phone] (beeper) [phone] (Office)  05/06/2012, 3:03 PM

## 2012-05-24 ENCOUNTER — Encounter: Payer: Self-pay | Admitting: Medical Oncology

## 2012-05-24 ENCOUNTER — Telehealth: Payer: Self-pay | Admitting: Medical Oncology

## 2012-05-24 NOTE — Telephone Encounter (Signed)
Patient called concerned that she has not been contacted regarding having an appt sched for mammogram in September, which is due toward the end of September. Informed patient will review with MD and call her back.   Patient with sched appt to see MD 11/10/12

## 2012-05-25 NOTE — Telephone Encounter (Signed)
Please let her know that they will contact her for the mammogram

## 2012-05-27 NOTE — Telephone Encounter (Signed)
Per MD, informed pt she will be contacted for a mammogram appt, asked patient to call back if she has not been contacted within the next week or two. Patient verbalized understanding. No further questions at this time.

## 2012-06-22 ENCOUNTER — Other Ambulatory Visit: Payer: Self-pay | Admitting: Medical Oncology

## 2012-06-24 ENCOUNTER — Telehealth: Payer: Self-pay | Admitting: Medical Oncology

## 2012-06-24 NOTE — Telephone Encounter (Signed)
Patient called asking about mammogram, states she hasn't heard anything about one being sched so she wanted to f/u. I was informed by sched and informed patient that Dr Royden Purl office will be the one's to schedule that appt for her since Dr Lucretia Roers made the original order for mammogram. Patient stated she will f/u with them. Expressed thanks, no further questions at this time.

## 2012-07-11 ENCOUNTER — Telehealth: Payer: Self-pay

## 2012-07-11 DIAGNOSIS — Z853 Personal history of malignant neoplasm of breast: Secondary | ICD-10-CM

## 2012-07-11 NOTE — Telephone Encounter (Signed)
Pt left v/m that she had spoken with Shirlee Limerick and pt needed new order from Dr Milinda Antis sent to [facility] for diagnostic unilateral mammogram ; pt has hx of breast cancer and mastectomy. Pt request call back when order has been sent in and pt will schedule appt herself.

## 2012-07-12 NOTE — Telephone Encounter (Signed)
Diagnostic MMG set up at [facility] on 11/07/12 at 10am. Patient notified. MK

## 2012-07-22 ENCOUNTER — Other Ambulatory Visit: Payer: Self-pay | Admitting: Family Medicine

## 2012-11-07 ENCOUNTER — Ambulatory Visit: Payer: Self-pay | Admitting: Family Medicine

## 2012-11-07 ENCOUNTER — Encounter: Payer: Self-pay | Admitting: Family Medicine

## 2012-11-08 ENCOUNTER — Encounter: Payer: Self-pay | Admitting: *Deleted

## 2012-11-10 ENCOUNTER — Encounter: Payer: Self-pay | Admitting: Oncology

## 2012-11-10 ENCOUNTER — Ambulatory Visit (HOSPITAL_BASED_OUTPATIENT_CLINIC_OR_DEPARTMENT_OTHER): Payer: Medicare Other | Admitting: Oncology

## 2012-11-10 ENCOUNTER — Telehealth: Payer: Self-pay | Admitting: Oncology

## 2012-11-10 ENCOUNTER — Other Ambulatory Visit (HOSPITAL_BASED_OUTPATIENT_CLINIC_OR_DEPARTMENT_OTHER): Payer: Medicare Other | Admitting: Lab

## 2012-11-10 VITALS — BP 146/71 | HR 72 | Temp 97.9°F | Resp 20 | Ht 60.5 in | Wt 132.5 lb

## 2012-11-10 DIAGNOSIS — R21 Rash and other nonspecific skin eruption: Secondary | ICD-10-CM

## 2012-11-10 DIAGNOSIS — Z853 Personal history of malignant neoplasm of breast: Secondary | ICD-10-CM

## 2012-11-10 DIAGNOSIS — M899 Disorder of bone, unspecified: Secondary | ICD-10-CM

## 2012-11-10 DIAGNOSIS — C50319 Malignant neoplasm of lower-inner quadrant of unspecified female breast: Secondary | ICD-10-CM

## 2012-11-10 LAB — COMPREHENSIVE METABOLIC PANEL (CC13)
Albumin: 4.1 g/dL (ref 3.5–5.0)
BUN: 18.2 mg/dL (ref 7.0–26.0)
CO2: 26 mEq/L (ref 22–29)
Calcium: 9.8 mg/dL (ref 8.4–10.4)
Chloride: 107 mEq/L (ref 98–109)
Creatinine: 1 mg/dL (ref 0.6–1.1)

## 2012-11-10 LAB — CBC WITH DIFFERENTIAL/PLATELET
Basophils Absolute: 0.1 10*3/uL (ref 0.0–0.1)
Eosinophils Absolute: 0.1 10*3/uL (ref 0.0–0.5)
HCT: 40.1 % (ref 34.8–46.6)
HGB: 13.5 g/dL (ref 11.6–15.9)
MONO#: 0.6 10*3/uL (ref 0.1–0.9)
NEUT#: 3.2 10*3/uL (ref 1.5–6.5)
NEUT%: 56.9 % (ref 38.4–76.8)
WBC: 5.6 10*3/uL (ref 3.9–10.3)
lymph#: 1.5 10*3/uL (ref 0.9–3.3)

## 2012-11-10 NOTE — Patient Instructions (Addendum)
Refer to dermatologist  I will see you back in 6 months

## 2012-11-13 NOTE — Progress Notes (Signed)
OFFICE PROGRESS NOTE  CC  [name], MD [address] [place] [address] [place] [address] Dr. Lodema Pilot Dr. Lurline Hare  DIAGNOSIS: 72 year old female with prior history of left breast cancer treated with lumpectomy radiation and axillary lymph node dissection in 1994 with completion of 5 years of tamoxifen. She subsequently was seen in the multidisciplinary breast clinic with a new diagnosis of right breast mass. 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  PRIOR THERAPY:  #1 patient has a wire history of left breast cancer treated with lumpectomy radiation and axillary lymph node dissection 1994 followed by adjuvant antiestrogen therapy consisting of tamoxifen.  #2 she was seen in the [facility] clinic after having undergone a routine screening mammogram that showed a right breast mass. There were 2 areas of concern 2.3 cm apart. Biopsy of both of these showed invasive ductal carcinoma. Both tumors were strongly ER and PR receptor positive HER-2/neu negative. Ki-67 was 5% and 14% with associated DCIS and calcifications. MRI of the breast showed a 1.3 x 1.0 x 0.8 cm linear at enhancement extended from this mass measuring 5.2 x 3.0 x 1.8 cm.  #3 patient went on to have a mastectomy performed on 01/08/2012. The final pathology revealed multifocal disease with the measurements of the tumor at 1.2 cm, 1.0 cm, and 0.5 cm. 0 of 2 lymph nodes were positive for metastatic disease. Tumor was ER +100% and 97% PR +100% or 97% HER-2/neu negative with a ratio 1.19 and 1.44. Ki-67 was 5 and 14%. Final pathologic staging was T1 C. N0 multifocal disease.  #4 patient then had an Oncotype DX testing performed her breast cancer recurrence score was 16 giving her a 10% chance of distance recurrence with 5 years of tamoxifen. It was felt by Dr. Donnie Coffin that patient would not benefit from adjuvant chemotherapy. Therefore she was  started on adjuvant antiestrogen therapy consisting of Arimidex 1 mg daily. She began this December 2013. Overall she is tolerating it well. Total of 5 years therapy is planned.  CURRENT THERAPY:Arimidex 1 mg daily  INTERVAL HISTORY: Madison Huerta 72 y.o. female returns for followup visit and to establish her care with me. Prior to me she was being followed by Dr. Donnie Coffin. Clinically she's doing well she has no aches pains no fevers chills or night sweats. She is tolerating Arimidex without any problems. She has no myalgias and arthralgias. No vaginal discharge or bleeding. She is exercising and eating well. Remainder of the 10 point review of systems is negative.  MEDICAL HISTORY: Past Medical History  Diagnosis Date  . Hyperlipidemia   . Hypertension   . Thyroid disease     Hypothyroidism  . Osteopenia   . History of breast cancer   . Overweight(278.02)   . VSD (ventricular septal defect)     does need SBE prophylaxis (Keflex  3 gm 1 hour pprior and 1.5 gm 6 hours post  . Heart murmur     VSD- echocardiogram - [facility] 10 yrs. ago  . Hypothyroidism   . GERD (gastroesophageal reflux disease)   . Breast cancer     L breast- 1994- lumpectomy, care for at [facility]   . VSD (ventricular septal defect)     ALLERGIES:  is allergic to codeine; penicillins; and adhesive.  MEDICATIONS:  Current Outpatient Prescriptions  Medication Sig Dispense Refill  . acetaminophen (TYLENOL) 325 MG tablet Take 650 mg by mouth as needed.      Marland Kitchen  amLODipine (NORVASC) 5 MG tablet take 1 tablet by mouth once daily  30 tablet  6  . anastrozole (ARIMIDEX) 1 MG tablet Take 1 tablet (1 mg total) by mouth daily.  90 tablet  12  . Cholecalciferol (VITAMIN D) 2000 UNITS tablet Take 2,000 Units by mouth daily.      . CRESTOR 10 MG tablet take 1 tablet by mouth once daily  30 tablet  6  . levothyroxine (SYNTHROID, LEVOTHROID) 75 MCG tablet take 1 tablet by mouth once daily  30 tablet  11   No current facility-administered  medications for this visit.    SURGICAL HISTORY:  Past Surgical History  Procedure Laterality Date  . Breast lumpectomy  1994    Radiation  . Parathyroidectomy  12/2002  . 2d echo  2004    [facility] at [facility]  . Breast implant removal  1994  . Tonsillectomy      As a child  . Tubal ligation      1976  . Mastectomy  01/08/2012    RIGHT  . Mastectomy w/ sentinel node biopsy  01/08/2012    Procedure: MASTECTOMY WITH SENTINEL LYMPH NODE BIOPSY;  Surgeon: Lodema Pilot, DO;  Location: [facility] OR;  Service: General;  Laterality: Right;  right breast mastectomy with sentinel lymph node biopsy     REVIEW OF SYSTEMS:  Pertinent items are noted in HPI.   HEALTH MAINTENANCE:  PHYSICAL EXAMINATION: Blood pressure 146/71, pulse 72, temperature 97.9 F (36.6 C), temperature source Oral, resp. rate 20, height 5' 0.5" (1.537 m), weight 132 lb 8 oz (60.102 kg). Body mass index is 25.44 kg/(m^2). ECOG PERFORMANCE STATUS: 0 - Asymptomatic   General appearance: alert, cooperative and appears stated age Resp: clear to auscultation bilaterally Cardio: regular rate and rhythm GI: soft, non-tender; bowel sounds normal; no masses,  no organomegaly Extremities: extremities normal, atraumatic, no cyanosis or edema Neurologic: Grossly normal Left breast reveals well-healed central lumpectomy scar no nodularity no masses. Right mastectomy scar is healing well without any evidence of local recurrence.  LABORATORY DATA: Lab Results  Component Value Date   WBC 5.6 11/10/2012   HGB 13.5 11/10/2012   HCT 40.1 11/10/2012   MCV 89.4 11/10/2012   PLT 272 11/10/2012      Chemistry      Component Value Date/Time   NA 142 11/10/2012 0821   NA 139 02/09/2012 0824   K 3.9 11/10/2012 0821   K 3.6 02/09/2012 0824   CL 105 05/06/2012 1433   CL 105 02/09/2012 0824   CO2 26 11/10/2012 0821   CO2 27 02/09/2012 0824   BUN 18.2 11/10/2012 0821   BUN 17 02/09/2012 0824   CREATININE 1.0 11/10/2012 0821   CREATININE 0.9  02/09/2012 0824      Component Value Date/Time   CALCIUM 9.8 11/10/2012 0821   CALCIUM 9.4 02/09/2012 0824   ALKPHOS 71 11/10/2012 0821   ALKPHOS 66 02/09/2012 0824   AST 27 11/10/2012 0821   AST 25 02/09/2012 0824   ALT 16 11/10/2012 0821   ALT 16 02/09/2012 0824   BILITOT 0.74 11/10/2012 0821   BILITOT 0.7 02/09/2012 0824       RADIOGRAPHIC STUDIES:  No results found.  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 Her final pathology at mastectomy showed multifocal disease the largest one measuring 1.2 cm. Tumors were ER positive PR positive HER-2/neu negative with unfavorable Ki-67. Patient since  diagnosis since December 2013 has been receiving Arimidex 1 mg daily adjuvantly. Overall she's tolerating it well. She has no evidence of recurrent disease.   PLAN:   #1 patient will continue Arimidex daily.  #2 she will be due for mammogram in October of this year.  #3 I will see her back in 6 months time.   All questions were answered. The patient knows to call the clinic with any problems, questions or concerns. We can certainly see the patient much sooner if necessary.  I spent 20 minutes counseling the patient face to face. The total time spent in the appointment was 25 minutes.    Drue Second, MD Medical/Oncology [facility] [phone] (beeper) [phone] (Office)

## 2013-02-12 ENCOUNTER — Telehealth: Payer: Self-pay | Admitting: Family Medicine

## 2013-02-12 DIAGNOSIS — E039 Hypothyroidism, unspecified: Secondary | ICD-10-CM

## 2013-02-12 DIAGNOSIS — E559 Vitamin D deficiency, unspecified: Secondary | ICD-10-CM

## 2013-02-12 DIAGNOSIS — I10 Essential (primary) hypertension: Secondary | ICD-10-CM

## 2013-02-12 DIAGNOSIS — E785 Hyperlipidemia, unspecified: Secondary | ICD-10-CM

## 2013-02-12 NOTE — Telephone Encounter (Signed)
Message copied by Judy Pimple on Sun Feb 12, 2013 12:25 PM ------      Message from: Alvina Chou      Created: Tue Feb 07, 2013  3:24 PM      Regarding: Lab orders for Monday, 12.22.14       Patient is scheduled for CPX labs, please order future labs, Thanks , Terri       ------

## 2013-02-13 ENCOUNTER — Other Ambulatory Visit (INDEPENDENT_AMBULATORY_CARE_PROVIDER_SITE_OTHER): Payer: Medicare Other

## 2013-02-13 DIAGNOSIS — I10 Essential (primary) hypertension: Secondary | ICD-10-CM

## 2013-02-13 DIAGNOSIS — E559 Vitamin D deficiency, unspecified: Secondary | ICD-10-CM

## 2013-02-13 DIAGNOSIS — E039 Hypothyroidism, unspecified: Secondary | ICD-10-CM

## 2013-02-13 DIAGNOSIS — E785 Hyperlipidemia, unspecified: Secondary | ICD-10-CM

## 2013-02-13 LAB — COMPREHENSIVE METABOLIC PANEL
ALT: 23 U/L (ref 0–35)
CO2: 27 mEq/L (ref 19–32)
Calcium: 9.6 mg/dL (ref 8.4–10.5)
Chloride: 105 mEq/L (ref 96–112)
Creatinine, Ser: 1 mg/dL (ref 0.4–1.2)
GFR: 61.69 mL/min (ref >60.00)
Total Protein: 7.6 g/dL (ref 6.0–8.3)

## 2013-02-13 LAB — CBC WITH DIFFERENTIAL/PLATELET
Basophils Relative: 0.7 % (ref 0.0–3.0)
Eosinophils Relative: 2.2 % (ref 0.0–5.0)
Hemoglobin: 13.7 g/dL (ref 12.0–15.0)
Lymphocytes Relative: 31.6 % (ref 12.0–46.0)
MCV: 89.4 fl (ref 78.0–100.0)
Monocytes Absolute: 0.5 10*3/uL (ref 0.1–1.0)
Monocytes Relative: 8.4 % (ref 3.0–12.0)
Neutro Abs: 3.1 10*3/uL (ref 1.4–7.7)
RBC: 4.59 Mil/uL (ref 3.87–5.11)
WBC: 5.4 10*3/uL (ref 4.5–10.5)

## 2013-02-13 LAB — LIPID PANEL
Cholesterol: 197 mg/dL (ref 0–200)
HDL: 71.5 mg/dL (ref >39.00)
Triglycerides: 127 mg/dL (ref 0.0–149.0)
VLDL: 25.4 mg/dL (ref 0.0–40.0)

## 2013-02-13 LAB — TSH: TSH: 0.56 u[IU]/mL (ref 0.35–5.50)

## 2013-02-14 LAB — VITAMIN D 25 HYDROXY (VIT D DEFICIENCY, FRACTURES): Vit D, 25-Hydroxy: 68 ng/mL (ref 30–89)

## 2013-02-17 ENCOUNTER — Other Ambulatory Visit: Payer: Self-pay | Admitting: Family Medicine

## 2013-02-20 ENCOUNTER — Ambulatory Visit (INDEPENDENT_AMBULATORY_CARE_PROVIDER_SITE_OTHER): Payer: Medicare Other | Admitting: Family Medicine

## 2013-02-20 ENCOUNTER — Encounter: Payer: Self-pay | Admitting: Family Medicine

## 2013-02-20 VITALS — BP 132/78 | HR 78 | Temp 98.1°F | Ht 60.5 in | Wt 133.5 lb

## 2013-02-20 DIAGNOSIS — I10 Essential (primary) hypertension: Secondary | ICD-10-CM

## 2013-02-20 DIAGNOSIS — R05 Cough: Secondary | ICD-10-CM

## 2013-02-20 DIAGNOSIS — M899 Disorder of bone, unspecified: Secondary | ICD-10-CM

## 2013-02-20 DIAGNOSIS — E039 Hypothyroidism, unspecified: Secondary | ICD-10-CM

## 2013-02-20 DIAGNOSIS — Z0000 Encounter for general adult medical examination without abnormal findings: Secondary | ICD-10-CM | POA: Insufficient documentation

## 2013-02-20 DIAGNOSIS — R059 Cough, unspecified: Secondary | ICD-10-CM

## 2013-02-20 DIAGNOSIS — E559 Vitamin D deficiency, unspecified: Secondary | ICD-10-CM

## 2013-02-20 DIAGNOSIS — E785 Hyperlipidemia, unspecified: Secondary | ICD-10-CM

## 2013-02-20 MED ORDER — ROSUVASTATIN CALCIUM 10 MG PO TABS: 10.0000 mg | ORAL_TABLET | Freq: Every day | ORAL | Status: DC

## 2013-02-20 MED ORDER — LEVOTHYROXINE SODIUM 75 MCG PO TABS: 75.0000 ug | ORAL_TABLET | Freq: Every day | ORAL | Status: DC

## 2013-02-20 MED ORDER — AMLODIPINE BESYLATE 5 MG PO TABS: 5.0000 mg | ORAL_TABLET | Freq: Every day | ORAL | Status: DC

## 2013-02-20 NOTE — Assessment & Plan Note (Signed)
Disc goals for lipids and reasons to control them Rev labs with pt Rev low sat fat diet in detail   

## 2013-02-20 NOTE — Progress Notes (Signed)
Subjective:    Patient ID: Madison Huerta, female    DOB: 11/24/1940, 72 y.o.   MRN: [id number]  HPI I have personally reviewed the Medicare Annual Wellness questionnaire and have noted 1. The patient's medical and social history 2. Their use of alcohol, tobacco or illicit drugs 3. Their current medications and supplements 4. The patient's functional ability including ADL's, fall risks, home safety risks and hearing or visual             impairment. 5. Diet and physical activities 6. Evidence for depression or mood disorders  The patients weight, height, BMI have been recorded in the chart and visual acuity is per eye clinic.  I have made referrals, counseling and provided education to the patient based review of the above and I have provided the pt with a written personalized care plan for preventive services.  She has a hacking cough and drainage - worse cough when she talks  All mucous is clear  ? Allergies or a cold No heartburn    See scanned forms.  Routine anticipatory guidance given to patient.  See health maintenance. Flu 9/14 Shingles 12/04 vaccine  PNA- 12/11 vaccine Tetanus 12/12  Colonoscopy 7/13 Dr Troy Sine  Breast cancer screening 9/14 -- hx of breast cancer / her f/u are going well  No lumps on self exam  Gyn- had a pap 12/13   (no gyn problems)  Does not want to do a pap  Advance directive - she does have a living will  Cognitive function addressed- see scanned forms- and if abnormal then additional documentation follows.  -no concerns with memory   PMH and SH reviewed  Meds, vitals, and allergies reviewed.   ROS: See HPI.  Otherwise negative.    Hypothyroidism  Pt has no clinical changes No change in energy level/ hair or skin/ edema and no tremor Lab Results  Component Value Date   TSH 0.56 02/13/2013     Osteopenia On arimidex currently  2012 dexa = due for her 2 year  D level is 67- good  She is taking her vitamin D    Hyperlipidemia crestor and diet Lab Results  Component Value Date   CHOL 197 02/13/2013   CHOL 197 02/09/2012   CHOL 197 02/18/2011   Lab Results  Component Value Date   HDL 71.50 02/13/2013   HDL 66.40 02/09/2012   HDL 73.30 02/18/2011   Lab Results  Component Value Date   LDLCALC 100* 02/13/2013   LDLCALC 102* 02/09/2012   LDLCALC 103* 02/18/2011   Lab Results  Component Value Date   TRIG 127.0 02/13/2013   TRIG 141.0 02/09/2012   TRIG 106.0 02/18/2011   Lab Results  Component Value Date   CHOLHDL 3 02/13/2013   CHOLHDL 3 02/09/2012   CHOLHDL 3 02/18/2011   Lab Results  Component Value Date   LDLDIRECT 107.1 02/06/2010   very well controlled overall - stable   bp is stable today  No cp or palpitations or headaches or edema  No side effects to medicines  BP Readings from Last 3 Encounters:  02/20/13 132/78  11/10/12 146/71  05/06/12 150/77        Chemistry      Component Value Date/Time   NA 141 02/13/2013 0813   NA 142 11/10/2012 0821   K 4.2 02/13/2013 0813   K 3.9 11/10/2012 0821   CL 105 02/13/2013 0813   CL 105 05/06/2012 1433   CO2 27 02/13/2013 0813  CO2 26 11/10/2012 0821   BUN 13 02/13/2013 0813   BUN 18.2 11/10/2012 0821   CREATININE 1.0 02/13/2013 0813   CREATININE 1.0 11/10/2012 0821      Component Value Date/Time   CALCIUM 9.6 02/13/2013 0813   CALCIUM 9.8 11/10/2012 0821   ALKPHOS 67 02/13/2013 0813   ALKPHOS 71 11/10/2012 0821   AST 33 02/13/2013 0813   AST 27 11/10/2012 0821   ALT 23 02/13/2013 0813   ALT 16 11/10/2012 0821   BILITOT 0.8 02/13/2013 0813   BILITOT 0.74 11/10/2012 0821     glucose 97 Lab Results  Component Value Date   WBC 5.4 02/13/2013   HGB 13.7 02/13/2013   HCT 41.0 02/13/2013   MCV 89.4 02/13/2013   PLT 291.0 02/13/2013       Review of Systems Review of Systems  Constitutional: Negative for fever, appetite change, fatigue and unexpected weight change.  Eyes: Negative for pain and visual  disturbance.  Respiratory: Negative for wheeze and shortness of breath.   Cardiovascular: Negative for cp or palpitations    Gastrointestinal: Negative for nausea, diarrhea and constipation.  Genitourinary: Negative for urgency and frequency.  Skin: Negative for pallor or rash   Neurological: Negative for weakness, light-headedness, numbness and headaches.  Hematological: Negative for adenopathy. Does not bruise/bleed easily.  Psychiatric/Behavioral: Negative for dysphoric mood. The patient is not nervous/anxious.         Objective:   Physical Exam  Constitutional: She appears well-developed and well-nourished. No distress.  HENT:  Head: Normocephalic and atraumatic.  Right Ear: External ear normal.  Left Ear: External ear normal.  Mouth/Throat: Oropharynx is clear and moist.  Eyes: Conjunctivae and EOM are normal. Pupils are equal, round, and reactive to light. No scleral icterus.  Neck: Normal range of motion. Neck supple. No JVD present. Carotid bruit is not present. No thyromegaly present.  Cardiovascular: Normal rate, regular rhythm, normal heart sounds and intact distal pulses.  Exam reveals no gallop.   Pulmonary/Chest: Effort normal and breath sounds normal. No respiratory distress. She has no wheezes. She exhibits no tenderness.  Abdominal: Soft. Bowel sounds are normal. She exhibits no distension, no abdominal bruit and no mass. There is no tenderness.  Genitourinary: No breast swelling, tenderness, discharge or bleeding.  S/p R mastectomy-no lumps in axilla Surgical changes L breast-no acute changes   Musculoskeletal: Normal range of motion. She exhibits no edema and no tenderness.  Lymphadenopathy:    She has no cervical adenopathy.  Neurological: She is alert. She has normal reflexes. No cranial nerve deficit. She exhibits normal muscle tone. Coordination normal.  Skin: Skin is warm and dry. No rash noted. No erythema. No pallor.  Psychiatric: She has a normal mood and  affect.          Assessment & Plan:

## 2013-02-20 NOTE — Assessment & Plan Note (Signed)
May be from post nasal drip  Will try otc antihistamine and update

## 2013-02-20 NOTE — Assessment & Plan Note (Signed)
BP: 132/78 mmHg  bp in fair control at this time  No changes needed Disc lifstyle change with low sodium diet and exercise

## 2013-02-20 NOTE — Progress Notes (Signed)
Pre-visit discussion using our clinic review tool. No additional management support is needed unless otherwise documented below in the visit note.  

## 2013-02-20 NOTE — Assessment & Plan Note (Signed)
Hypothyroidism  Pt has no clinical changes No change in energy level/ hair or skin/ edema and no tremor Lab Results  Component Value Date   TSH 0.56 02/13/2013

## 2013-02-20 NOTE — Assessment & Plan Note (Signed)
Reviewed health habits including diet and exercise and skin cancer prevention Reviewed appropriate screening tests for age  Also reviewed health mt list, fam hx and immunization status , as well as social and family history   Labs reviewed  

## 2013-02-20 NOTE — Assessment & Plan Note (Signed)
Schedule dexa  Disc need for calcium/ vitamin D/ wt bearing exercise and bone density test every 2 y to monitor Disc safety/ fracture risk in detail   Nl D level  No falls/ fx

## 2013-02-20 NOTE — Assessment & Plan Note (Signed)
D level ok  Will continue current supplementation

## 2013-02-20 NOTE — Patient Instructions (Signed)
For cough- try an antihistamine to help post nasal drip (claritin or zyrtec) We will refer you for bone density test at check out  Take care of yourself

## 2013-02-22 ENCOUNTER — Encounter: Payer: Self-pay | Admitting: Family Medicine

## 2013-02-22 ENCOUNTER — Ambulatory Visit: Payer: Self-pay | Admitting: Family Medicine

## 2013-02-22 LAB — HM DEXA SCAN

## 2013-02-24 ENCOUNTER — Encounter: Payer: Self-pay | Admitting: *Deleted

## 2013-02-24 ENCOUNTER — Encounter: Payer: Self-pay | Admitting: Family Medicine

## 2013-03-08 ENCOUNTER — Encounter: Payer: Self-pay | Admitting: Family Medicine

## 2013-03-08 ENCOUNTER — Ambulatory Visit (INDEPENDENT_AMBULATORY_CARE_PROVIDER_SITE_OTHER): Payer: Medicare Other | Admitting: Family Medicine

## 2013-03-08 VITALS — BP 140/72 | HR 63 | Temp 98.0°F | Ht 60.5 in | Wt 133.8 lb

## 2013-03-08 DIAGNOSIS — M899 Disorder of bone, unspecified: Secondary | ICD-10-CM

## 2013-03-08 DIAGNOSIS — M949 Disorder of cartilage, unspecified: Principal | ICD-10-CM

## 2013-03-08 MED ORDER — ALENDRONATE SODIUM 70 MG PO TABS: 70.0000 mg | ORAL_TABLET | ORAL | Status: DC

## 2013-03-08 NOTE — Progress Notes (Signed)
Pre-visit discussion using our clinic review tool. No additional management support is needed unless otherwise documented below in the visit note.  

## 2013-03-08 NOTE — Patient Instructions (Signed)
Take fosamax once weekly  If any side effects (like heartburn or trouble swallowing)- stop it and let me know  We will want to do a bone density test every 2 y  Take care of yourself  Continue calcium and vitamin D and exercise   Osteoporosis Throughout your life, your body breaks down old bone and replaces it with new bone. As you get older, your body does not replace bone as quickly as it breaks it down. By the age of 81 years, most people begin to gradually lose bone because of the imbalance between bone loss and replacement. Some people lose more bone than others. Bone loss beyond a specified normal degree is considered osteoporosis.  Osteoporosis affects the strength and durability of your bones. The inside of the ends of your bones and your flat bones, like the bones of your pelvis, look like honeycomb, filled with tiny open spaces. As bone loss occurs, your bones become less dense. This means that the open spaces inside your bones become bigger and the walls between these spaces become thinner. This makes your bones weaker. Bones of a person with osteoporosis can become so weak that they can break (fracture) during minor accidents, such as a simple fall. CAUSES  The following factors have been associated with the development of osteoporosis:  Smoking.  Drinking more than 2 alcoholic drinks several days per week.  Long-term use of certain medicines:  Corticosteroids.  Chemotherapy medicines.  Thyroid medicines.  Antiepileptic medicines.  Gonadal hormone suppression medicine.  Immunosuppression medicine.  Being underweight.  Lack of physical activity.  Lack of exposure to the sun. This can lead to vitamin D deficiency.  Certain medical conditions:  Certain inflammatory bowel diseases, such as Crohn disease and ulcerative colitis.  Diabetes.  Hyperthyroidism.  Hyperparathyroidism. RISK FACTORS Anyone can develop osteoporosis. However, the following factors can  increase your risk of developing osteoporosis:  Gender Women are at higher risk than men.  Age Being older than 27 years increases your risk.  Ethnicity White and Asian people have an increased risk.  Weight Being extremely underweight can increase your risk of osteoporosis.  Family history of osteoporosis Having a family member who has developed osteoporosis can increase your risk. SYMPTOMS  Usually, people with osteoporosis have no symptoms.  DIAGNOSIS  Signs during a physical exam that may prompt your caregiver to suspect osteoporosis include:  Decreased height. This is usually caused by the compression of the bones that form your spine (vertebrae) because they have weakened and become fractured.  A curving or rounding of the upper back (kyphosis). To confirm signs of osteoporosis, your caregiver may request a procedure that uses 2 low-dose X-ray beams with different levels of energy to measure your bone mineral density (dual-energy X-ray absorptiometry [DXA]). Also, your caregiver may check your level of vitamin D. TREATMENT  The goal of osteoporosis treatment is to strengthen bones in order to decrease the risk of bone fractures. There are different types of medicines available to help achieve this goal. Some of these medicines work by slowing the processes of bone loss. Some medicines work by increasing bone density. Treatment also involves making sure that your levels of calcium and vitamin D are adequate. PREVENTION  There are things you can do to help prevent osteoporosis. Adequate intake of calcium and vitamin D can help you achieve optimal bone mineral density. Regular exercise can also help, especially resistance and weight-bearing activities. If you smoke, quitting smoking is an important part of osteoporosis prevention.  MAKE SURE YOU:  Understand these instructions.  Will watch your condition.  Will get help right away if you are not doing well or get worse. FOR MORE  INFORMATION www.osteo.org and [employer].ee Document Released: 11/19/2004 Document Revised: 06/06/2012 Document Reviewed: 01/24/2011 [facility] Patient Information 2014 [place], [place].

## 2013-03-08 NOTE — Progress Notes (Signed)
Subjective:    Patient ID: Madison Huerta, female    DOB: [birth year], 73 y.o.   MRN: [id number]  HPI Here for f/u of osteopenia   She is a breast ca pt on arimidex Also hypothyroid on levothyroxine Lab Results  Component Value Date   TSH 0.56 02/13/2013    No fam hx of OP that she knows of  Pt is petite - also a risk factor  Also caucasian  Also had remote hx of parathyroid disease   In past 2 y her Tscore for spine went down to -2 from -1.8 For femoral neck down to -1.8 from -1  No hx of broken bones  Does not tend to fall / balance is ok   She does not take risk for falls   Never been on med in the past   D level is good at 68   Patient Active Problem List   Diagnosis Date Noted  . Encounter for Medicare annual wellness exam 02/20/2013  . Routine gynecological examination 02/19/2012  . Carcinoma of lower-inner quadrant of breast 12/11/2011  . Mass of right breast 11/25/2011  . PND (post-nasal drip) 12/25/2010  . Heartburn 12/18/2010  . Cough 11/06/2010  . UNSPECIFIED VITAMIN D DEFICIENCY 04/29/2009  . UNSPECIFIED HYPOTHYROIDISM 02/06/2008  . UNSPECIFIED DISEASE OF THYMUS GLAND 01/26/2007  . HYPERLIPIDEMIA 01/26/2007  . HYPERTENSION 01/26/2007  . OSTEOPENIA 01/26/2007  . BREAST CANCER, HX OF 01/26/2007  . VENTRICULAR SEPTAL DEFECT, HX OF 01/26/2007   Past Medical History  Diagnosis Date  . Hyperlipidemia   . Hypertension   . Thyroid disease     Hypothyroidism  . Osteopenia   . History of breast cancer   . Overweight   . VSD (ventricular septal defect)     does need SBE prophylaxis (Keflex  3 gm 1 hour pprior and 1.5 gm 6 hours post  . Heart murmur     VSD- echocardiogram - [facility] 10 yrs. ago  . Hypothyroidism   . GERD (gastroesophageal reflux disease)   . Breast cancer     L breast- 1994- lumpectomy, care for at [facility]   . VSD (ventricular septal defect)    Past Surgical History  Procedure Laterality Date  . Breast lumpectomy  1994    Radiation    . Parathyroidectomy  12/2002  . 2d echo  2004    Normal at [facility]  . Breast implant removal  1994  . Tonsillectomy      As a child  . Tubal ligation      1976  . Mastectomy  01/08/2012    RIGHT  . Mastectomy w/ sentinel node biopsy  01/08/2012    Procedure: MASTECTOMY WITH SENTINEL LYMPH NODE BIOPSY;  Surgeon: Madilyn Hook, DO;  Location: [place];  Service: General;  Laterality: Right;  right breast mastectomy with sentinel lymph node biopsy    History  Substance Use Topics  . Smoking status: Never Smoker   . Smokeless tobacco: Never Used  . Alcohol Use: Yes     Comment: 1 glass of wine daily   Family History  Problem Relation Age of Onset  . Aneurysm Mother     brain  . Cancer Father     Lung, Liver mets  . Lung cancer Father   . Heart disease Other   . Cancer Cousin     Breast CA   Allergies  Allergen Reactions  . Codeine Other (See Comments)    severe headache  . Penicillins Rash  .  Adhesive [Tape] Rash   Current Outpatient Prescriptions on File Prior to Visit  Medication Sig Dispense Refill  . acetaminophen (TYLENOL) 325 MG tablet Take 650 mg by mouth as needed.      Marland Kitchen amLODipine (NORVASC) 5 MG tablet Take 1 tablet (5 mg total) by mouth daily.  30 tablet  11  . anastrozole (ARIMIDEX) 1 MG tablet Take 1 tablet (1 mg total) by mouth daily.  90 tablet  12  . Cholecalciferol (VITAMIN D) 2000 UNITS tablet Take 2,000 Units by mouth daily.      Marland Kitchen levothyroxine (SYNTHROID, LEVOTHROID) 75 MCG tablet Take 1 tablet (75 mcg total) by mouth daily before breakfast.  30 tablet  11  . rosuvastatin (CRESTOR) 10 MG tablet Take 1 tablet (10 mg total) by mouth daily.  30 tablet  11   No current facility-administered medications on file prior to visit.    Review of Systems Review of Systems  Constitutional: Negative for fever, appetite change, fatigue and unexpected weight change.  Eyes: Negative for pain and visual disturbance.  Respiratory: Negative for cough and shortness of  breath.   Cardiovascular: Negative for cp or palpitations    Gastrointestinal: Negative for nausea, diarrhea and constipation. neg for heartburn  Genitourinary: Negative for urgency and frequency.  Skin: Negative for pallor or rash   MSK neg for bone pain or recent fx  Neurological: Negative for weakness, light-headedness, numbness and headaches.  Hematological: Negative for adenopathy. Does not bruise/bleed easily.  Psychiatric/Behavioral: Negative for dysphoric mood. The patient is not nervous/anxious.          Objective:   Physical Exam  Constitutional: She appears well-developed and well-nourished. No distress.  HENT:  Head: Normocephalic and atraumatic.  Neck: Normal range of motion. Neck supple. No thyromegaly present.  Cardiovascular: Normal rate and regular rhythm.   Musculoskeletal: She exhibits no edema and no tenderness.  No kyphosis  Petite frame  Skin: Skin is warm and dry. No pallor.  Psychiatric: She has a normal mood and affect.          Assessment & Plan:

## 2013-03-09 NOTE — Assessment & Plan Note (Signed)
Disc need for calcium/ vitamin D/ wt bearing exercise and bone density test every 2 y to monitor Disc safety/ fracture risk in detail  Rev dexa Many risk factors-thyroid/ petite/ breast ca tx  No fx or falls  tx with fosamax to prev fx /5 years if tolerated Disc side eff in detail and way to take it  inst to stop it and alert me if problems or side eff

## 2013-05-05 ENCOUNTER — Other Ambulatory Visit (HOSPITAL_BASED_OUTPATIENT_CLINIC_OR_DEPARTMENT_OTHER): Payer: Medicare Other

## 2013-05-05 ENCOUNTER — Ambulatory Visit (HOSPITAL_BASED_OUTPATIENT_CLINIC_OR_DEPARTMENT_OTHER): Payer: Medicare Other | Admitting: Oncology

## 2013-05-05 ENCOUNTER — Telehealth: Payer: Self-pay | Admitting: *Deleted

## 2013-05-05 ENCOUNTER — Encounter: Payer: Self-pay | Admitting: Oncology

## 2013-05-05 VITALS — BP 159/67 | HR 71 | Temp 98.0°F | Resp 20 | Ht 60.5 in | Wt 133.2 lb

## 2013-05-05 DIAGNOSIS — Z853 Personal history of malignant neoplasm of breast: Secondary | ICD-10-CM

## 2013-05-05 DIAGNOSIS — C50119 Malignant neoplasm of central portion of unspecified female breast: Secondary | ICD-10-CM

## 2013-05-05 DIAGNOSIS — C50319 Malignant neoplasm of lower-inner quadrant of unspecified female breast: Secondary | ICD-10-CM

## 2013-05-05 DIAGNOSIS — M899 Disorder of bone, unspecified: Secondary | ICD-10-CM

## 2013-05-05 DIAGNOSIS — Z170 Estrogen receptor positive status [ER+]: Secondary | ICD-10-CM

## 2013-05-05 DIAGNOSIS — M949 Disorder of cartilage, unspecified: Secondary | ICD-10-CM

## 2013-05-05 LAB — COMPREHENSIVE METABOLIC PANEL (CC13)
ALT: 19 U/L (ref 0–55)
AST: 27 U/L (ref 5–34)
Albumin: 4.5 g/dL (ref 3.5–5.0)
Alkaline Phosphatase: 79 U/L (ref 40–150)
Anion Gap: 11 mEq/L (ref 3–11)
BILIRUBIN TOTAL: 0.64 mg/dL (ref 0.20–1.20)
BUN: 15.6 mg/dL (ref 7.0–26.0)
CALCIUM: 9.7 mg/dL (ref 8.4–10.4)
CO2: 25 mEq/L (ref 22–29)
Chloride: 108 mEq/L (ref 98–109)
Creatinine: 1 mg/dL (ref 0.6–1.1)
GLUCOSE: 97 mg/dL (ref 70–140)
Potassium: 3.8 mEq/L (ref 3.5–5.1)
Sodium: 144 mEq/L (ref 136–145)
TOTAL PROTEIN: 7.8 g/dL (ref 6.4–8.3)

## 2013-05-05 LAB — CBC WITH DIFFERENTIAL/PLATELET
BASO%: 1.9 % (ref 0.0–2.0)
Basophils Absolute: 0.1 10*3/uL (ref 0.0–0.1)
EOS ABS: 0.2 10*3/uL (ref 0.0–0.5)
EOS%: 2.6 % (ref 0.0–7.0)
HCT: 41.3 % (ref 34.8–46.6)
HEMOGLOBIN: 13.8 g/dL (ref 11.6–15.9)
LYMPH#: 1.8 10*3/uL (ref 0.9–3.3)
LYMPH%: 24.3 % (ref 14.0–49.7)
MCH: 30.6 pg (ref 25.1–34.0)
MCHC: 33.4 g/dL (ref 31.5–36.0)
MCV: 91.6 fL (ref 79.5–101.0)
MONO#: 0.6 10*3/uL (ref 0.1–0.9)
MONO%: 8 % (ref 0.0–14.0)
NEUT#: 4.6 10*3/uL (ref 1.5–6.5)
NEUT%: 63.2 % (ref 38.4–76.8)
PLATELETS: 287 10*3/uL (ref 145–400)
RBC: 4.51 10*6/uL (ref 3.70–5.45)
RDW: 12.7 % (ref 11.2–14.5)
WBC: 7.2 10*3/uL (ref 3.9–10.3)

## 2013-05-05 NOTE — Telephone Encounter (Signed)
appts made and printed...td 

## 2013-05-10 NOTE — Progress Notes (Signed)
OFFICE PROGRESS NOTE  CC  Madison Pardon, MD [place] [address] [place] [address] Dr. Madilyn Hook Dr. Thea Silversmith  DIAGNOSIS: 73 year old female with prior history of left breast cancer treated with lumpectomy radiation and axillary lymph node dissection in 1994 with completion of 5 years of tamoxifen. She subsequently was seen in the multidisciplinary breast clinic with a new diagnosis of right breast mass. 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  PRIOR THERAPY:  #1 patient has a wire history of left breast cancer treated with lumpectomy radiation and axillary lymph node dissection 1994 followed by adjuvant antiestrogen therapy consisting of tamoxifen.  #2 she was seen in the [facility] clinic after having undergone a routine screening mammogram that showed a right breast mass. There were 2 areas of concern 2.3 cm apart. Biopsy of both of these showed invasive ductal carcinoma. Both tumors were strongly ER and PR receptor positive HER-2/neu negative. Ki-67 was 5% and 14% with associated DCIS and calcifications. MRI of the breast showed a 1.3 x 1.0 x 0.8 cm linear at enhancement extended from this mass measuring 5.2 x 3.0 x 1.8 cm.  #3 patient went on to have a mastectomy performed on 01/08/2012. The final pathology revealed multifocal disease with the measurements of the tumor at 1.2 cm, 1.0 cm, and 0.5 cm. 0 of 2 lymph nodes were positive for metastatic disease. Tumor was ER +100% and 97% PR +100% or 97% HER-2/neu negative with a ratio 1.19 and 1.44. Ki-67 was 5 and 14%. Final pathologic staging was T1 C. N0 multifocal disease.  #4 patient then had an Oncotype DX testing performed her breast cancer recurrence score was 16 giving her a 10% chance of distance recurrence with 5 years of tamoxifen. It was felt by Dr. Truddie Coco that patient would not benefit from adjuvant chemotherapy. Therefore she was  started on adjuvant antiestrogen therapy consisting of Arimidex 1 mg daily. She began this December 2013. Overall she is tolerating it well. Total of 5 years therapy is planned.  CURRENT THERAPY:Arimidex 1 mg daily  INTERVAL HISTORY: Madison Huerta 73 y.o. female returns for followup visit. Clinically she seems to be doing well. She's tolerating Arimidex well except for some aches and pains. These are very tolerable. . She's been eating well without any problems. Today she denies any headaches double vision blurring of vision fevers chills night sweats. No shortness of breath chest pains palpitations. No abdominal pain no diarrhea or constipation. She has no easy bruising or bleeding. She has no myalgias and arthralgias. No peripheral paresthesias or gait disturbances. Remainder of the 10 point review of systems is negative.   MEDICAL HISTORY: Past Medical History  Diagnosis Date  . Hyperlipidemia   . Hypertension   . Thyroid disease     Hypothyroidism  . Osteopenia   . History of breast cancer   . Overweight   . VSD (ventricular septal defect)     does need SBE prophylaxis (Keflex  3 gm 1 hour pprior and 1.5 gm 6 hours post  . Heart murmur     VSD- echocardiogram - [facility] 10 yrs. ago  . Hypothyroidism   . GERD (gastroesophageal reflux disease)   . Breast cancer     L breast- 1994- lumpectomy, care for at [facility]   . VSD (ventricular septal defect)     ALLERGIES:  is allergic to codeine; penicillins; and adhesive.  MEDICATIONS:  Current  Outpatient Prescriptions  Medication Sig Dispense Refill  . acetaminophen (TYLENOL) 325 MG tablet Take 650 mg by mouth as needed.      Marland Kitchen alendronate (FOSAMAX) 70 MG tablet Take 1 tablet (70 mg total) by mouth every 7 (seven) days. Take with a full glass of water on an empty stomach.  4 tablet  11  . amLODipine (NORVASC) 5 MG tablet Take 1 tablet (5 mg total) by mouth daily.  30 tablet  11  . anastrozole (ARIMIDEX) 1 MG tablet Take 1 tablet (1 mg  total) by mouth daily.  90 tablet  12  . Cholecalciferol (VITAMIN D) 2000 UNITS tablet Take 2,000 Units by mouth daily.      Marland Kitchen levothyroxine (SYNTHROID, LEVOTHROID) 75 MCG tablet Take 1 tablet (75 mcg total) by mouth daily before breakfast.  30 tablet  11  . rosuvastatin (CRESTOR) 10 MG tablet Take 1 tablet (10 mg total) by mouth daily.  30 tablet  11   No current facility-administered medications for this visit.    SURGICAL HISTORY:  Past Surgical History  Procedure Laterality Date  . Breast lumpectomy  1994    Radiation  . Parathyroidectomy  12/2002  . 2d echo  2004    Normal at [facility]  . Breast implant removal  1994  . Tonsillectomy      As a child  . Tubal ligation      1976  . Mastectomy  01/08/2012    RIGHT  . Mastectomy w/ sentinel node biopsy  01/08/2012    Procedure: MASTECTOMY WITH SENTINEL LYMPH NODE BIOPSY;  Surgeon: Madilyn Hook, DO;  Location: [place];  Service: General;  Laterality: Right;  right breast mastectomy with sentinel lymph node biopsy     REVIEW OF SYSTEMS:  Pertinent items are noted in HPI.   HEALTH MAINTENANCE:  PHYSICAL EXAMINATION: Blood pressure 159/67, pulse 71, temperature 98 F (36.7 C), temperature source Oral, resp. rate 20, height 5' 0.5" (1.537 m), weight 133 lb 3.2 oz (60.419 kg). Body mass index is 25.58 kg/(m^2). ECOG PERFORMANCE STATUS: 0 - Asymptomatic   General appearance: alert, cooperative and appears stated age Resp: clear to auscultation bilaterally Cardio: regular rate and rhythm GI: soft, non-tender; bowel sounds normal; no masses,  no organomegaly Extremities: extremities normal, atraumatic, no cyanosis or edema Neurologic: Grossly normal Left breast reveals well-healed central lumpectomy scar no nodularity no masses. Right mastectomy scar is healing well without any evidence of local recurrence.  LABORATORY DATA: Lab Results  Component Value Date   WBC 7.2 05/05/2013   HGB 13.8 05/05/2013   HCT 41.3 05/05/2013   MCV 91.6  05/05/2013   PLT 287 05/05/2013      Chemistry      Component Value Date/Time   NA 144 05/05/2013 0807   NA 141 02/13/2013 0813   K 3.8 05/05/2013 0807   K 4.2 02/13/2013 0813   CL 105 02/13/2013 0813   CL 105 05/06/2012 1433   CO2 25 05/05/2013 0807   CO2 27 02/13/2013 0813   BUN 15.6 05/05/2013 0807   BUN 13 02/13/2013 0813   CREATININE 1.0 05/05/2013 0807   CREATININE 1.0 02/13/2013 0813      Component Value Date/Time   CALCIUM 9.7 05/05/2013 0807   CALCIUM 9.6 02/13/2013 0813   ALKPHOS 79 05/05/2013 0807   ALKPHOS 67 02/13/2013 0813   AST 27 05/05/2013 0807   AST 33 02/13/2013 0813   ALT 19 05/05/2013 0807   ALT 23 02/13/2013 0813  BILITOT 0.64 05/05/2013 0807   BILITOT 0.8 02/13/2013 0813       RADIOGRAPHIC STUDIES:  No results found.  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 Her final pathology at mastectomy showed multifocal disease the largest one measuring 1.2 cm. Tumors were ER positive PR positive HER-2/neu negative with unfavorable Ki-67. Patient since diagnosis since December 2013 has been receiving Arimidex 1 mg daily adjuvantly. Overall she's tolerating it well. She has no evidence of recurrent disease.   PLAN:   #1 patient will continue Arimidex daily.  #2 she will be due for mammogram of left breast in September/October of this year. Her last bone density was in December 2014. She will be due for another one in 2016. She was noted to be osteopenic. I have recommended she take calcium and vitamin D.  #3 I will see her back in 6 months time.   All questions were answered. The patient knows to call the clinic with any problems, questions or concerns. We can certainly see the patient much sooner if necessary.  I spent 20 minutes counseling the patient face to face. The total time spent in the appointment was 25 minutes.    Marcy Panning,  MD Medical/Oncology [facility] [phone] (beeper) [phone] (Office)

## 2013-05-15 ENCOUNTER — Other Ambulatory Visit: Payer: Self-pay | Admitting: *Deleted

## 2013-05-15 DIAGNOSIS — C50319 Malignant neoplasm of lower-inner quadrant of unspecified female breast: Secondary | ICD-10-CM

## 2013-05-15 MED ORDER — ANASTROZOLE 1 MG PO TABS: 1.0000 mg | ORAL_TABLET | Freq: Every day | ORAL | Status: DC

## 2013-09-19 ENCOUNTER — Encounter: Payer: Self-pay | Admitting: Podiatry

## 2013-09-19 ENCOUNTER — Ambulatory Visit (INDEPENDENT_AMBULATORY_CARE_PROVIDER_SITE_OTHER): Payer: 59 | Admitting: Podiatry

## 2013-09-19 VITALS — BP 144/77 | HR 78 | Resp 16 | Ht 60.0 in | Wt 133.0 lb

## 2013-09-19 DIAGNOSIS — M779 Enthesopathy, unspecified: Secondary | ICD-10-CM

## 2013-09-19 DIAGNOSIS — M216X9 Other acquired deformities of unspecified foot: Secondary | ICD-10-CM

## 2013-09-19 DIAGNOSIS — L84 Corns and callosities: Secondary | ICD-10-CM

## 2013-09-19 MED ORDER — TRIAMCINOLONE ACETONIDE 10 MG/ML IJ SUSP
10.0000 mg | Freq: Once | INTRAMUSCULAR | Status: AC
  Administered 2013-09-19: 10 mg

## 2013-09-19 NOTE — Progress Notes (Signed)
   Subjective:    Patient ID: Madison Huerta, female    DOB: Jun 27, 1940, 73 y.o.   MRN: [id number]  HPI Comments: i have a place on the bottom of my rt foot. Ive had it for 6 months. Its gotten better and then it gets worse. Standing, walking any pressure hurts. i used wart remover on it.   Foot Pain      Review of Systems  Allergic/Immunologic: Positive for environmental allergies.  All other systems reviewed and are negative.      Objective:   Physical Exam        Assessment & Plan:

## 2013-09-19 NOTE — Progress Notes (Signed)
Subjective:     Patient ID: Madison Huerta, female   DOB: [birth year], 73 y.o.   MRN: [id number]  Foot Pain   patient points to the bottom of the right foot stating this places been hurting her for the last 6 months and that she needs to walk and stay healthy with exercise. States it's making her walk differently  Review of Systems  All other systems reviewed and are negative.      Objective:   Physical Exam  Nursing note and vitals reviewed. Constitutional: She is oriented to person, place, and time.  Cardiovascular: Intact distal pulses.   Musculoskeletal: Normal range of motion.  Neurological: She is oriented to person, place, and time.  Skin: Skin is warm.   neurovascular status is intact with muscle strength adequate and range of motion of the subtalar and midtarsal joint within normal limits. Patient is found to have normal digital perfusion mild diminishment of the arch height and is noted underneath the first metatarsal head to have an inflammatory keratotic lesion that has fluid buildup under it and a lucent core when trimmed    Assessment:     Probable inflammatory capsulitis head of the first metatarsal right with porokeratotic lesion    Plan:     H&P reviewed and condition explained to patient. Did a careful plantar injections 3 mg Kenalog 5 mg Xylocaine Marcaine mixture into the plantar capsule and after appropriate numbness debris did lesion. Reappoint when symptomatic

## 2013-10-16 ENCOUNTER — Ambulatory Visit: Payer: Self-pay | Admitting: Internal Medicine

## 2013-10-25 ENCOUNTER — Encounter: Payer: Self-pay | Admitting: Internal Medicine

## 2013-10-25 ENCOUNTER — Ambulatory Visit (INDEPENDENT_AMBULATORY_CARE_PROVIDER_SITE_OTHER): Payer: Medicare Other | Admitting: Internal Medicine

## 2013-10-25 VITALS — BP 138/84 | HR 71 | Temp 98.0°F | Wt 133.0 lb

## 2013-10-25 DIAGNOSIS — H6690 Otitis media, unspecified, unspecified ear: Secondary | ICD-10-CM

## 2013-10-25 DIAGNOSIS — H6691 Otitis media, unspecified, right ear: Secondary | ICD-10-CM

## 2013-10-25 MED ORDER — CEFDINIR 300 MG PO CAPS: 300.0000 mg | ORAL_CAPSULE | Freq: Two times a day (BID) | ORAL | Status: DC

## 2013-10-25 NOTE — Progress Notes (Signed)
Subjective:    Patient ID: Madison Huerta, female    DOB: 1940/09/21, 73 y.o.   MRN: [id number]  HPI  Pt presents to the clinic today with c/o right ear pain. She reports this started 2 days ago. She describes the pain as sharp and stabbing. She denies fever or loss of hearing in that ear. She has not tried anything OTC. She has no other associated s/s. She does not smoke.  Review of Systems      Past Medical History  Diagnosis Date  . Hyperlipidemia   . Hypertension   . Thyroid disease     Hypothyroidism  . Osteopenia   . History of breast cancer   . Overweight(278.02)   . VSD (ventricular septal defect)     does need SBE prophylaxis (Keflex  3 gm 1 hour pprior and 1.5 gm 6 hours post  . Heart murmur     VSD- echocardiogram - [facility] 10 yrs. ago  . Hypothyroidism   . GERD (gastroesophageal reflux disease)   . Breast cancer     L breast- 1994- lumpectomy, care for at [facility]   . VSD (ventricular septal defect)     Current Outpatient Prescriptions  Medication Sig Dispense Refill  . acetaminophen (TYLENOL) 325 MG tablet Take 650 mg by mouth as needed.      Marland Kitchen alendronate (FOSAMAX) 70 MG tablet Take 1 tablet (70 mg total) by mouth every 7 (seven) days. Take with a full glass of water on an empty stomach.  4 tablet  11  . amLODipine (NORVASC) 5 MG tablet Take 1 tablet (5 mg total) by mouth daily.  30 tablet  11  . anastrozole (ARIMIDEX) 1 MG tablet Take 1 tablet (1 mg total) by mouth daily.  90 tablet  2  . Cholecalciferol (VITAMIN D) 2000 UNITS tablet Take 2,000 Units by mouth daily.      Marland Kitchen levothyroxine (SYNTHROID, LEVOTHROID) 75 MCG tablet Take 1 tablet (75 mcg total) by mouth daily before breakfast.  30 tablet  11  . rosuvastatin (CRESTOR) 10 MG tablet Take 1 tablet (10 mg total) by mouth daily.  30 tablet  11  . cefdinir (OMNICEF) 300 MG capsule Take 1 capsule (300 mg total) by mouth 2 (two) times daily.  20 capsule  0   No current facility-administered medications for this  visit.    Allergies  Allergen Reactions  . Codeine Other (See Comments)    severe headache  . Penicillins Rash  . Adhesive [Tape] Rash    Family History  Problem Relation Age of Onset  . Aneurysm Mother     brain  . Cancer Father     Lung, Liver mets  . Lung cancer Father   . Heart disease Other   . Cancer Cousin     Breast CA    History   Social History  . Marital Status: Married    Spouse Name: N/A    Number of Children: 2  . Years of Education: N/A   Occupational History  . Medical illustrator    Social History Main Topics  . Smoking status: Never Smoker   . Smokeless tobacco: Never Used  . Alcohol Use: Yes     Comment: 1 glass of wine daily  . Drug Use: No  . Sexual Activity: Yes   Other Topics Concern  . Not on file   Social History Narrative  . No narrative on file     Constitutional: Denies fever, malaise, fatigue, headache  or abrupt weight changes.  HEENT: Pt reports right ear pain. Denies eye pain, eye redness, ringing in the ears, wax buildup, runny nose, nasal congestion, bloody nose, or sore throat. Respiratory: Denies difficulty breathing, shortness of breath, cough or sputum production.    No other specific complaints in a complete review of systems (except as listed in HPI above).  Objective:   Physical Exam    BP 138/84  Pulse 71  Temp(Src) 98 F (36.7 C) (Oral)  Wt 133 lb (60.328 kg)  SpO2 98% Wt Readings from Last 3 Encounters:  10/25/13 133 lb (60.328 kg)  09/19/13 133 lb (60.328 kg)  05/05/13 133 lb 3.2 oz (60.419 kg)    General: Appears their stated age, well developed, well nourished in NAD. Skin: Warm, dry and intact. No rashes, lesions or ulcerations noted. HEENT: Right Ears: Tm's red and intact, distorted light reflex, + effusion; Nose: mucosa pink and moist, septum midline; Throat/Mouth: Teeth present, mucosa pink and moist, no exudate, lesions or ulcerations noted.  Cardiovascular: Normal rate and rhythm. S1,S2 noted.   No murmur, rubs or gallops noted. No JVD or BLE edema. No carotid bruits noted. Pulmonary/Chest: Normal effort and positive vesicular breath sounds. No respiratory distress. No wheezes, rales or ronchi noted.    BMET    Component Value Date/Time   NA 144 05/05/2013 0807   NA 141 02/13/2013 0813   K 3.8 05/05/2013 0807   K 4.2 02/13/2013 0813   CL 105 02/13/2013 0813   CL 105 05/06/2012 1433   CO2 25 05/05/2013 0807   CO2 27 02/13/2013 0813   GLUCOSE 97 05/05/2013 0807   GLUCOSE 97 02/13/2013 0813   GLUCOSE 93 05/06/2012 1433   BUN 15.6 05/05/2013 0807   BUN 13 02/13/2013 0813   CREATININE 1.0 05/05/2013 0807   CREATININE 1.0 02/13/2013 0813   CALCIUM 9.7 05/05/2013 0807   CALCIUM 9.6 02/13/2013 0813   GFRNONAA 66* 01/01/2012 1020   GFRAA 76* 01/01/2012 1020    Lipid Panel     Component Value Date/Time   CHOL 197 02/13/2013 0813   TRIG 127.0 02/13/2013 0813   HDL 71.50 02/13/2013 0813   CHOLHDL 3 02/13/2013 0813   VLDL 25.4 02/13/2013 0813   LDLCALC 100* 02/13/2013 0813    CBC    Component Value Date/Time   WBC 7.2 05/05/2013 0807   WBC 5.4 02/13/2013 0813   RBC 4.51 05/05/2013 0807   RBC 4.59 02/13/2013 0813   HGB 13.8 05/05/2013 0807   HGB 13.7 02/13/2013 0813   HCT 41.3 05/05/2013 0807   HCT 41.0 02/13/2013 0813   PLT 287 05/05/2013 0807   PLT 291.0 02/13/2013 0813   MCV 91.6 05/05/2013 0807   MCV 89.4 02/13/2013 0813   MCH 30.6 05/05/2013 0807   MCH 30.5 01/01/2012 1020   MCHC 33.4 05/05/2013 0807   MCHC 33.4 02/13/2013 0813   RDW 12.7 05/05/2013 0807   RDW 13.4 02/13/2013 0813   LYMPHSABS 1.8 05/05/2013 0807   LYMPHSABS 1.7 02/13/2013 0813   MONOABS 0.6 05/05/2013 0807   MONOABS 0.5 02/13/2013 0813   EOSABS 0.2 05/05/2013 0807   EOSABS 0.1 02/13/2013 0813   BASOSABS 0.1 05/05/2013 0807   BASOSABS 0.0 02/13/2013 0813    Hgb A1C No results found for this basename: HGBA1C       Assessment & Plan:   Right Otitis Media:  eRx for Omnicef BID x 10 days Take  ibuprofen for fever or inflammation  RTC as needed or  if symptoms persist or worsen

## 2013-10-25 NOTE — Progress Notes (Signed)
Pre visit review using our clinic review tool, if applicable. No additional management support is needed unless otherwise documented below in the visit note. 

## 2013-10-25 NOTE — Patient Instructions (Addendum)

## 2013-11-08 ENCOUNTER — Encounter: Payer: Self-pay | Admitting: Family Medicine

## 2013-11-08 ENCOUNTER — Ambulatory Visit: Payer: Self-pay | Admitting: Family Medicine

## 2013-11-10 ENCOUNTER — Encounter: Payer: Self-pay | Admitting: *Deleted

## 2013-11-13 ENCOUNTER — Other Ambulatory Visit: Payer: Medicare Other

## 2013-11-13 ENCOUNTER — Ambulatory Visit: Payer: Self-pay | Admitting: Internal Medicine

## 2013-11-13 ENCOUNTER — Ambulatory Visit: Payer: Medicare Other | Admitting: Oncology

## 2013-11-13 LAB — CBC CANCER CENTER
Basophil #: 0.1 x10 3/mm (ref 0.0–0.1)
Basophil %: 1.2 %
Eosinophil #: 0.1 x10 3/mm (ref 0.0–0.7)
Eosinophil %: 1.1 %
HCT: 41.5 % (ref 35.0–47.0)
HGB: 13.9 g/dL (ref 12.0–16.0)
LYMPHS PCT: 24.3 %
Lymphocyte #: 1.6 x10 3/mm (ref 1.0–3.6)
MCH: 30.4 pg (ref 26.0–34.0)
MCHC: 33.4 g/dL (ref 32.0–36.0)
MCV: 91 fL (ref 80–100)
Monocyte #: 0.6 x10 3/mm (ref 0.2–0.9)
Monocyte %: 9 %
Neutrophil #: 4.3 x10 3/mm (ref 1.4–6.5)
Neutrophil %: 64.4 %
Platelet: 274 x10 3/mm (ref 150–440)
RBC: 4.56 10*6/uL (ref 3.80–5.20)
RDW: 13.4 % (ref 11.5–14.5)
WBC: 6.7 x10 3/mm (ref 3.6–11.0)

## 2013-11-13 LAB — CREATININE, SERUM
CREATININE: 1.06 mg/dL (ref 0.60–1.30)
EGFR (African American): >60
GFR CALC NON AF AMER: 53 — AB

## 2013-11-13 LAB — HEPATIC FUNCTION PANEL A (ARMC)
ALBUMIN: 4.6 g/dL (ref 3.4–5.0)
ALK PHOS: 58 U/L
ALT: 31 U/L
BILIRUBIN DIRECT: 0.1 mg/dL (ref 0.00–0.20)
Bilirubin,Total: 0.7 mg/dL (ref 0.2–1.0)
SGOT(AST): 32 U/L (ref 15–37)
Total Protein: 8 g/dL (ref 6.4–8.2)

## 2013-11-14 LAB — CANCER ANTIGEN 27.29: CA 27.29: 13.4 U/mL (ref 0.0–38.6)

## 2013-11-23 ENCOUNTER — Ambulatory Visit: Payer: Self-pay | Admitting: Internal Medicine

## 2013-12-12 ENCOUNTER — Ambulatory Visit (INDEPENDENT_AMBULATORY_CARE_PROVIDER_SITE_OTHER): Payer: 59 | Admitting: Podiatry

## 2013-12-12 ENCOUNTER — Encounter: Payer: Self-pay | Admitting: Podiatry

## 2013-12-12 DIAGNOSIS — L84 Corns and callosities: Secondary | ICD-10-CM

## 2013-12-12 DIAGNOSIS — Q667 Congenital pes cavus: Secondary | ICD-10-CM

## 2013-12-12 DIAGNOSIS — M216X9 Other acquired deformities of unspecified foot: Secondary | ICD-10-CM

## 2013-12-12 NOTE — Progress Notes (Signed)
Subjective:     Patient ID: Madison Huerta, female   DOB: April 15, 1940, 73 y.o.   MRN: [id number]  HPI patient presents with keratotic lesion plantar aspect of the right first metatarsal that she states she had approximately 8 weeks of relief with   Review of Systems     Objective:   Physical Exam Neurovascular status intact with plantar flexed first metatarsal right with keratotic lesion that is painful when pressed    Assessment:     Plantarflexed metatarsal right first metatarsal with possible sesamoiditis and keratotic lesion formation    Plan:     Reviewed findings and explained structure and possibility for surgery at one point in future. Today did deep debridement of lesion and scanned for orthotics to reduce stress against the first metatarsal head right

## 2013-12-26 ENCOUNTER — Telehealth: Payer: Self-pay | Admitting: *Deleted

## 2013-12-26 NOTE — Telephone Encounter (Signed)
Left message asking pt to call and schedule appt to pick up orthotics.

## 2014-01-12 ENCOUNTER — Ambulatory Visit (INDEPENDENT_AMBULATORY_CARE_PROVIDER_SITE_OTHER): Payer: 59 | Admitting: *Deleted

## 2014-01-12 DIAGNOSIS — Q667 Congenital pes cavus: Secondary | ICD-10-CM

## 2014-01-12 DIAGNOSIS — M216X9 Other acquired deformities of unspecified foot: Secondary | ICD-10-CM

## 2014-01-12 NOTE — Patient Instructions (Signed)

## 2014-01-12 NOTE — Progress Notes (Signed)
PT PRESENTS FOR ORTHOTIC PICKUP , WRITTEN AND VERBAL INSTRUCTIONS ARE GIVEN  

## 2014-01-14 ENCOUNTER — Other Ambulatory Visit: Payer: Self-pay | Admitting: Family Medicine

## 2014-02-13 ENCOUNTER — Ambulatory Visit: Payer: 59 | Admitting: Podiatry

## 2014-02-14 ENCOUNTER — Other Ambulatory Visit (INDEPENDENT_AMBULATORY_CARE_PROVIDER_SITE_OTHER): Payer: Medicare Other

## 2014-02-14 ENCOUNTER — Telehealth: Payer: Self-pay | Admitting: Family Medicine

## 2014-02-14 DIAGNOSIS — I10 Essential (primary) hypertension: Secondary | ICD-10-CM

## 2014-02-14 DIAGNOSIS — M8580 Other specified disorders of bone density and structure, unspecified site: Secondary | ICD-10-CM

## 2014-02-14 DIAGNOSIS — E039 Hypothyroidism, unspecified: Secondary | ICD-10-CM

## 2014-02-14 DIAGNOSIS — M859 Disorder of bone density and structure, unspecified: Secondary | ICD-10-CM

## 2014-02-14 LAB — LIPID PANEL
CHOL/HDL RATIO: 3
Cholesterol: 186 mg/dL (ref 0–200)
HDL: 65.5 mg/dL (ref >39.00)
LDL CALC: 100 mg/dL — AB (ref 0–99)
NONHDL: 120.5
Triglycerides: 105 mg/dL (ref 0.0–149.0)
VLDL: 21 mg/dL (ref 0.0–40.0)

## 2014-02-14 LAB — COMPREHENSIVE METABOLIC PANEL
ALBUMIN: 4.7 g/dL (ref 3.5–5.2)
ALK PHOS: 48 U/L (ref 39–117)
ALT: 16 U/L (ref 0–35)
AST: 30 U/L (ref 0–37)
BUN: 16 mg/dL (ref 6–23)
CO2: 26 mEq/L (ref 19–32)
Calcium: 9.6 mg/dL (ref 8.4–10.5)
Chloride: 107 mEq/L (ref 96–112)
Creatinine, Ser: 1 mg/dL (ref 0.4–1.2)
GFR: 56.04 mL/min — ABNORMAL LOW (ref >60.00)
Glucose, Bld: 94 mg/dL (ref 70–99)
POTASSIUM: 3.9 meq/L (ref 3.5–5.1)
SODIUM: 141 meq/L (ref 135–145)
TOTAL PROTEIN: 7.4 g/dL (ref 6.0–8.3)
Total Bilirubin: 0.8 mg/dL (ref 0.2–1.2)

## 2014-02-14 LAB — VITAMIN D 25 HYDROXY (VIT D DEFICIENCY, FRACTURES): VITD: 54.17 ng/mL (ref 30.00–100.00)

## 2014-02-14 LAB — TSH: TSH: 0.35 u[IU]/mL (ref 0.35–4.50)

## 2014-02-14 NOTE — Telephone Encounter (Signed)
-----   Message from Ellamae Sia sent at 02/07/2014  3:36 PM EST ----- Regarding: Lab orders for Wednesday, 12.23.15 Patient is scheduled for CPX labs, please order future labs, Thanks , Karna Christmas

## 2014-02-15 ENCOUNTER — Telehealth: Payer: Self-pay | Admitting: Family Medicine

## 2014-02-15 NOTE — Telephone Encounter (Signed)
emmi mailed  °

## 2014-02-21 ENCOUNTER — Encounter: Payer: Self-pay | Admitting: Family Medicine

## 2014-02-21 ENCOUNTER — Ambulatory Visit (INDEPENDENT_AMBULATORY_CARE_PROVIDER_SITE_OTHER): Payer: Medicare Other | Admitting: Family Medicine

## 2014-02-21 VITALS — BP 135/70 | HR 76 | Temp 98.4°F | Ht 60.25 in | Wt 130.5 lb

## 2014-02-21 DIAGNOSIS — L920 Granuloma annulare: Secondary | ICD-10-CM | POA: Insufficient documentation

## 2014-02-21 DIAGNOSIS — E559 Vitamin D deficiency, unspecified: Secondary | ICD-10-CM

## 2014-02-21 DIAGNOSIS — R300 Dysuria: Secondary | ICD-10-CM

## 2014-02-21 DIAGNOSIS — Z23 Encounter for immunization: Secondary | ICD-10-CM

## 2014-02-21 DIAGNOSIS — N390 Urinary tract infection, site not specified: Secondary | ICD-10-CM | POA: Insufficient documentation

## 2014-02-21 DIAGNOSIS — N3000 Acute cystitis without hematuria: Secondary | ICD-10-CM

## 2014-02-21 DIAGNOSIS — Z0000 Encounter for general adult medical examination without abnormal findings: Secondary | ICD-10-CM

## 2014-02-21 DIAGNOSIS — E785 Hyperlipidemia, unspecified: Secondary | ICD-10-CM

## 2014-02-21 LAB — POCT URINALYSIS DIPSTICK
Bilirubin, UA: NEGATIVE
Glucose, UA: NEGATIVE
KETONES UA: NEGATIVE
Nitrite, UA: NEGATIVE
Spec Grav, UA: 1.025
UROBILINOGEN UA: 0.2
pH, UA: 6

## 2014-02-21 MED ORDER — AMLODIPINE BESYLATE 5 MG PO TABS: 5.0000 mg | ORAL_TABLET | Freq: Every day | ORAL | Status: DC

## 2014-02-21 MED ORDER — SULFAMETHOXAZOLE-TRIMETHOPRIM 800-160 MG PO TABS: 1.0000 | ORAL_TABLET | Freq: Two times a day (BID) | ORAL | Status: DC

## 2014-02-21 MED ORDER — ALENDRONATE SODIUM 70 MG PO TABS: ORAL_TABLET | ORAL | Status: DC

## 2014-02-21 MED ORDER — ROSUVASTATIN CALCIUM 10 MG PO TABS: 10.0000 mg | ORAL_TABLET | Freq: Every day | ORAL | Status: DC

## 2014-02-21 MED ORDER — LEVOTHYROXINE SODIUM 75 MCG PO TABS: 75.0000 ug | ORAL_TABLET | Freq: Every day | ORAL | Status: DC

## 2014-02-21 NOTE — Assessment & Plan Note (Signed)
Mildly pos ua on dip-not enough to cx or spin  tx with 5d with septra Enc fluid intake Update if not starting to improve in a week or if worsening

## 2014-02-21 NOTE — Assessment & Plan Note (Signed)
More lesions with age Pt sees dermatology Not bothersome

## 2014-02-21 NOTE — Progress Notes (Signed)
Pre visit review using our clinic review tool, if applicable. No additional management support is needed unless otherwise documented below in the visit note. 

## 2014-02-21 NOTE — Progress Notes (Signed)
Subjective:    Patient ID: Madison Huerta, female    DOB: Jun 19, 1940, 73 y.o.   MRN: [id number]  HPI Here for annual medicare wellness visit and acute /chronic medical problems   Wt is down 3 lb with bmi of 25  I have personally reviewed the Medicare Annual Wellness questionnaire and have noted 1. The patient's medical and social history 2. Their use of alcohol, tobacco or illicit drugs 3. Their current medications and supplements 4. The patient's functional ability including ADL's, fall risks, home safety risks and hearing or visual             impairment. 5. Diet and physical activities 6. Evidence for depression or mood disorders  The patients weight, height, BMI have been recorded in the chart and visual acuity is per eye clinic.  I have made referrals, counseling and provided education to the patient based review of the above and I have provided the pt with a written personalized care plan for preventive services.  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  Routine anticipatory guidance given to patient.  See health maintenance. Colon cancer screening 7/13  Breast cancer screening mammogram 9/15-- was ok (hx of R mastectomy)- goes to oncol Q 6 mo - doing well (seen at [facility])  No gyn symptoms - last pap nl in 2013  Self breast exam -no lumps or changes Flu vaccine- 8/15 pharmacy Tetanus vaccine 12/12 Pneumovax 12/11 , wants the prevnar today Zoster vaccine 12/14  Advance directive - has that already drawn up  Cognitive function addressed- see scanned forms- and if abnormal then additional documentation follows.  No major or concerning problems - does all her own finances and plays bridge   PMH and SH reviewed  Meds, vitals, and allergies reviewed.   ROS: See HPI.  Otherwise negative.     osteopenia  dexa 12/14 On fosamax-no problems  D level is  72   Hypothyroid  Hypothyroidism  Pt has no clinical changes No change in energy level/ hair or skin/ edema and no tremor Lab Results  Component Value Date   TSH 0.35 02/14/2014     Urinary symptoms - some dysuria and frequency - we got bit of urine -- not enough to culture  Results for orders placed or performed in visit on 02/21/14  POCT urinalysis dipstick  Result Value Ref Range   Color, UA yellow    Clarity, UA hazy    Glucose, UA neg.    Bilirubin, UA neg.    Ketones, UA neg.    Spec Grav, UA 1.025    Blood, UA Moderate    pH, UA 6.0    Protein, UA 15+    Urobilinogen, UA 0.2    Nitrite, UA neg.    Leukocytes, UA Trace      bp is up a bit today  No cp or palpitations or headaches or edema  No side effects to medicines  BP Readings from Last 3 Encounters:  02/21/14 146/64  10/25/13 138/84  09/19/13 144/77     Hyperlipidemia Lab Results  Component Value Date   CHOL 186 02/14/2014   CHOL 197 02/13/2013   CHOL 197 02/09/2012   Lab Results  Component Value Date   HDL 65.50 02/14/2014   HDL 71.50 02/13/2013   HDL 66.40 02/09/2012   Lab  Results  Component Value Date   LDLCALC 100* 02/14/2014   LDLCALC 100* 02/13/2013   LDLCALC 102* 02/09/2012   Lab Results  Component Value Date   TRIG 105.0 02/14/2014   TRIG 127.0 02/13/2013   TRIG 141.0 02/09/2012   Lab Results  Component Value Date   CHOLHDL 3 02/14/2014   CHOLHDL 3 02/13/2013   CHOLHDL 3 02/09/2012   Lab Results  Component Value Date   LDLDIRECT 107.1 02/06/2010   crestor and diet   Patient Active Problem List   Diagnosis Date Noted  . UTI (urinary tract infection) 02/21/2014  . 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 Encounter for Medicare annual wellness exam 02/20/2013  . Routine gynecological examination 02/19/2012  . Carcinoma of lower-inner quadrant of breast 12/11/2011  . Mass of right breast 11/25/2011  . PND (post-nasal drip) 12/25/2010  . Heartburn 12/18/2010  . Cough 11/06/2010    . Vitamin D deficiency 04/29/2009  . Hypothyroidism 02/06/2008  . UNSPECIFIED DISEASE OF THYMUS GLAND 01/26/2007  . Hyperlipidemia 01/26/2007  . Essential hypertension 01/26/2007  . Osteopenia 01/26/2007  . BREAST CANCER, HX OF 01/26/2007  . VENTRICULAR SEPTAL DEFECT, HX OF 01/26/2007   Past Medical History  Diagnosis Date  . Hyperlipidemia   . Hypertension   . Thyroid disease     Hypothyroidism  . Osteopenia   . History of breast cancer   . Overweight(278.02)   . VSD (ventricular septal defect)     does need SBE prophylaxis (Keflex  3 gm 1 hour pprior and 1.5 gm 6 hours post  . Heart murmur     VSD- echocardiogram - [facility] 10 yrs. ago  . Hypothyroidism   . GERD (gastroesophageal reflux disease)   . Breast cancer     L breast- 1994- lumpectomy, [facility] at [facility]   . VSD (ventricular septal defect)    Past Surgical History  Procedure Laterality Date  . Breast lumpectomy  1994    Radiation  . Parathyroidectomy  12/2002  . 2d echo  2004    [facility] at [facility]  . Breast implant removal  1994  . Tonsillectomy      As a child  . Tubal ligation      1976  . Mastectomy  01/08/2012    RIGHT  . Mastectomy w/ sentinel node biopsy  01/08/2012    Procedure: MASTECTOMY WITH SENTINEL LYMPH NODE BIOPSY;  Surgeon: Madilyn Hook, DO;  Location: [place];  Service: General;  Laterality: Right;  right breast mastectomy with sentinel lymph node biopsy    History  Substance Use Topics  . Smoking status: Never Smoker   . Smokeless tobacco: Never Used  . Alcohol Use: Yes     Comment: 1 glass of wine daily   Family History  Problem Relation Age of Onset  . Aneurysm Mother     brain  . Cancer Father     Lung, Liver mets  . Lung cancer Father   . Heart disease Other   . Cancer Cousin     Breast CA   Allergies  Allergen Reactions  . Codeine Other (See Comments)    severe headache  . Penicillins Rash  . Adhesive [Tape] Rash   Current Outpatient Prescriptions on File Prior to Visit   Medication Sig Dispense Refill  . acetaminophen (TYLENOL) 325 MG tablet Take 650 mg by mouth as needed.    Marland Kitchen anastrozole (ARIMIDEX) 1 MG tablet Take 1 tablet (1 mg total) by mouth daily. 90 tablet 2  .  Cholecalciferol (VITAMIN D) 2000 UNITS tablet Take 2,000 Units by mouth daily.     No current facility-administered medications on file prior to visit.     Review of Systems    Review of Systems  Constitutional: Negative for fever, appetite change, fatigue and unexpected weight change.  Eyes: Negative for pain and visual disturbance.  Respiratory: Negative for cough and shortness of breath.   Cardiovascular: Negative for cp or palpitations    Gastrointestinal: Negative for nausea, diarrhea and constipation.  Genitourinary: Negative for urgency and frequency.  Skin: Negative for pallor and pos for chronic rash that does not itch Neurological: Negative for weakness, light-headedness, numbness and headaches.  Hematological: Negative for adenopathy. Does not bruise/bleed easily.  Psychiatric/Behavioral: Negative for dysphoric mood. The patient is not nervous/anxious.      Objective:   Physical Exam  Constitutional: She appears well-developed and well-nourished. No distress.  HENT:  Head: Normocephalic and atraumatic.  Right Ear: External ear normal.  Left Ear: External ear normal.  Nose: Nose normal.  Mouth/Throat: Oropharynx is clear and moist.  Eyes: Conjunctivae and EOM are normal. Pupils are equal, round, and reactive to light. Right eye exhibits no discharge. Left eye exhibits no discharge. No scleral icterus.  Neck: Normal range of motion. Neck supple. No JVD present. No thyromegaly present.  Cardiovascular: Normal rate, regular rhythm, normal heart sounds and intact distal pulses.  Exam reveals no gallop.   Pulmonary/Chest: Effort normal and breath sounds normal. No respiratory distress. She has no wheezes. She has no rales.  Abdominal: Soft. Bowel sounds are normal. She  exhibits no distension and no mass. There is no tenderness.  Genitourinary:  R mastectomy site clean   L breast- Breast exam: No mass, nodules, thickening, tenderness, bulging, retraction, inflamation, nipple discharge or skin changes noted.  No axillary or clavicular LA.      Musculoskeletal: She exhibits no edema or tenderness.  No kyphosis   Lymphadenopathy:    She has no cervical adenopathy.  Neurological: She is alert. She has normal reflexes. No cranial nerve deficit. She exhibits normal muscle tone. Coordination normal.  Skin: Skin is warm and dry. No rash noted. No erythema. No pallor.  Lesions of granuloma annulare on ext and back  No excoriations   Psychiatric: She has a normal mood and affect.          Assessment & Plan:   Problem List Items Addressed This Visit      Musculoskeletal and Integument   Granuloma annulare    More lesions with age Pt sees dermatology Not bothersome       Genitourinary   UTI (urinary tract infection)    Mildly pos ua on dip-not enough to cx or spin  tx with 5d with septra Enc fluid intake Update if not starting to improve in a week or if worsening      Relevant Medications      sulfamethoxazole-trimethoprim (BACTRIM DS) tablet 800-160 mg     Other   Encounter for Medicare annual wellness exam - Primary    Reviewed health habits including diet and exercise and skin cancer prevention Reviewed appropriate screening tests for age  Also reviewed health mt list, fam hx and immunization status , as well as social and family history   See HPI Labs reviewed  prevnar vaccine today  Will continue breast ca f/u  dexa utd   Enc to keep exercising     Hyperlipidemia    Disc goals for lipids and reasons to  control them Rev labs with pt Rev low sat fat diet in detail Good control with crestor and diet       Relevant Medications      rosuvastatin (CRESTOR) tablet      amLODIpine (NORVASC) tablet   Vitamin D deficiency    Vitamin  D level is therapeutic with current supplementation Disc importance of this to bone and overall health  Level in the 50s     Other Visit Diagnoses    Dysuria        Relevant Orders       POCT urinalysis dipstick (Completed)    Need for vaccination with 13-polyvalent pneumococcal conjugate vaccine        Relevant Orders       Pneumococcal conjugate vaccine 13-valent (Completed)

## 2014-02-21 NOTE — Patient Instructions (Signed)
prevnar vaccine today  Take septra for uti - update me if it does not improve  Continue exercising  Take care of yourself  Drink lots of water

## 2014-02-21 NOTE — Assessment & Plan Note (Signed)
Reviewed health habits including diet and exercise and skin cancer prevention Reviewed appropriate screening tests for age  Also reviewed health mt list, fam hx and immunization status , as well as social and family history   See HPI Labs reviewed  prevnar vaccine today  Will continue breast ca f/u  dexa utd   Enc to keep exercising

## 2014-02-21 NOTE — Assessment & Plan Note (Signed)
Vitamin D level is therapeutic with current supplementation Disc importance of this to bone and overall health Level in the 50s  

## 2014-02-21 NOTE — Assessment & Plan Note (Signed)
Disc goals for lipids and reasons to control them Rev labs with pt Rev low sat fat diet in detail Good control with crestor and diet   

## 2014-05-14 ENCOUNTER — Ambulatory Visit
Admit: 2014-05-14 | Disposition: A | Discharge status: Home / Self Care | Payer: Self-pay | Attending: Internal Medicine | Admitting: Internal Medicine

## 2014-05-25 ENCOUNTER — Ambulatory Visit
Admit: 2014-05-25 | Disposition: A | Discharge status: Home / Self Care | Payer: Self-pay | Attending: Internal Medicine | Admitting: Internal Medicine

## 2014-06-08 ENCOUNTER — Other Ambulatory Visit: Payer: Self-pay | Admitting: Internal Medicine

## 2014-06-08 DIAGNOSIS — M810 Age-related osteoporosis without current pathological fracture: Secondary | ICD-10-CM

## 2014-10-24 ENCOUNTER — Encounter: Payer: Self-pay | Admitting: Primary Care

## 2014-10-24 ENCOUNTER — Ambulatory Visit (INDEPENDENT_AMBULATORY_CARE_PROVIDER_SITE_OTHER): Payer: Medicare Other | Admitting: Primary Care

## 2014-10-24 VITALS — BP 158/52 | HR 107 | Temp 99.9°F | Ht 60.25 in | Wt 129.0 lb

## 2014-10-24 DIAGNOSIS — R05 Cough: Secondary | ICD-10-CM | POA: Diagnosis not present

## 2014-10-24 DIAGNOSIS — R059 Cough, unspecified: Secondary | ICD-10-CM

## 2014-10-24 DIAGNOSIS — R8290 Unspecified abnormal findings in urine: Secondary | ICD-10-CM

## 2014-10-24 LAB — POCT URINALYSIS DIPSTICK
BILIRUBIN UA: NEGATIVE
Glucose, UA: NEGATIVE
Leukocytes, UA: NEGATIVE
Nitrite, UA: NEGATIVE
SPEC GRAV UA: 1.02
UROBILINOGEN UA: NEGATIVE
pH, UA: 6

## 2014-10-24 MED ORDER — DOXYCYCLINE HYCLATE 100 MG PO TABS: 100.0000 mg | ORAL_TABLET | Freq: Two times a day (BID) | ORAL | Status: DC

## 2014-10-24 NOTE — Patient Instructions (Signed)
Start Doxycycline antibiotics. Take 1 tablet by mouth twice daily for 7 days.  Start Mucinex to help loosen mucous. Continue tylenol for fevers. Do not exceed 3000 mg in one day. Try Delsym or Robitussin for cough. This may be purchased over the counter.  Increase fluids.  Please notify us if symptoms do no improve in the next 5 days.  It was a pleasure meeting you!

## 2014-10-24 NOTE — Progress Notes (Signed)
Pre visit review using our clinic review tool, if applicable. No additional management support is needed unless otherwise documented below in the visit note. 

## 2014-10-24 NOTE — Progress Notes (Signed)
Subjective:    Patient ID: Madison Huerta, female    DOB: [birth year], 74 y.o.   MRN: [id number]  HPI  Madison Huerta is a 74 year old female who presents today with a chief complaint of fevers. Her symptoms began 2 weeks ago with fatigue. Her symptoms progressed to fevers, chills, cough, and nasal congestion. Thursday night her fever was 101, Friday 100, Tuesday this week 100. Overall she's feeling worse with the most bothersome symptoms being fatigue, cough and fevers. She's been taking tylenol with temporary relief. She also reports foul smelling urine yesterday. Denies dysuria, frequency, urgency.   Review of Systems  Constitutional: Positive for fever, chills and fatigue.  HENT: Positive for ear pain, postnasal drip and sinus pressure. Negative for congestion.   Respiratory: Positive for cough. Negative for shortness of breath.   Cardiovascular: Negative for chest pain.  Musculoskeletal: Positive for myalgias.       Past Medical History  Diagnosis Date  . Hyperlipidemia   . Hypertension   . Thyroid disease     Hypothyroidism  . Osteopenia   . History of breast cancer   . Overweight(278.02)   . VSD (ventricular septal defect)     does need SBE prophylaxis (Keflex  3 gm 1 hour pprior and 1.5 gm 6 hours post  . Heart murmur     VSD- echocardiogram - [facility] 10 yrs. ago  . Hypothyroidism   . GERD (gastroesophageal reflux disease)   . Breast cancer     L breast- 1994- lumpectomy, care [facility] at [facility]   . VSD (ventricular septal defect)     Social History   Social History  . Marital Status: Married    Spouse Name: N/A  . Number of Children: 2  . Years of Education: N/A   Occupational History  . Medical illustrator    Social History Main Topics  . Smoking status: Never Smoker   . Smokeless tobacco: Never Used  . Alcohol Use: Yes     Comment: 1 glass of wine daily  . Drug Use: No  . Sexual Activity: Yes   Other Topics Concern  . Not on file   Social History Narrative     Past Surgical History  Procedure Laterality Date  . Breast lumpectomy  1994    Radiation  . Parathyroidectomy  12/2002  . 2d echo  2004    Normal at [facility]  . Breast implant removal  1994  . Tonsillectomy      As a child  . Tubal ligation      1976  . Mastectomy  01/08/2012    RIGHT  . Mastectomy w/ sentinel node biopsy  01/08/2012    Procedure: MASTECTOMY WITH SENTINEL LYMPH NODE BIOPSY;  Surgeon: Madilyn Hook, DO;  Location: [place];  Service: General;  Laterality: Right;  right breast mastectomy with sentinel lymph node biopsy     Family History  Problem Relation Age of Onset  . Aneurysm Mother     brain  . Cancer Father     Lung, Liver mets  . Lung cancer Father   . Heart disease Other   . Cancer Cousin     Breast CA    Allergies  Allergen Reactions  . Codeine Other (See Comments)    severe headache  . Penicillins Rash  . Adhesive [Tape] Rash    Current Outpatient Prescriptions on File Prior to Visit  Medication Sig Dispense Refill  . acetaminophen (TYLENOL) 325 MG tablet Take 650 mg  by mouth as needed.    Marland Kitchen alendronate (FOSAMAX) 70 MG tablet take 1 tablet by mouth every week on an empty stomach with 6-8 oz of water. No food / meds for 30 min. Remain Upright 4 tablet 11  . amLODipine (NORVASC) 5 MG tablet Take 1 tablet (5 mg total) by mouth daily. 30 tablet 11  . anastrozole (ARIMIDEX) 1 MG tablet Take 1 tablet (1 mg total) by mouth daily. 90 tablet 2  . Cholecalciferol (VITAMIN D) 2000 UNITS tablet Take 2,000 Units by mouth daily.    Marland Kitchen levothyroxine (SYNTHROID, LEVOTHROID) 75 MCG tablet Take 1 tablet (75 mcg total) by mouth daily before breakfast. 30 tablet 11  . rosuvastatin (CRESTOR) 10 MG tablet Take 1 tablet (10 mg total) by mouth daily. 30 tablet 11  . sulfamethoxazole-trimethoprim (BACTRIM DS,SEPTRA DS) 800-160 MG per tablet Take 1 tablet by mouth 2 (two) times daily. (Patient not taking: Reported on 10/24/2014) 10 tablet 0   No current  facility-administered medications on file prior to visit.    BP 158/52 mmHg  Pulse 107  Temp(Src) 99.9 F (37.7 C) (Oral)  Ht 5' 0.25" (1.53 m)  Wt 129 lb (58.514 kg)  BMI 25.00 kg/m2  SpO2 97%    Objective:   Physical Exam  Constitutional: She appears well-nourished. She appears ill.  HENT:  Right Ear: Tympanic membrane and ear canal normal.  Left Ear: Tympanic membrane and ear canal normal.  Nose: Right sinus exhibits maxillary sinus tenderness. Right sinus exhibits no frontal sinus tenderness. Left sinus exhibits no maxillary sinus tenderness and no frontal sinus tenderness.  Mouth/Throat: Oropharynx is clear and moist.  Eyes: Conjunctivae are normal. Pupils are equal, round, and reactive to light.  Neck: Neck supple.  Cardiovascular: Regular rhythm.   Murmur heard. Sinus tachycardia  Pulmonary/Chest: Effort normal and breath sounds normal.  Lymphadenopathy:    She has no cervical adenopathy.  Skin: Skin is warm and dry.          Assessment & Plan:  Cough:  Present with fevers and fatigue x 2 weeks. Worse over past week with continued fevers. Cough present on exam, lungs clear and do not suspect pneumonia. UA: Negative for leuks and nitrites. Due to fevers, fatigue and cough will treat with Doxycycline BID x 7 days. Continue tylenol for fevers, start mucinex for mucous congestion, robitussin or delsym for cough. Push fluids, rest. Follow up PRN or if no improvement in 5 days.

## 2014-11-12 ENCOUNTER — Ambulatory Visit
Admission: RE | Admit: 2014-11-12 | Discharge: 2014-11-12 | Disposition: A | Discharge status: Home / Self Care | Payer: Medicare Other | Source: Ambulatory Visit | Attending: Internal Medicine | Admitting: Internal Medicine

## 2014-11-12 ENCOUNTER — Other Ambulatory Visit: Payer: Self-pay | Admitting: Internal Medicine

## 2014-11-12 DIAGNOSIS — Z13820 Encounter for screening for osteoporosis: Secondary | ICD-10-CM | POA: Insufficient documentation

## 2014-11-12 DIAGNOSIS — Z1231 Encounter for screening mammogram for malignant neoplasm of breast: Secondary | ICD-10-CM | POA: Diagnosis present

## 2014-11-12 DIAGNOSIS — M810 Age-related osteoporosis without current pathological fracture: Secondary | ICD-10-CM

## 2014-11-12 LAB — HM DEXA SCAN

## 2014-11-13 ENCOUNTER — Ambulatory Visit (INDEPENDENT_AMBULATORY_CARE_PROVIDER_SITE_OTHER): Payer: Medicare Other | Admitting: Family Medicine

## 2014-11-13 ENCOUNTER — Encounter: Payer: Self-pay | Admitting: Family Medicine

## 2014-11-13 ENCOUNTER — Ambulatory Visit (INDEPENDENT_AMBULATORY_CARE_PROVIDER_SITE_OTHER)
Admission: RE | Admit: 2014-11-13 | Discharge: 2014-11-13 | Disposition: A | Discharge status: Home / Self Care | Payer: Medicare Other | Source: Ambulatory Visit | Attending: Family Medicine | Admitting: Family Medicine

## 2014-11-13 VITALS — BP 140/70 | HR 90 | Temp 98.6°F | Ht 60.25 in | Wt 126.5 lb

## 2014-11-13 DIAGNOSIS — J069 Acute upper respiratory infection, unspecified: Secondary | ICD-10-CM

## 2014-11-13 MED ORDER — AZITHROMYCIN 250 MG PO TABS: ORAL_TABLET | ORAL | Status: DC

## 2014-11-13 MED ORDER — BENZONATATE 200 MG PO CAPS: 200.0000 mg | ORAL_CAPSULE | Freq: Three times a day (TID) | ORAL | Status: DC | PRN

## 2014-11-13 NOTE — Progress Notes (Signed)
Subjective:    Patient ID: Madison Huerta, female    DOB: 22-Mar-1940, 74 y.o.   MRN: [id number]  HPI  Here fur uri symptoms   Started about 4 weeks ago  Came in and saw Allie Bossier- given abx - had temp over 100 deg  Got better and then got worse again -treated with doxy   Now has hoarseness and cough  Low grade temp -99 for a few days Cough- productive and hacking - clear mucous  Some sinus pain -now gone  Feels like she has bronchitis  Not wheezing but chest feels tight at times  Tired   Throat and ears seem fine   Tylenol helps a lot  Tried some robitussin   Patient Active Problem List   Diagnosis Date Noted  . Acute URI 11/13/2014  . UTI (urinary tract infection) 02/21/2014  . 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 Encounter for Medicare annual wellness exam 02/20/2013  . Routine gynecological examination 02/19/2012  . Carcinoma of lower-inner quadrant of breast 12/11/2011  . Mass of right breast 11/25/2011  . PND (post-nasal drip) 12/25/2010  . Heartburn 12/18/2010  . Cough 11/06/2010  . Vitamin D deficiency 04/29/2009  . Hypothyroidism 02/06/2008  . UNSPECIFIED DISEASE OF THYMUS GLAND 01/26/2007  . Hyperlipidemia 01/26/2007  . Essential hypertension 01/26/2007  . Osteopenia 01/26/2007  . BREAST CANCER, HX OF 01/26/2007  . VENTRICULAR SEPTAL DEFECT, HX OF 01/26/2007   Past Medical History  Diagnosis Date  . Hyperlipidemia   . Hypertension   . Thyroid disease     Hypothyroidism  . Osteopenia   . History of breast cancer   . Overweight(278.02)   . VSD (ventricular septal defect)     does need SBE prophylaxis (Keflex  3 gm 1 hour pprior and 1.5 gm 6 hours post  . Heart murmur     VSD- echocardiogram - [facility] 10 yrs. ago  . Hypothyroidism   . GERD (gastroesophageal reflux disease)   . Breast cancer     L breast- 1994- lumpectomy, [facility] at [facility]   . VSD (ventricular septal defect)    Past Surgical History  Procedure Laterality Date  . Breast lumpectomy   1994    Radiation  . Parathyroidectomy  12/2002  . 2d echo  2004    Normal at [facility]  . Breast implant removal  1994  . Tonsillectomy      As a child  . Tubal ligation      1976  . Mastectomy  01/08/2012    RIGHT  . Mastectomy w/ sentinel node biopsy  01/08/2012    Procedure: MASTECTOMY WITH SENTINEL LYMPH NODE BIOPSY;  Surgeon: Madilyn Hook, DO;  Location: [place];  Service: General;  Laterality: Right;  right breast mastectomy with sentinel lymph node biopsy    Social History  Substance Use Topics  . Smoking status: Never Smoker   . Smokeless tobacco: Never Used  . Alcohol Use: 0.0 oz/week    0 Standard drinks or equivalent per week     Comment: 1 glass of wine occ   Family History  Problem Relation Age of Onset  . Aneurysm Mother     brain  . Cancer Father     Lung, Liver mets  . Lung cancer Father   . Heart disease Other   . Cancer Cousin     Breast CA  . Breast cancer Cousin   . Breast cancer Maternal Aunt 55   Allergies  Allergen Reactions  . Codeine  Other (See Comments)    severe headache  . Penicillins Rash  . Adhesive [Tape] Rash   Current Outpatient Prescriptions on File Prior to Visit  Medication Sig Dispense Refill  . acetaminophen (TYLENOL) 325 MG tablet Take 650 mg by mouth as needed.    Marland Kitchen alendronate (FOSAMAX) 70 MG tablet take 1 tablet by mouth every week on an empty stomach with 6-8 oz of water. No food / meds for 30 min. Remain Upright 4 tablet 11  . amLODipine (NORVASC) 5 MG tablet Take 1 tablet (5 mg total) by mouth daily. 30 tablet 11  . anastrozole (ARIMIDEX) 1 MG tablet Take 1 tablet (1 mg total) by mouth daily. 90 tablet 2  . Cholecalciferol (VITAMIN D) 2000 UNITS tablet Take 2,000 Units by mouth daily.    Marland Kitchen levothyroxine (SYNTHROID, LEVOTHROID) 75 MCG tablet Take 1 tablet (75 mcg total) by mouth daily before breakfast. 30 tablet 11  . rosuvastatin (CRESTOR) 10 MG tablet Take 1 tablet (10 mg total) by mouth daily. 30 tablet 11   No current  facility-administered medications on file prior to visit.    Review of Systems Review of Systems  Constitutional: Negative for , appetite change,  and unexpected weight change. pos for fatigue and mild malaise ENT neg for congestion or sinus pain, pos for post nasal drip  Eyes: Negative for pain and visual disturbance.  Respiratory: Negative for wheeze  and shortness of breath.  pos for prod cough  Cardiovascular: Negative for cp or palpitations    Gastrointestinal: Negative for nausea, diarrhea and constipation.  Genitourinary: Negative for urgency and frequency.  Skin: Negative for pallor or rash   Neurological: Negative for weakness, light-headedness, numbness and headaches.  Hematological: Negative for adenopathy. Does not bruise/bleed easily.  Psychiatric/Behavioral: Negative for dysphoric mood. The patient is not nervous/anxious.         Objective:   Physical Exam  Constitutional: She appears well-developed and well-nourished. No distress.  HENT:  Head: Normocephalic and atraumatic.  Right Ear: External ear normal.  Left Ear: External ear normal.  Mouth/Throat: Oropharynx is clear and moist.  Nares are boggy  No sinus tenderness Clear rhinorrhea and post nasal drip   Eyes: Conjunctivae and EOM are normal. Pupils are equal, round, and reactive to light. Right eye exhibits no discharge. Left eye exhibits no discharge.  Neck: Normal range of motion. Neck supple.  Cardiovascular: Normal rate and normal heart sounds.   Pulmonary/Chest: Effort normal and breath sounds normal. No respiratory distress. She has no wheezes. She has no rales. She exhibits no tenderness.  Harsh bs at bases No rales or rhonchi  Good air exch   Musculoskeletal: She exhibits no edema.  Lymphadenopathy:    She has no cervical adenopathy.  Neurological: She is alert.  Skin: Skin is warm and dry. No rash noted.  Psychiatric: She has a normal mood and affect.          Assessment & Plan:   Problem  List Items Addressed This Visit      Respiratory   Acute URI - Primary    Did improve with doxycycline-now cough is worsening and some intermittent low grade temp elevation  Disc symptomatic care - see instructions on AVS  CXR today  Px for zpak written -to hold for xr reading - ? Poss mycoplasma or other pneumonia (altough exam is reassuring)  Update if not starting to improve in a week or if worsening        Relevant Medications  azithromycin (ZITHROMAX Z-PAK) 250 MG tablet   Other Relevant Orders   DG Chest 2 View

## 2014-11-13 NOTE — Assessment & Plan Note (Signed)
Did improve with doxycycline-now cough is worsening and some intermittent low grade temp elevation  Disc symptomatic care - see instructions on AVS  CXR today  Px for zpak written -to hold for xr reading - ? Poss mycoplasma or other pneumonia (altough exam is reassuring)  Update if not starting to improve in a week or if worsening

## 2014-11-13 NOTE — Patient Instructions (Signed)
Hold px for zpak Chest xray now -will call with results and instruct further  Take the tessalon for cough as needed Fluids and rest Tylenol for fever and aches as needed    Update if not starting to improve in a week or if worsening

## 2014-11-13 NOTE — Progress Notes (Signed)
Pre visit review using our clinic review tool, if applicable. No additional management support is needed unless otherwise documented below in the visit note. 

## 2014-11-14 ENCOUNTER — Encounter: Payer: Self-pay | Admitting: Internal Medicine

## 2014-11-14 ENCOUNTER — Inpatient Hospital Stay: Payer: Medicare Other | Attending: Internal Medicine

## 2014-11-14 ENCOUNTER — Other Ambulatory Visit: Payer: Self-pay | Admitting: *Deleted

## 2014-11-14 ENCOUNTER — Inpatient Hospital Stay (HOSPITAL_BASED_OUTPATIENT_CLINIC_OR_DEPARTMENT_OTHER): Payer: Medicare Other | Admitting: Internal Medicine

## 2014-11-14 VITALS — BP 135/71 | HR 84 | Temp 99.0°F | Wt 126.1 lb

## 2014-11-14 DIAGNOSIS — Z170 Estrogen receptor positive status [ER+]: Secondary | ICD-10-CM | POA: Insufficient documentation

## 2014-11-14 DIAGNOSIS — E079 Disorder of thyroid, unspecified: Secondary | ICD-10-CM | POA: Insufficient documentation

## 2014-11-14 DIAGNOSIS — Z79811 Long term (current) use of aromatase inhibitors: Secondary | ICD-10-CM

## 2014-11-14 DIAGNOSIS — Z79899 Other long term (current) drug therapy: Secondary | ICD-10-CM | POA: Insufficient documentation

## 2014-11-14 DIAGNOSIS — K219 Gastro-esophageal reflux disease without esophagitis: Secondary | ICD-10-CM | POA: Diagnosis not present

## 2014-11-14 DIAGNOSIS — I10 Essential (primary) hypertension: Secondary | ICD-10-CM | POA: Diagnosis not present

## 2014-11-14 DIAGNOSIS — Z9223 Personal history of estrogen therapy: Secondary | ICD-10-CM

## 2014-11-14 DIAGNOSIS — Z923 Personal history of irradiation: Secondary | ICD-10-CM

## 2014-11-14 DIAGNOSIS — Z853 Personal history of malignant neoplasm of breast: Secondary | ICD-10-CM | POA: Insufficient documentation

## 2014-11-14 DIAGNOSIS — Q210 Ventricular septal defect: Secondary | ICD-10-CM | POA: Insufficient documentation

## 2014-11-14 DIAGNOSIS — Z9011 Acquired absence of right breast and nipple: Secondary | ICD-10-CM | POA: Insufficient documentation

## 2014-11-14 DIAGNOSIS — E785 Hyperlipidemia, unspecified: Secondary | ICD-10-CM | POA: Diagnosis not present

## 2014-11-14 DIAGNOSIS — C50911 Malignant neoplasm of unspecified site of right female breast: Secondary | ICD-10-CM

## 2014-11-14 DIAGNOSIS — M8580 Other specified disorders of bone density and structure, unspecified site: Secondary | ICD-10-CM

## 2014-11-14 DIAGNOSIS — E039 Hypothyroidism, unspecified: Secondary | ICD-10-CM | POA: Diagnosis not present

## 2014-11-14 DIAGNOSIS — C50319 Malignant neoplasm of lower-inner quadrant of unspecified female breast: Secondary | ICD-10-CM

## 2014-11-14 DIAGNOSIS — C50912 Malignant neoplasm of unspecified site of left female breast: Secondary | ICD-10-CM

## 2014-11-14 LAB — CBC WITH DIFFERENTIAL/PLATELET
BASOS ABS: 0.1 10*3/uL (ref 0–0.1)
BASOS PCT: 1 %
EOS ABS: 0.1 10*3/uL (ref 0–0.7)
EOS PCT: 1 %
HCT: 35.6 % (ref 35.0–47.0)
Hemoglobin: 11.9 g/dL — ABNORMAL LOW (ref 12.0–16.0)
Lymphocytes Relative: 12 %
Lymphs Abs: 1.2 10*3/uL (ref 1.0–3.6)
MCH: 29.2 pg (ref 26.0–34.0)
MCHC: 33.3 g/dL (ref 32.0–36.0)
MCV: 87.6 fL (ref 80.0–100.0)
MONO ABS: 1 10*3/uL — AB (ref 0.2–0.9)
Monocytes Relative: 10 %
Neutro Abs: 7.8 10*3/uL — ABNORMAL HIGH (ref 1.4–6.5)
Neutrophils Relative %: 76 %
PLATELETS: 275 10*3/uL (ref 150–440)
RBC: 4.06 MIL/uL (ref 3.80–5.20)
RDW: 13.3 % (ref 11.5–14.5)
WBC: 10.2 10*3/uL (ref 3.6–11.0)

## 2014-11-14 LAB — HEPATIC FUNCTION PANEL
ALT: 15 U/L (ref 14–54)
AST: 27 U/L (ref 15–41)
Albumin: 4.1 g/dL (ref 3.5–5.0)
Alkaline Phosphatase: 74 U/L (ref 38–126)
BILIRUBIN DIRECT: 0.1 mg/dL (ref 0.1–0.5)
BILIRUBIN INDIRECT: 0.7 mg/dL (ref 0.3–0.9)
Total Bilirubin: 0.8 mg/dL (ref 0.3–1.2)
Total Protein: 7.5 g/dL (ref 6.5–8.1)

## 2014-11-14 LAB — CREATININE, SERUM
CREATININE: 1.01 mg/dL — AB (ref 0.44–1.00)
GFR, EST NON AFRICAN AMERICAN: 54 mL/min — AB (ref >60)

## 2014-11-14 NOTE — Progress Notes (Signed)
Patient is here for follow up.  Recently had bone scan and mammogram.  Patient started on zpack for respiratory problems, had a chest x ray due to prolonged cough

## 2014-11-23 ENCOUNTER — Ambulatory Visit (INDEPENDENT_AMBULATORY_CARE_PROVIDER_SITE_OTHER): Payer: Medicare Other | Admitting: Family Medicine

## 2014-11-23 ENCOUNTER — Other Ambulatory Visit: Payer: Self-pay | Admitting: Family Medicine

## 2014-11-23 ENCOUNTER — Encounter: Payer: Self-pay | Admitting: Family Medicine

## 2014-11-23 VITALS — BP 148/62 | HR 90 | Temp 98.6°F | Ht 60.25 in | Wt 125.5 lb

## 2014-11-23 DIAGNOSIS — R509 Fever, unspecified: Secondary | ICD-10-CM | POA: Diagnosis not present

## 2014-11-23 LAB — CBC WITH DIFFERENTIAL/PLATELET
Basophils Absolute: 0.1 10*3/uL (ref 0.0–0.1)
Basophils Relative: 0.5 % (ref 0.0–3.0)
EOS PCT: 0.2 % (ref 0.0–5.0)
Eosinophils Absolute: 0 10*3/uL (ref 0.0–0.7)
HCT: 34.5 % — ABNORMAL LOW (ref 36.0–46.0)
HEMOGLOBIN: 11.5 g/dL — AB (ref 12.0–15.0)
Lymphocytes Relative: 11 % — ABNORMAL LOW (ref 12.0–46.0)
Lymphs Abs: 1.1 10*3/uL (ref 0.7–4.0)
MCHC: 33.3 g/dL (ref 30.0–36.0)
MCV: 88.1 fl (ref 78.0–100.0)
MONO ABS: 0.7 10*3/uL (ref 0.1–1.0)
MONOS PCT: 6.5 % (ref 3.0–12.0)
Neutro Abs: 8.3 10*3/uL — ABNORMAL HIGH (ref 1.4–7.7)
Neutrophils Relative %: 81.8 % — ABNORMAL HIGH (ref 43.0–77.0)
Platelets: 323 10*3/uL (ref 150.0–400.0)
RBC: 3.92 Mil/uL (ref 3.87–5.11)
RDW: 13.3 % (ref 11.5–15.5)
WBC: 10.2 10*3/uL (ref 4.0–10.5)

## 2014-11-23 NOTE — Progress Notes (Signed)
Pre visit review using our clinic review tool, if applicable. No additional management support is needed unless otherwise documented below in the visit note. 

## 2014-11-23 NOTE — Patient Instructions (Signed)
Urine and blood tests today  We will update you with results  If symptoms suddenly worsen- or temp goes up further please call and seek care at ER if necessary

## 2014-11-23 NOTE — Progress Notes (Signed)
Subjective:    Patient ID: Madison Huerta, female    DOB: 16-Jan-1941, 74 y.o.   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  Never had surgery  Very tiny - does not bother her - takes proph abx at the dentist  Has not had heart checked for a long time - no symptoms  Last echo was a while ago  Did get checked before her mastectomy  Lab Results  Component Value Date   WBC 10.2 11/14/2014   HGB 11.9* 11/14/2014   HCT 35.6 11/14/2014   MCV 87.6 11/14/2014   PLT 275 11/14/2014      Chemistry      Component Value Date/Time   NA 141 02/14/2014 0753   NA 144 05/05/2013 0807   K 3.9 02/14/2014 0753   K 3.8 05/05/2013 0807   CL 107 02/14/2014 0753   CL 105 05/06/2012 1433   CO2 26 02/14/2014 0753   CO2 25 05/05/2013 0807   BUN 16 02/14/2014 0753   BUN 15.6 05/05/2013 0807   CREATININE 1.01* 11/14/2014 1017   CREATININE 1.06 11/13/2013 1247   CREATININE 1.0 05/05/2013 0807      Component Value Date/Time   CALCIUM 9.6 02/14/2014 0753   CALCIUM 9.7 05/05/2013 0807   ALKPHOS 74 11/14/2014 1017   ALKPHOS 58 11/13/2013 1247   ALKPHOS 79 05/05/2013 0807   AST 27 11/14/2014 1017   AST 32 11/13/2013 1247   AST 27 05/05/2013 0807   ALT 15 11/14/2014 1017   ALT 31 11/13/2013 1247   ALT 19 05/05/2013 0807   BILITOT 0.8 11/14/2014 1017   BILITOT 0.7 11/13/2013 1247   BILITOT 0.64 05/05/2013 0807       Review of Systems Review of Systems  Constitutional: pos for episodic fatigue and malaise  Eyes: Negative for pain and visual  disturbance.  Respiratory: Negative for cough and shortness of breath.   Cardiovascular: Negative for cp or palpitations    Gastrointestinal: Negative for nausea, diarrhea and constipation.  Genitourinary: Negative for urgency and frequency.  Skin: Negative for pallor or rash  neg for tick or insect bites  Neurological: Negative for weakness, light-headedness, numbness and headaches.  Hematological: Negative for adenopathy. Does not bruise/bleed easily.  Psychiatric/Behavioral: Negative for dysphoric mood. The patient is not nervous/anxious.         Objective:   Physical Exam  Constitutional: She appears well-developed and well-nourished. No distress.  HENT:  Head: Normocephalic and atraumatic.  Left Ear: External ear normal.  Mouth/Throat: Oropharynx is clear and moist. No oropharyngeal exudate.  Nares are boggy  Eyes: Conjunctivae and EOM are normal. Pupils are equal, round, and reactive to light. Right eye exhibits no discharge. Left eye exhibits no discharge. No scleral icterus.  Neck: Normal range of motion. Neck supple. No JVD present. Carotid bruit is not present. No thyromegaly present.  Cardiovascular: Normal rate and regular rhythm.  Murmur heard. Baseline systolic M  Pulmonary/Chest: Effort normal and breath sounds normal. No respiratory distress. She has no wheezes. She has no rales. She exhibits no tenderness.  Abdominal: Soft. Bowel sounds are normal. She exhibits no distension and no mass. There is no tenderness. There is no rebound and no guarding.  Musculoskeletal: She exhibits no edema.  Lymphadenopathy:    She has no cervical adenopathy.  Neurological: She is alert. She has normal reflexes. No cranial nerve deficit. She exhibits normal muscle tone. Coordination normal.  Skin: Skin is warm and dry. No rash noted. She is not diaphoretic. No erythema. No pallor.  Psychiatric: She has a normal mood and affect.          Assessment & Plan:   Problem List Items  Addressed This Visit      Other   Fever - Primary    Ongoing and intermittent for over 4 weeks - originally with uri symptoms and now by itself  Reassuring exam  Hx of VSD- will get blood cultures in light of risk of endocarditis - and enc to update if symptoms /fever worsen  Also urine cx  Rev last labs         Relevant Orders   CBC with Differential/Platelet (Completed)   Urine culture   Culture, blood (single)   Culture, blood (single)

## 2014-11-24 LAB — URINE CULTURE

## 2014-11-25 ENCOUNTER — Emergency Department
Admission: EM | Admit: 2014-11-25 | Discharge: 2014-11-25 | Disposition: A | Discharge status: Home / Self Care | Payer: Medicare Other | Source: Home / Self Care

## 2014-11-25 ENCOUNTER — Encounter: Payer: Self-pay | Admitting: Family Medicine

## 2014-11-25 ENCOUNTER — Telehealth: Payer: Self-pay | Admitting: Family Medicine

## 2014-11-25 ENCOUNTER — Encounter: Payer: Self-pay | Admitting: Emergency Medicine

## 2014-11-25 DIAGNOSIS — I330 Acute and subacute infective endocarditis: Secondary | ICD-10-CM | POA: Diagnosis not present

## 2014-11-25 DIAGNOSIS — R7881 Bacteremia: Secondary | ICD-10-CM | POA: Diagnosis not present

## 2014-11-25 LAB — BASIC METABOLIC PANEL
Anion gap: 10 (ref 5–15)
BUN: 13 mg/dL (ref 6–20)
CHLORIDE: 102 mmol/L (ref 101–111)
CO2: 24 mmol/L (ref 22–32)
CREATININE: 0.91 mg/dL (ref 0.44–1.00)
Calcium: 9.2 mg/dL (ref 8.9–10.3)
GFR calc Af Amer: >60 mL/min (ref >60)
GFR calc non Af Amer: >60 mL/min (ref >60)
Glucose, Bld: 105 mg/dL — ABNORMAL HIGH (ref 65–99)
POTASSIUM: 4.1 mmol/L (ref 3.5–5.1)
SODIUM: 136 mmol/L (ref 135–145)

## 2014-11-25 LAB — CBC WITH DIFFERENTIAL/PLATELET
Basophils Absolute: 0.1 10*3/uL (ref 0–0.1)
Basophils Relative: 1 %
Eosinophils Absolute: 0 10*3/uL (ref 0–0.7)
Eosinophils Relative: 0 %
HEMATOCRIT: 33.3 % — AB (ref 35.0–47.0)
HEMOGLOBIN: 11.3 g/dL — AB (ref 12.0–16.0)
LYMPHS ABS: 1.3 10*3/uL (ref 1.0–3.6)
LYMPHS PCT: 12 %
MCH: 29.3 pg (ref 26.0–34.0)
MCHC: 33.8 g/dL (ref 32.0–36.0)
MCV: 86.7 fL (ref 80.0–100.0)
MONOS PCT: 9 %
Monocytes Absolute: 0.9 10*3/uL (ref 0.2–0.9)
NEUTROS ABS: 8.4 10*3/uL — AB (ref 1.4–6.5)
NEUTROS PCT: 78 %
Platelets: 306 10*3/uL (ref 150–440)
RBC: 3.84 MIL/uL (ref 3.80–5.20)
RDW: 13.2 % (ref 11.5–14.5)
WBC: 10.8 10*3/uL (ref 3.6–11.0)

## 2014-11-25 NOTE — Telephone Encounter (Signed)
I was able to speak to the ER, who will see the patient, alerted of + blood culture and ongoing fever.  Electronically Signed  By: Owens Loffler, MD On: 11/25/2014 9:45 AM

## 2014-11-25 NOTE — ED Notes (Signed)
Also has had fever for 3 weeks

## 2014-11-25 NOTE — Assessment & Plan Note (Signed)
Ongoing and intermittent for over 4 weeks - originally with uri symptoms and now by itself  Reassuring exam  Hx of VSD- will get blood cultures in light of risk of endocarditis - and enc to update if symptoms /fever worsen  Also urine cx  Rev last labs

## 2014-11-25 NOTE — ED Notes (Signed)
Was told to come to ED d/tsome abnormal blood cultures

## 2014-11-25 NOTE — Telephone Encounter (Signed)
Saw message, spoke to Dr Lorelei Pont and agree with inst to go to ED- she is feeling worse with pos blood cultures- we had disc the poss of endocarditis (with hx of VSD)  Will watch for hospital notes

## 2014-11-25 NOTE — Telephone Encounter (Signed)
Received critical lab call with + blood cultures, gram + cocci in chains. 1/2 vials  Last 4 notes reviewed.   Spoke with patient who is feeling unwell, fever to 102 on Friday, off and on from 100.5 - 102 for at least three weeks and approaching 4 weeks.   No UTI sx, no cold sx, no sore throat, no rashes.   PMN elevated at 82% with normal total WBC. S/p doxy and azithromycin treatment.   H/o breast cancer.   This case would be defined as fever of unknown origin.  Now with + blood culture x 1 and an unwell patient when I speak to her on the phone.   Recommended to the patient to go to the ER for further assessment and potentially admission. She will go to [facility] ER for assessment.   I am attempting to contact the ER personally, but I am having a very difficult time getting through.   Electronically Signed  By: Owens Loffler, MD On: 11/25/2014 9:22 AM

## 2014-11-26 ENCOUNTER — Encounter: Payer: Self-pay | Admitting: Emergency Medicine

## 2014-11-26 ENCOUNTER — Telehealth: Payer: Self-pay | Admitting: Family Medicine

## 2014-11-26 ENCOUNTER — Inpatient Hospital Stay (HOSPITAL_COMMUNITY)
Admit: 2014-11-26 | Discharge: 2014-11-26 | Disposition: A | Discharge status: Home / Self Care | Payer: Medicare Other | Attending: Internal Medicine | Admitting: Internal Medicine

## 2014-11-26 ENCOUNTER — Inpatient Hospital Stay
Admission: EM | Admit: 2014-11-26 | Discharge: 2014-11-30 | DRG: 289 | Disposition: A | Discharge status: Home / Self Care | Payer: Medicare Other | Attending: Internal Medicine | Admitting: Internal Medicine

## 2014-11-26 DIAGNOSIS — R509 Fever, unspecified: Secondary | ICD-10-CM | POA: Diagnosis not present

## 2014-11-26 DIAGNOSIS — H269 Unspecified cataract: Secondary | ICD-10-CM | POA: Diagnosis present

## 2014-11-26 DIAGNOSIS — M8580 Other specified disorders of bone density and structure, unspecified site: Secondary | ICD-10-CM | POA: Diagnosis present

## 2014-11-26 DIAGNOSIS — K219 Gastro-esophageal reflux disease without esophagitis: Secondary | ICD-10-CM | POA: Diagnosis present

## 2014-11-26 DIAGNOSIS — I330 Acute and subacute infective endocarditis: Secondary | ICD-10-CM | POA: Diagnosis present

## 2014-11-26 DIAGNOSIS — I10 Essential (primary) hypertension: Secondary | ICD-10-CM | POA: Diagnosis present

## 2014-11-26 DIAGNOSIS — E039 Hypothyroidism, unspecified: Secondary | ICD-10-CM | POA: Diagnosis present

## 2014-11-26 DIAGNOSIS — I38 Endocarditis, valve unspecified: Secondary | ICD-10-CM | POA: Diagnosis not present

## 2014-11-26 DIAGNOSIS — Z79899 Other long term (current) drug therapy: Secondary | ICD-10-CM

## 2014-11-26 DIAGNOSIS — Q210 Ventricular septal defect: Secondary | ICD-10-CM | POA: Diagnosis not present

## 2014-11-26 DIAGNOSIS — Z95828 Presence of other vascular implants and grafts: Secondary | ICD-10-CM

## 2014-11-26 DIAGNOSIS — Z885 Allergy status to narcotic agent status: Secondary | ICD-10-CM | POA: Diagnosis not present

## 2014-11-26 DIAGNOSIS — E785 Hyperlipidemia, unspecified: Secondary | ICD-10-CM | POA: Diagnosis present

## 2014-11-26 DIAGNOSIS — B954 Other streptococcus as the cause of diseases classified elsewhere: Secondary | ICD-10-CM | POA: Diagnosis present

## 2014-11-26 DIAGNOSIS — I288 Other diseases of pulmonary vessels: Secondary | ICD-10-CM | POA: Diagnosis not present

## 2014-11-26 DIAGNOSIS — E663 Overweight: Secondary | ICD-10-CM | POA: Diagnosis present

## 2014-11-26 DIAGNOSIS — Z880 Allergy status to penicillin: Secondary | ICD-10-CM | POA: Diagnosis not present

## 2014-11-26 DIAGNOSIS — Z853 Personal history of malignant neoplasm of breast: Secondary | ICD-10-CM

## 2014-11-26 DIAGNOSIS — R7881 Bacteremia: Secondary | ICD-10-CM | POA: Diagnosis present

## 2014-11-26 DIAGNOSIS — I340 Nonrheumatic mitral (valve) insufficiency: Secondary | ICD-10-CM | POA: Diagnosis not present

## 2014-11-26 DIAGNOSIS — Z91048 Other nonmedicinal substance allergy status: Secondary | ICD-10-CM | POA: Diagnosis not present

## 2014-11-26 DIAGNOSIS — M549 Dorsalgia, unspecified: Secondary | ICD-10-CM

## 2014-11-26 LAB — COMPREHENSIVE METABOLIC PANEL
ALK PHOS: 72 U/L (ref 38–126)
ALT: 15 U/L (ref 14–54)
ANION GAP: 11 (ref 5–15)
AST: 23 U/L (ref 15–41)
Albumin: 4.1 g/dL (ref 3.5–5.0)
BILIRUBIN TOTAL: 0.4 mg/dL (ref 0.3–1.2)
BUN: 14 mg/dL (ref 6–20)
CALCIUM: 8.9 mg/dL (ref 8.9–10.3)
CO2: 23 mmol/L (ref 22–32)
Chloride: 98 mmol/L — ABNORMAL LOW (ref 101–111)
Creatinine, Ser: 0.77 mg/dL (ref 0.44–1.00)
Glucose, Bld: 105 mg/dL — ABNORMAL HIGH (ref 65–99)
Potassium: 3.6 mmol/L (ref 3.5–5.1)
SODIUM: 132 mmol/L — AB (ref 135–145)
TOTAL PROTEIN: 7.6 g/dL (ref 6.5–8.1)

## 2014-11-26 LAB — CBC WITH DIFFERENTIAL/PLATELET
BASOS PCT: 1 %
Basophils Absolute: 0.1 10*3/uL (ref 0–0.1)
EOS ABS: 0 10*3/uL (ref 0–0.7)
Eosinophils Relative: 0 %
HCT: 33.3 % — ABNORMAL LOW (ref 35.0–47.0)
Hemoglobin: 11.3 g/dL — ABNORMAL LOW (ref 12.0–16.0)
LYMPHS ABS: 1.7 10*3/uL (ref 1.0–3.6)
Lymphocytes Relative: 16 %
MCH: 29.5 pg (ref 26.0–34.0)
MCHC: 33.9 g/dL (ref 32.0–36.0)
MCV: 86.9 fL (ref 80.0–100.0)
Monocytes Absolute: 1 10*3/uL — ABNORMAL HIGH (ref 0.2–0.9)
Monocytes Relative: 9 %
NEUTROS PCT: 74 %
Neutro Abs: 7.7 10*3/uL — ABNORMAL HIGH (ref 1.4–6.5)
Platelets: 334 10*3/uL (ref 150–440)
RBC: 3.83 MIL/uL (ref 3.80–5.20)
RDW: 13.1 % (ref 11.5–14.5)
WBC: 10.5 10*3/uL (ref 3.6–11.0)

## 2014-11-26 LAB — URINE CULTURE

## 2014-11-26 LAB — TROPONIN I

## 2014-11-26 MED ORDER — AMLODIPINE BESYLATE 5 MG PO TABS
5.0000 mg | ORAL_TABLET | Freq: Every day | ORAL | Status: DC
  Administered 2014-11-27 – 2014-11-30 (×4): 5 mg via ORAL
  Filled 2014-11-26 (×4): qty 1

## 2014-11-26 MED ORDER — ACETAMINOPHEN 325 MG PO TABS: 650.0000 mg | ORAL_TABLET | Freq: Four times a day (QID) | ORAL | Status: DC | PRN

## 2014-11-26 MED ORDER — VANCOMYCIN HCL IN DEXTROSE 750-5 MG/150ML-% IV SOLN
750.0000 mg | INTRAVENOUS | Status: DC
  Administered 2014-11-27 (×2): 750 mg via INTRAVENOUS
  Filled 2014-11-26 (×3): qty 150

## 2014-11-26 MED ORDER — ENOXAPARIN SODIUM 40 MG/0.4ML ~~LOC~~ SOLN
40.0000 mg | SUBCUTANEOUS | Status: DC
  Administered 2014-11-26 – 2014-11-29 (×4): 40 mg via SUBCUTANEOUS
  Filled 2014-11-26 (×4): qty 0.4

## 2014-11-26 MED ORDER — DEXTROSE 5 % IV SOLN
2.0000 g | INTRAVENOUS | Status: DC
  Administered 2014-11-26 – 2014-11-29 (×4): 2 g via INTRAVENOUS
  Filled 2014-11-26 (×6): qty 2

## 2014-11-26 MED ORDER — ROSUVASTATIN CALCIUM 10 MG PO TABS
10.0000 mg | ORAL_TABLET | Freq: Every day | ORAL | Status: DC
  Administered 2014-11-27 – 2014-11-30 (×4): 10 mg via ORAL
  Filled 2014-11-26 (×4): qty 1

## 2014-11-26 MED ORDER — DEXTROSE 5 % IV SOLN: 1.0000 g | Freq: Once | INTRAVENOUS | Status: DC

## 2014-11-26 MED ORDER — VANCOMYCIN HCL IN DEXTROSE 1-5 GM/200ML-% IV SOLN
1000.0000 mg | Freq: Once | INTRAVENOUS | Status: AC
  Administered 2014-11-26: 1000 mg via INTRAVENOUS
  Filled 2014-11-26: qty 200

## 2014-11-26 MED ORDER — ANASTROZOLE 1 MG PO TABS
1.0000 mg | ORAL_TABLET | Freq: Every day | ORAL | Status: DC
  Administered 2014-11-27 – 2014-11-30 (×4): 1 mg via ORAL
  Filled 2014-11-26 (×4): qty 1

## 2014-11-26 MED ORDER — ACETAMINOPHEN 650 MG RE SUPP: 650.0000 mg | Freq: Four times a day (QID) | RECTAL | Status: DC | PRN

## 2014-11-26 MED ORDER — LEVOTHYROXINE SODIUM 75 MCG PO TABS
75.0000 ug | ORAL_TABLET | Freq: Every day | ORAL | Status: DC
  Administered 2014-11-27 – 2014-11-30 (×4): 75 ug via ORAL
  Filled 2014-11-26 (×5): qty 1

## 2014-11-26 MED ORDER — SODIUM CHLORIDE 0.9 % IJ SOLN
3.0000 mL | Freq: Two times a day (BID) | INTRAMUSCULAR | Status: DC
  Administered 2014-11-26: 3 mL via INTRAVENOUS

## 2014-11-26 NOTE — Progress Notes (Signed)
ANTIBIOTIC CONSULT NOTE - INITIAL  Pharmacy Consult for vancomycin  Indication: endocarditis  Allergies  Allergen Reactions  . Codeine Other (See Comments)    Reaction:  Severe headaches   . Penicillins Hives and Other (See Comments)    Has patient had a PCN reaction causing immediate rash, facial/tongue/throat swelling, SOB or lightheadedness with hypotension: No Has patient had a PCN reaction causing severe rash involving mucus membranes or skin necrosis: No Has patient had a PCN reaction that required hospitalization No Has patient had a PCN reaction occurring within the last 10 years: No If all of the above answers are "NO", then may proceed with Cephalosporin use.  . Adhesive [Tape] Rash    Patient Measurements: Height: 5' (152.4 cm) Weight: 124 lb 3.2 oz (56.337 kg) IBW/kg (Calculated) : 45.5 Adjusted Body Weight: 50 kg  Vital Signs: Temp: 98 F (36.7 C) (10/03 1757) Temp Source: Oral (10/03 1757) BP: 149/53 mmHg (10/03 1757) Pulse Rate: 92 (10/03 1757) Intake/Output from previous day:   Intake/Output from this shift:    Labs:  Recent Labs  11/25/14 1030 11/26/14 1553  WBC 10.8 10.5  HGB 11.3* 11.3*  PLT 306 334  CREATININE 0.91 0.77   Estimated Creatinine Clearance: 50 mL/min (by C-G formula based on Cr of 0.77). No results for input(s): VANCOTROUGH, VANCOPEAK, VANCORANDOM, GENTTROUGH, GENTPEAK, GENTRANDOM, TOBRATROUGH, TOBRAPEAK, TOBRARND, AMIKACINPEAK, AMIKACINTROU, AMIKACIN in the last 72 hours.   Microbiology: Recent Results (from the past 720 hour(s))  Urine culture     Status: None   Collection Time: 11/25/14 10:30 AM  Result Value Ref Range Status   Specimen Description URINE, CLEAN CATCH  Final   Special Requests NONE  Final   Culture MULTIPLE SPECIES PRESENT, SUGGEST RECOLLECTION  Final   Report Status 11/26/2014 FINAL  Final  Blood culture (routine x 2)     Status: None (Preliminary result)   Collection Time: 11/25/14 10:30 AM  Result Value  Ref Range Status   Specimen Description BLOOD LEFT ASSIST CONTROL  Final   Special Requests BOTTLES DRAWN AEROBIC AND ANAEROBIC  5CC  Final   Culture  Setup Time   Final    GRAM POSITIVE COCCI IN CHAINS IN BOTH AEROBIC AND ANAEROBIC BOTTLES CRITICAL RESULT CALLED TO, READ BACK BY AND VERIFIED WITH: STACEY PIERCE 11/26/14 1434 JGF    Culture   Final    GRAM POSITIVE COCCI IN CHAINS IN BOTH AEROBIC AND ANAEROBIC BOTTLES IDENTIFICATION TO FOLLOW    Report Status PENDING  Incomplete  Blood culture (routine x 2)     Status: None (Preliminary result)   Collection Time: 11/25/14 10:31 AM  Result Value Ref Range Status   Specimen Description BLOOD RIGHT ASSIST CONTROL  Final   Special Requests BOTTLES DRAWN AEROBIC AND ANAEROBIC  3CC  Final   Culture  Setup Time   Final    GRAM POSITIVE COCCI IN CHAINS IN BOTH AEROBIC AND ANAEROBIC BOTTLES CRITICAL RESULT CALLED TO, READ BACK BY AND VERIFIED WITH: STACEY PIERCE 11/26/14 1434 JGF    Culture   Final    GRAM POSITIVE COCCI IN CHAINS IN BOTH AEROBIC AND ANAEROBIC BOTTLES IDENTIFICATION TO FOLLOW    Report Status PENDING  Incomplete    Medical History: Past Medical History  Diagnosis Date  . Hyperlipidemia   . Hypertension   . Thyroid disease     Hypothyroidism  . Osteopenia   . History of breast cancer   . Overweight(278.02)   . VSD (ventricular septal defect)  does need SBE prophylaxis (Keflex  3 gm 1 hour pprior and 1.5 gm 6 hours post  . Heart murmur     VSD- echocardiogram - [facility] 10 yrs. ago  . Hypothyroidism   . GERD (gastroesophageal reflux disease)   . Breast cancer ([place])     L breast- 1994- lumpectomy, care [facility] at [facility]   . VSD (ventricular septal defect)   . Cataract     Medications:  Anti-infectives    Start     Dose/Rate Route Frequency Ordered Stop   11/27/14 0330  vancomycin (VANCOCIN) IVPB 750 mg/150 ml premix     750 mg 150 mL/hr over 60 Minutes Intravenous Every 18 hours 11/26/14 1916     11/26/14 1800   cefTRIAXone (ROCEPHIN) 2 g in dextrose 5 % 50 mL IVPB     2 g 100 mL/hr over 30 Minutes Intravenous Every 24 hours 11/26/14 1659     11/26/14 1645  cefTRIAXone (ROCEPHIN) 1 g in dextrose 5 % 50 mL IVPB  Status:  Discontinued     1 g 100 mL/hr over 30 Minutes Intravenous  Once 11/26/14 1633 11/26/14 1659   11/26/14 1600  vancomycin (VANCOCIN) IVPB 1000 mg/200 mL premix     1,000 mg 200 mL/hr over 60 Minutes Intravenous  Once 11/26/14 1545 11/26/14 1735     Assessment: Pharmacy consulted to dose vancomycin on this 74 year old female who had blood cultures drawn outpatient that resulted in gram-positive cocci, no identification currently. Blood cultures drawn in ED and are pending.  Goal of Therapy:  Vancomycin trough level 15-20 mcg/ml  Plan:  Pt received vancomycin 1 g IV x1 in the ED Started on 750 mg IV q18 hrs (stacked dose at 11 hours) Trough ordered for steady state on 10/5 Pharmacy will follow cultures, renal function, and vancomycin levels and adjust doses as needed.  Kinetics: CrCl (adjusted body weight) ~ 50 mL/min Ke=0.048 Half-life: 14.44 hours Vd=35 L Estimated Cmin ~ 18  Thank you for the consult.  Darylene Price Allice Garro 11/26/2014,7:19 PM

## 2014-11-26 NOTE — Telephone Encounter (Signed)
Pt notified of Dr. Marliss Coots instructions and verbalized understanding. Pt said she will go back to ER for eval

## 2014-11-26 NOTE — H&P (Signed)
Madison Huerta NAME: Madison Huerta    MR#:  [id number]  DATE OF BIRTH:  1940-08-12  DATE OF ADMISSION:  11/26/2014  PRIMARY CARE PHYSICIAN: Loura Pardon, MD   REQUESTING/REFERRING PHYSICIAN: Dr. Marcelene Butte  CHIEF COMPLAINT:   Chief Complaint  Patient presents with  . Blood Infection    HISTORY OF PRESENT ILLNESS:  Madison Huerta  is a 74 y.o. female with a known history of VSD, breast cancer, hypothyroidism, hyperlipidemia. About 2 months ago she went to her dentist and took prophylactic antibiotics prior. About 6 or 7 weeks ago she is feeling weak and tired and she went to her doctor and got an antibiotics course. She's been having fever or chills and sweats and feeling weak ever since. She's been taking Tylenol every 4 hours for fever. The fever would go down but 4 hours later her temperature would come up again. She's been coughing up whitish phlegm. She had blood cultures drawn yesterday that were positive for gram-positive cocci in chains. She came to the ER yesterday but the wait was too long and she went home. She was called by her primary care physician to come back to the hospital today.  PAST MEDICAL HISTORY:   Past Medical History  Diagnosis Date  . Hyperlipidemia   . Hypertension   . Thyroid disease     Hypothyroidism  . Osteopenia   . History of breast cancer   . Overweight(278.02)   . VSD (ventricular septal defect)     does need SBE prophylaxis (Keflex  3 gm 1 hour pprior and 1.5 gm 6 hours post  . Heart murmur     VSD- echocardiogram - [facility] 10 yrs. ago  . Hypothyroidism   . GERD (gastroesophageal reflux disease)   . Breast cancer ([place])     L breast- 1994- lumpectomy, care for at [facility]   . VSD (ventricular septal defect)   . Cataract     PAST SURGICAL HISTORY:   Past Surgical History  Procedure Laterality Date  . Breast lumpectomy  1994    Radiation  . Parathyroidectomy  12/2002  . 2d echo  2004   Normal at [facility]  . Breast implant removal  1994  . Tonsillectomy      As a child  . Tubal ligation      1976  . Mastectomy  01/08/2012    RIGHT  . Mastectomy w/ sentinel node biopsy  01/08/2012    Procedure: MASTECTOMY WITH SENTINEL LYMPH NODE BIOPSY;  Surgeon: Madilyn Hook, DO;  Location: [facility];  Service: General;  Laterality: Right;  right breast mastectomy with sentinel lymph node biopsy     SOCIAL HISTORY:   Social History  Substance Use Topics  . Smoking status: Never Smoker   . Smokeless tobacco: Never Used  . Alcohol Use: 0.0 oz/week    0 Standard drinks or equivalent per week     Comment: 1 glass of wine occ    FAMILY HISTORY:   Family History  Problem Relation Age of Onset  . Aneurysm Mother     brain  . Cancer Father     Lung, Liver mets  . Lung cancer Father   . Heart disease Other   . Cancer Cousin     Breast CA  . Breast cancer Cousin   . Breast cancer Maternal Aunt 31    DRUG ALLERGIES:   Allergies  Allergen Reactions  . Codeine Other (See Comments)  Reaction:  Severe headaches   . Penicillins Hives and Other (See Comments)    Has patient had a PCN reaction causing immediate rash, facial/tongue/throat swelling, SOB or lightheadedness with hypotension: No Has patient had a PCN reaction causing severe rash involving mucus membranes or skin necrosis: No Has patient had a PCN reaction that required hospitalization No Has patient had a PCN reaction occurring within the last 10 years: No If all of the above answers are "NO", then may proceed with Cephalosporin use.  . Adhesive [Tape] Rash    REVIEW OF SYSTEMS:  CONSTITUTIONAL: Positive for fever, chills and sweats. Positive for fatigue. Some weight loss. EYES: No blurred or double vision. Wears glasses. EARS, NOSE, AND THROAT: No tinnitus or ear pain. No sore throat RESPIRATORY: Positive for cough with whitish phlegm, no shortness of breath, wheezing or hemoptysis.  CARDIOVASCULAR: No chest pain,  orthopnea, edema.  GASTROINTESTINAL: No nausea, vomiting, diarrhea or abdominal pain. Occasionally her hemorrhoids bleed. GENITOURINARY: No dysuria, hematuria.  ENDOCRINE: No polyuria, nocturia,  HEMATOLOGY: No anemia, easy bruising or bleeding SKIN: No rash or lesion. MUSCULOSKELETAL: Positive for joint pains but nothing unusual for her.   NEUROLOGIC: No tingling, numbness, weakness.  PSYCHIATRY: No anxiety or depression.   MEDICATIONS AT HOME:   Prior to Admission medications   Medication Sig Start Date End Date Taking? Authorizing Provider  acetaminophen (TYLENOL) 500 MG tablet Take 500 mg by mouth every 4 (four) hours as needed for fever.   Yes Historical Provider, MD  alendronate (FOSAMAX) 70 MG tablet take 1 tablet by mouth every week on an empty stomach with 6-8 oz of water. No food / meds for 30 min. Remain Upright Patient taking differently: Take 70 mg by mouth once a week. Pt takes on Sunday. 02/21/14  Yes Abner Greenspan, MD  amLODipine (NORVASC) 5 MG tablet Take 1 tablet (5 mg total) by mouth daily. 02/21/14  Yes Abner Greenspan, MD  anastrozole (ARIMIDEX) 1 MG tablet Take 1 tablet (1 mg total) by mouth daily. 05/15/13  Yes Consuela Mimes, MD  levothyroxine (SYNTHROID, LEVOTHROID) 75 MCG tablet Take 1 tablet (75 mcg total) by mouth daily before breakfast. 02/21/14  Yes Abner Greenspan, MD  rosuvastatin (CRESTOR) 10 MG tablet Take 1 tablet (10 mg total) by mouth daily. 02/21/14  Yes Abner Greenspan, MD  benzonatate (TESSALON) 200 MG capsule Take 1 capsule (200 mg total) by mouth 3 (three) times daily as needed for cough (swallow whole, do not bite pill). Patient not taking: Reported on 11/26/2014 11/13/14   Abner Greenspan, MD      VITAL SIGNS:  Blood pressure 154/66, pulse 85, height 5' (1.524 m), weight 56.246 kg (124 lb), SpO2 96 %.  PHYSICAL EXAMINATION:  GENERAL:  74 y.o.-year-old patient lying in the bed with no acute distress.  EYES: Pupils equal, round, reactive to light and  accommodation. No scleral icterus. Extraocular muscles intact.  HEENT: Head atraumatic, normocephalic. Oropharynx and nasopharynx clear.  NECK:  Supple, no jugular venous distention. No thyroid enlargement, no tenderness.  LUNGS: Normal breath sounds bilaterally, no wheezing, rales,rhonchi or crepitation. No use of accessory muscles of respiration.  CARDIOVASCULAR: S1, S2 normal. 4/6 holosystolic murmur, no rubs, or gallops.  ABDOMEN: Soft, nontender, nondistended. Bowel sounds present. No organomegaly or mass.  EXTREMITIES: No pedal edema, cyanosis, or clubbing.  NEUROLOGIC: Cranial nerves II through XII are intact. Muscle strength 5/5 in all extremities. Sensation intact. Gait not checked.  PSYCHIATRIC: The patient is alert and  oriented x 3.  SKIN: No rash, lesion, or ulcer.   LABORATORY PANEL:   CBC  Recent Labs Lab 11/26/14 1553  WBC 10.5  HGB 11.3*  HCT 33.3*  PLT 334   ------------------------------------------------------------------------------------------------------------------  Chemistries   Recent Labs Lab 11/25/14 1030  NA 136  K 4.1  CL 102  CO2 24  GLUCOSE 105*  BUN 13  CREATININE 0.91  CALCIUM 9.2   ------------------------------------------------------------------------------------------------------------------  EKG:   Normal sinus rhythm 93 bpm  IMPRESSION AND PLAN:   1. 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 The patient had dental work about 2 months ago and has not felt well since. I will start on Rocephin 2 g IV daily and vancomycin to be dosed by pharmacy. I will get an infectious disease consultation and cardiology consultation. Started off with a TTE.  If that is negative we'll need to proceed with a TEE. Case discussed with cardiology and I will keep the patient nothing by mouth after midnight just in case TEE is needed. 2. Hypothyroidism unspecified continue levothyroxine. 3.  Hyperlipidemia unspecified continue Crestor. 4. History of breast cancer- on arimidex 5. Essential hypertension on Norvasc.  All the records are reviewed and case discussed with ED provider. Management plans discussed with the patient, family and they are in agreement.  CODE STATUS: Full code  TOTAL TIME TAKING CARE OF THIS PATIENT: 55 minutes.    Loletha Grayer M.D on 11/26/2014 at 5:07 PM  Between 7am to 6pm - Pager - [phone]  After 6pm call admission pager [facility]  Office  [phone]  CC: Primary care physician; Loura Pardon, MD

## 2014-11-26 NOTE — ED Provider Notes (Signed)
Time Seen: Approximately ----------------------------------------- 3:56 PM on 11/26/2014 -----------------------------------------   I have reviewed the triage notes  Chief Complaint: Blood Infection   History of Present Illness: Madison Huerta is a 74 y.o. female who states she's not felt well over the last 6 weeks and has had a low-grade fever every day for the last 4 weeks. Patient's been on 2 separate courses of antibiotics which included azithromycin along with doxycycline. Patient still had persistent fevers and was here at the emergency department yesterday and left without being seen. She was seen by her primary physician and due to her ventricular septal defect performed blood cultures out of the office. Her 2 blood cultures were positive for gram-positive cocci in chains. Patient's had chest x-rays along with urine and urine culture recently. She denies any chest pain or shortness of breath. Patient also had a single blood culture here approximately 4 days ago. Patient did have dental work approximately 2 months ago. She states she always takes antibiotics when she has dental work due to her heart murmur. She denies any swelling in her fingertips or any other new skin lesions.   Past Medical History  Diagnosis Date  . Hyperlipidemia   . Hypertension   . Thyroid disease     Hypothyroidism  . Osteopenia   . History of breast cancer   . Overweight(278.02)   . VSD (ventricular septal defect)     does need SBE prophylaxis (Keflex  3 gm 1 hour pprior and 1.5 gm 6 hours post  . Heart murmur     VSD- echocardiogram - [facility] 10 yrs. ago  . Hypothyroidism   . GERD (gastroesophageal reflux disease)   . Breast cancer ([place])     L breast- 1994- lumpectomy, care for at [facility]   . VSD (ventricular septal defect)   . Cataract     Patient Active Problem List   Diagnosis Date Noted  . Fever 11/23/2014  . Acute URI 11/13/2014  . UTI (urinary tract infection) 02/21/2014  . 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 Encounter for Medicare annual wellness exam 02/20/2013  . Routine gynecological examination 02/19/2012  . Carcinoma of lower-inner quadrant of breast ([place]) 12/11/2011  . Mass of right breast 11/25/2011  . PND (post-nasal drip) 12/25/2010  . Heartburn 12/18/2010  . Cough 11/06/2010  . Vitamin D deficiency 04/29/2009  . Hypothyroidism 02/06/2008  . UNSPECIFIED DISEASE OF THYMUS GLAND 01/26/2007  . Hyperlipidemia 01/26/2007  . Essential hypertension 01/26/2007  . Osteopenia 01/26/2007  . BREAST CANCER, HX OF 01/26/2007  . VENTRICULAR SEPTAL DEFECT, HX OF 01/26/2007    Past Surgical History  Procedure Laterality Date  . Breast lumpectomy  1994    Radiation  . Parathyroidectomy  12/2002  . 2d echo  2004    Normal at [facility]  . Breast implant removal  1994  . Tonsillectomy      As a child  . Tubal ligation      1976  . Mastectomy  01/08/2012    RIGHT  . Mastectomy w/ sentinel node biopsy  01/08/2012    Procedure: MASTECTOMY WITH SENTINEL LYMPH NODE BIOPSY;  Surgeon: Madilyn Hook, DO;  Location: [place];  Service: General;  Laterality: Right;  right breast mastectomy with sentinel lymph node biopsy     Past Surgical History  Procedure Laterality Date  . Breast lumpectomy  1994    Radiation  . Parathyroidectomy  12/2002  . 2d echo  2004    Normal at [facility]  .  Breast implant removal  1994  . Tonsillectomy      As a child  . Tubal ligation      1976  . Mastectomy  01/08/2012    RIGHT  . Mastectomy w/ sentinel node biopsy  01/08/2012    Procedure: MASTECTOMY WITH SENTINEL LYMPH NODE BIOPSY;  Surgeon: Madilyn Hook, DO;  Location: [place];  Service: General;  Laterality: Right;  right breast mastectomy with sentinel lymph node biopsy     Current Outpatient Rx  Name  Route  Sig  Dispense  Refill  . acetaminophen (TYLENOL) 325 MG tablet   Oral   Take 650 mg by mouth as needed.         Marland Kitchen alendronate (FOSAMAX) 70 MG tablet      take 1 tablet by mouth  every week on an empty stomach with 6-8 oz of water. No food / meds for 30 min. Remain Upright   4 tablet   11   . amLODipine (NORVASC) 5 MG tablet   Oral   Take 1 tablet (5 mg total) by mouth daily.   30 tablet   11   . anastrozole (ARIMIDEX) 1 MG tablet   Oral   Take 1 tablet (1 mg total) by mouth daily.   90 tablet   2   . benzonatate (TESSALON) 200 MG capsule   Oral   Take 1 capsule (200 mg total) by mouth 3 (three) times daily as needed for cough (swallow whole, do not bite pill).   30 capsule   0   . Cholecalciferol (VITAMIN D) 2000 UNITS tablet   Oral   Take 2,000 Units by mouth daily.         Marland Kitchen levothyroxine (SYNTHROID, LEVOTHROID) 75 MCG tablet   Oral   Take 1 tablet (75 mcg total) by mouth daily before breakfast.   30 tablet   11   . rosuvastatin (CRESTOR) 10 MG tablet   Oral   Take 1 tablet (10 mg total) by mouth daily.   30 tablet   11     Allergies:  Codeine; Penicillins; and Adhesive  Family History: Family History  Problem Relation Age of Onset  . Aneurysm Mother     brain  . Cancer Father     Lung, Liver mets  . Lung cancer Father   . Heart disease Other   . Cancer Cousin     Breast CA  . Breast cancer Cousin   . Breast cancer Maternal Aunt 55    Social History: Social History  Substance Use Topics  . Smoking status: Never Smoker   . Smokeless tobacco: Never Used  . Alcohol Use: 0.0 oz/week    0 Standard drinks or equivalent per week     Comment: 1 glass of wine occ     Review of Systems:   10 point review of systems was performed and was otherwise negative:  Constitutional: No fever Eyes: No visual disturbances ENT: No sore throat, ear pain Cardiac: No chest pain Respiratory: No shortness of breath, wheezing, or stridor Abdomen: No abdominal pain, no vomiting, No diarrhea Endocrine: No weight loss, No night sweats Extremities: No peripheral edema, cyanosis Skin: Patient has a chronic unspecified rash she states that  she's not sure the name of it but it doesn't respond to any treatment. Neurologic: No focal weakness, trouble with speech or swollowing Urologic: No dysuria, Hematuria, or urinary frequency   Physical Exam:  ED Triage Vitals  Enc Vitals Group  BP --      Pulse --      Resp --      Temp --      Temp src --      SpO2 --      Weight --      Height --      Head Cir --      Peak Flow --      Pain Score 11/26/14 1250 5     Pain Loc --      Pain Edu? --      Excl. in [place]? --     General: Awake , Alert , and Oriented times 3; GCS 15 Head: Normal cephalic , atraumatic Eyes: Pupils equal , round, reactive to light Nose/Throat: No nasal drainage, patent upper airway without erythema or exudate.  Neck: Supple, Full range of motion, No anterior adenopathy or palpable thyroid masses Lungs: Clear to ascultation without wheezes , rhonchi, or rales Heart: Regular rate, regular rhythm with a coarse grade 4/6 systolic ejection murmur heard throughout. Abdomen: Soft, non tender without rebound, guarding , or rigidity; bowel sounds positive and symmetric in all 4 quadrants. No organomegaly .        Extremities: 2 plus symmetric pulses. No edema, clubbing or cyanosis Neurologic: normal ambulation, Motor symmetric without deficits, sensory intact Skin: warm, dry, no rashes. No nodules or splinter hemorrhages   Labs:   All laboratory work was reviewed including any pertinent negatives or positives listed below:  Labs Reviewed  CULTURE, BLOOD (ROUTINE X 2)  CULTURE, BLOOD (ROUTINE X 2)  CBC WITH DIFFERENTIAL/PLATELET  COMPREHENSIVE METABOLIC PANEL  TROPONIN I   patient had review of her laboratory work that showed no significant findings here in emergency department. Blood cultures 2 were obtained prior to antibiotic therapy Blood cultures from outpatient management was reviewed  EKG:  ED ECG REPORT I, Daymon Larsen, the attending physician, personally viewed and interpreted this  ECG.  Date: 11/26/2014 EKG Time: 1259 Rate: 93 Rhythm: normal sinus rhythm QRS Axis: normal Intervals: normal ST/T Wave abnormalities: normal Conduction Disutrbances: none Narrative Interpretation: unremarkable    ED Course:  Patient's stay was uneventful and she remained hemodynamically stable. Patient was started on IV vancomycin followed by IV Rocephin. She states they allergy to penicillin though she states that she's tolerated Keflex adequately. The presumed diagnosis is endocarditis due to likely the patient's dental procedure 2 months ago. Patient will likely require echocardiogram to assess her VSD and valvular function etc. Patient's case was reviewed with the hospitalist team, further disposition and management depends upon their evaluation.    Assessment:  Bacteremia History of VSD Possible endocarditis   Plan Inpatient management           Daymon Larsen, MD 11/26/14 2151

## 2014-11-26 NOTE — Telephone Encounter (Signed)
Please see my prev comments -thanks

## 2014-11-26 NOTE — Telephone Encounter (Signed)
Pt advised of Dr. Marliss Coots comments/insturctions and advise of Dr. Marliss Coots concerns Pt said that ER drew blood and she waited for 7 hours so she left, and that was it, I advise pt that she may need IV abx and she wanted to know if that is something we can set up, I advise her that usually that has to be done at hospital but she wanted me to check with Dr. Glori Bickers to see what she thinks

## 2014-11-26 NOTE — Progress Notes (Signed)
*  PRELIMINARY RESULTS* Echocardiogram 2D Echocardiogram has been performed.  Madison Huerta 11/26/2014, 7:32 PM

## 2014-11-26 NOTE — Telephone Encounter (Signed)
PLEASE NOTE: All timestamps contained within this report are represented as [place] Standard Time. CONFIDENTIALTY NOTICE: This fax transmission is intended only for the addressee. It contains information that is legally privileged, confidential or otherwise protected from use or disclosure. If you are not the intended recipient, you are strictly prohibited from reviewing, disclosing, copying using or disseminating any of this information or taking any action in reliance on or regarding this information. If you have received this fax in error, please notify us immediately by telephone so that we can arrange for its return to [place]. Phone: [phone], Toll-Free: [phone], Fax: [phone] Page: 1 of 2 Call Id: [id number] [place] Patient Name: Madison Huerta Gender: Female DOB: 1940/05/10 Age: 74 Y 5 M 9 D Return Phone Number: Address: City/State/Zip:  Client [place] Night - Client Client Site [place] - Night Contact Type Call Call Type Triage / Clinical Caller Name Dreama Saa Relationship To Patient Other Return Phone Number Please choose phone number Chief Complaint Lab Result (Critical) Initial Comment Dreama Saa with [facility] calling to report critical lab results. Call back # is [phone]. Nurse Assessment Nurse: Neil Crouch RN, [facility] Date/Time Eilene Ghazi Time): 11/25/2014 2:22:50 AM Is there an on-call provider listed? ---Yes Please list name of person reporting value (Lab Employee) and a contact number. ---Dreama Saa with [facility] calling to report critical lab results. Call back # is [phone]. Please document the following items: Lab name Lab value (read back to lab to verify) Reference range for lab value Date and time blood was drawn Collect time of birth for  bilirubin results ---lab value- positive blood culture. gram positive cocci growing in chains. specimen collected 11/24/2014 @ 0946. Please collect the patient contact information from the lab. (name, phone number and address) ---no phone number available List any special notes provided by lab. ---one set of bottles, 2 bottles, only the anaerobic growing . no physician listed nor if spec collected via central line. Guidelines Guideline Title Affirmed Question Affirmed Notes Nurse Date/Time (Eastern Time) Disp. Time Eilene Ghazi Time) Disposition Final User 11/25/2014 2:28:36 AM Clinical Call Neil Crouch, RN, [facility] 11/25/2014 2:29:08 AM Send To Extended Follow Up Lenis Noon 11/25/2014 8:16:47 AM Called On-Call Provider Cox, RN, [facility] [id number]/09/5460 7:03:50 AM Clinical Call Yes Cox, RN, [facility] After Care Instructions Given Call Event Type User Date / Time Description Comments User: George Ina, RN Date/Time Eilene Ghazi Time): 11/25/2014 2:28:13 AM PLEASE NOTE: All timestamps contained within this report are represented as [place] Standard Time. CONFIDENTIALTY NOTICE: This fax transmission is intended only for the addressee. It contains information that is legally privileged, confidential or otherwise protected from use or disclosure. If you are not the intended recipient, you are strictly prohibited from reviewing, disclosing, copying using or disseminating any of this information or taking any action in reliance on or regarding this information. If you have received this fax in error, please notify us immediately by telephone so that we can arrange for its return to [place]. Phone: [phone], Toll-Free: [phone], Fax: [phone] Page: 2 of 2 Call Id: [id number] Comments per client directive, to place chart in extended queue, someone to call the on call physician after 0700. Paging DoctorName Phone DateTime Result/Outcome Message Type Notes Owens Loffler  [phone] 11/25/2014 8:16:47 AM Called On Call Provider - Reached Doctor Paged Owens Loffler 11/25/2014 8:20:53 AM Spoke with On Call - General Message Result MD  notified of critical lab, verbalized understnading

## 2014-11-26 NOTE — Telephone Encounter (Signed)
Pt is requesting call back regarding blood cultures, and her ed visit on Friday.  She was in the ed [facility]) for 7 hours.  She wants to know how serious it is, and would like call back. cb number is [phone]

## 2014-11-26 NOTE — ED Notes (Signed)
Reports unexplained fevers off and on x 2 wks. MD did bloodwork and told her to come in for positive blood cx.

## 2014-11-26 NOTE — Telephone Encounter (Signed)
I cannot set that up - she will need further evaluation - and possibly cardiac eval as well  I do want her to go to the ED  [facility] or [facility] or [facility] are fine - perhaps being a week day they will be less busy , but please call ahead to wherever she goes and tell triage she has fever/pos blood culture and -poss endocarditis (hx of VSD)- that she is incoming  Thanks

## 2014-11-26 NOTE — Telephone Encounter (Signed)
I've been watching for ED records and only saw a nursing note - what did they do for her?   The pos blood cultures concern me for endocarditis (with her VSD) and she will likely need IV antibiotics  Now in the setting of higher fever it is more concerning  Did she get any abx in the ED ?

## 2014-11-26 NOTE — ED Notes (Signed)
Lab reports blood cx from yesterday show both anerobic bottles +gram cocci in chains

## 2014-11-26 NOTE — Progress Notes (Signed)
Pt admitted to 234. Pt oriented to the room call bell ascom phone and staff. Bed in low position, fall safety plan reviewed , contract signed and placed on wall, brown non skid socks in place. Full assessment to epic, skin assessed with maddie hines. Will continue to monitor

## 2014-11-27 ENCOUNTER — Inpatient Hospital Stay: Payer: Medicare Other

## 2014-11-27 ENCOUNTER — Inpatient Hospital Stay (HOSPITAL_COMMUNITY)
Admit: 2014-11-27 | Discharge: 2014-11-27 | Disposition: A | Discharge status: Home / Self Care | Payer: Medicare Other | Attending: Cardiovascular Disease | Admitting: Cardiovascular Disease

## 2014-11-27 DIAGNOSIS — I38 Endocarditis, valve unspecified: Secondary | ICD-10-CM

## 2014-11-27 DIAGNOSIS — Q210 Ventricular septal defect: Secondary | ICD-10-CM

## 2014-11-27 DIAGNOSIS — I10 Essential (primary) hypertension: Secondary | ICD-10-CM

## 2014-11-27 DIAGNOSIS — E785 Hyperlipidemia, unspecified: Secondary | ICD-10-CM

## 2014-11-27 DIAGNOSIS — I340 Nonrheumatic mitral (valve) insufficiency: Secondary | ICD-10-CM

## 2014-11-27 DIAGNOSIS — R7881 Bacteremia: Secondary | ICD-10-CM | POA: Insufficient documentation

## 2014-11-27 LAB — BASIC METABOLIC PANEL
ANION GAP: 9 (ref 5–15)
BUN: 12 mg/dL (ref 6–20)
CALCIUM: 8.7 mg/dL — AB (ref 8.9–10.3)
CO2: 24 mmol/L (ref 22–32)
Chloride: 106 mmol/L (ref 101–111)
Creatinine, Ser: 0.71 mg/dL (ref 0.44–1.00)
Glucose, Bld: 101 mg/dL — ABNORMAL HIGH (ref 65–99)
POTASSIUM: 3.9 mmol/L (ref 3.5–5.1)
SODIUM: 139 mmol/L (ref 135–145)

## 2014-11-27 LAB — CBC
HEMATOCRIT: 31.2 % — AB (ref 35.0–47.0)
HEMOGLOBIN: 10.4 g/dL — AB (ref 12.0–16.0)
MCH: 28.7 pg (ref 26.0–34.0)
MCHC: 33.2 g/dL (ref 32.0–36.0)
MCV: 86.4 fL (ref 80.0–100.0)
Platelets: 306 10*3/uL (ref 150–440)
RBC: 3.61 MIL/uL — ABNORMAL LOW (ref 3.80–5.20)
RDW: 13 % (ref 11.5–14.5)
WBC: 7.8 10*3/uL (ref 3.6–11.0)

## 2014-11-27 MED ORDER — SODIUM CHLORIDE 0.9 % IV SOLN: INTRAVENOUS | Status: DC

## 2014-11-27 MED ORDER — GADOBENATE DIMEGLUMINE 529 MG/ML IV SOLN: 15.0000 mL | Freq: Once | INTRAVENOUS | Status: DC | PRN

## 2014-11-27 MED ORDER — LORAZEPAM 2 MG/ML IJ SOLN
INTRAMUSCULAR | Status: AC | PRN
  Administered 2014-11-27: 1 mg via INTRAVENOUS

## 2014-11-27 MED ORDER — SODIUM CHLORIDE 0.9 % IJ SOLN: 3.0000 mL | INTRAMUSCULAR | Status: DC | PRN

## 2014-11-27 MED ORDER — FENTANYL CITRATE (PF) 100 MCG/2ML IJ SOLN
INTRAMUSCULAR | Status: AC | PRN
  Administered 2014-11-27 (×4): 25 ug via INTRAVENOUS

## 2014-11-27 MED ORDER — LIDOCAINE VISCOUS 2 % MT SOLN
OROMUCOSAL | Status: AC
  Administered 2014-11-27: 10:00:00
  Filled 2014-11-27: qty 15

## 2014-11-27 MED ORDER — MIDAZOLAM HCL 2 MG/2ML IJ SOLN
INTRAMUSCULAR | Status: AC | PRN
  Administered 2014-11-27 (×4): 1 mg via INTRAVENOUS

## 2014-11-27 MED ORDER — MIDAZOLAM HCL 5 MG/5ML IJ SOLN
INTRAMUSCULAR | Status: AC
  Administered 2014-11-27: 09:00:00
  Filled 2014-11-27: qty 10

## 2014-11-27 MED ORDER — FENTANYL CITRATE (PF) 100 MCG/2ML IJ SOLN
INTRAMUSCULAR | Status: AC
  Administered 2014-11-27: 09:00:00
  Filled 2014-11-27: qty 4

## 2014-11-27 NOTE — Progress Notes (Signed)
TEE result TEE performed this morning. 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 Vegetation is small, mobile. No vegetation on the mitral valve. Mild mitral valve regurgitation, no significant aortic valve regurgitation or stenosis Otherwise normal study Small VSD noted.  Would consider consulting infectious disease for guidance for endocarditis Clinical symptoms consistent with endocarditis and given her TEE results, definitive diagnosis of endocarditis

## 2014-11-27 NOTE — Consult Note (Signed)
[facility] Infectious Disease     Reason for Consult: Viridans strep endocarditis   Referring Physician: Andria Rhein Date of Admission:  11/26/2014   Active Problems:   Endocarditis   Gram-positive cocci bacteremia   Bacteremia   VSD (ventricular septal defect)   HPI: Madison Huerta is a 74 y.o. female with hx of breast cancer, small VSD, hypertension, hyperlipidemia admitted 10/3 with 6-7 weeks of fevers, night sweats, and rigors. She has positive blood cultures with viridans strep. TTE was a poor study, TEE shows aortic valve vegetation. She reports that she had a dental procedure 2 months ago. She did take prophylactic ABX.  Approximately 6-7 weeks ago she started developing fevers, then night sweats. General malaise. Seen by primary care 3 times. She is penicillin allergic. Is currently on vancomycin and ceftriaxone.  Complains also of some upper back pain   Past Medical History  Diagnosis Date  . Hyperlipidemia   . Hypertension   . Thyroid disease     Hypothyroidism  . Osteopenia   . History of breast cancer   . Overweight(278.02)   . VSD (ventricular septal defect)     does need SBE prophylaxis (Keflex  3 gm 1 hour pprior and 1.5 gm 6 hours post  . Heart murmur     VSD- echocardiogram - [facility] 10 yrs. ago  . Hypothyroidism   . GERD (gastroesophageal reflux disease)   . Breast cancer ([place])     L breast- 1994- lumpectomy, care for at [facility]   . VSD (ventricular septal defect)   . Cataract    Past Surgical History  Procedure Laterality Date  . Breast lumpectomy  1994    Radiation  . Parathyroidectomy  12/2002  . 2d echo  2004    Normal at [facility]  . Breast implant removal  1994  . Tonsillectomy      As a child  . Tubal ligation      1976  . Mastectomy  01/08/2012    RIGHT  . Mastectomy w/ sentinel node biopsy  01/08/2012    Procedure: MASTECTOMY WITH SENTINEL LYMPH NODE BIOPSY;  Surgeon: Madilyn Hook, DO;  Location: [facility];  Service: General;  Laterality: Right;   right breast mastectomy with sentinel lymph node biopsy    Social History  Substance Use Topics  . Smoking status: Never Smoker   . Smokeless tobacco: Never Used  . Alcohol Use: 0.0 oz/week    0 Standard drinks or equivalent per week     Comment: 1 glass of wine occ   Family History  Problem Relation Age of Onset  . Aneurysm Mother     brain  . Cancer Father     Lung, Liver mets  . Lung cancer Father   . Heart disease Other   . Cancer Cousin     Breast CA  . Breast cancer Cousin   . Breast cancer Maternal Aunt 55    Allergies:  Allergies  Allergen Reactions  . Codeine Other (See Comments)    Reaction:  Severe headaches   . Penicillins Hives and Other (See Comments)    Has patient had a PCN reaction causing immediate rash, facial/tongue/throat swelling, SOB or lightheadedness with hypotension: No Has patient had a PCN reaction causing severe rash involving mucus membranes or skin necrosis: No Has patient had a PCN reaction that required hospitalization No Has patient had a PCN reaction occurring within the last 10 years: No If all of the above answers are "NO", then  may proceed with Cephalosporin use.  . Adhesive [Tape] Rash    Current antibiotics: Antibiotics Given (last 72 hours)    Date/Time Action Medication Dose Rate   11/26/14 2210 Given   cefTRIAXone (ROCEPHIN) 2 g in dextrose 5 % 50 mL IVPB 2 g 100 mL/hr   11/27/14 0236 Given   vancomycin (VANCOCIN) IVPB 750 mg/150 ml premix 750 mg 150 mL/hr      MEDICATIONS: . amLODipine  5 mg Oral Daily  . anastrozole  1 mg Oral Daily  . cefTRIAXone (ROCEPHIN)  IV  2 g Intravenous Q24H  . enoxaparin (LOVENOX) injection  40 mg Subcutaneous Q24H  . fentaNYL      . levothyroxine  75 mcg Oral QAC breakfast  . lidocaine      . midazolam      . rosuvastatin  10 mg Oral Daily  . sodium chloride  3 mL Intravenous Q12H  . vancomycin  750 mg Intravenous Q18H    Review of Systems - 11 systems reviewed and negative per  HPI   OBJECTIVE: Temp:  [97.7 F (36.5 C)-98.5 F (36.9 C)] 97.7 F (36.5 C) (10/04 1404) Pulse Rate:  [68-92] 74 (10/04 1404) Resp:  [12-21] 20 (10/04 1404) BP: (98-162)/(43-80) 118/43 mmHg (10/04 1404) SpO2:  [93 %-99 %] 99 % (10/04 1404) Weight:  [56.246 kg (124 lb)-56.337 kg (124 lb 3.2 oz)] 56.337 kg (124 lb 3.2 oz) (10/03 1757) Physical Exam  Constitutional:  oriented to person, place, and time. appears well-developed and well-nourished. No distress.  HENT: [place]/AT, PERRLA, no scleral icterus Mouth/Throat: Oropharynx is clear and moist. No oropharyngeal exudate.  Cardiovascular: Normal rate, regular rhythm 3/6 sm Pulmonary/Chest: Effort normal and breath sounds normal. No respiratory distress.  has no wheezes.  Neck  supple, no nuchal rigidity Abdominal: Soft. Bowel sounds are normal.  exhibits no distension. There is no tenderness.  Lymphadenopathy: no cervical adenopathy. No axillary adenopathy Neurological: alert and oriented to person, place, and time.  Skin: Skin is warm and dry. No rash noted. No erythema.  Psychiatric: a normal mood and affect.  behavior is normal.  LABS: Results for orders placed or performed during the hospital encounter of 11/26/14 (from the past 48 hour(s))  CBC with Differential/Platelet     Status: Abnormal   Collection Time: 11/26/14  3:53 PM  Result Value Ref Range   WBC 10.5 3.6 - 11.0 K/uL   RBC 3.83 3.80 - 5.20 MIL/uL   Hemoglobin 11.3 (L) 12.0 - 16.0 g/dL   HCT 33.3 (L) 35.0 - 47.0 %   MCV 86.9 80.0 - 100.0 fL   MCH 29.5 26.0 - 34.0 pg   MCHC 33.9 32.0 - 36.0 g/dL   RDW 13.1 11.5 - 14.5 %   Platelets 334 150 - 440 K/uL   Neutrophils Relative % 74 %   Neutro Abs 7.7 (H) 1.4 - 6.5 K/uL   Lymphocytes Relative 16 %   Lymphs Abs 1.7 1.0 - 3.6 K/uL   Monocytes Relative 9 %   Monocytes Absolute 1.0 (H) 0.2 - 0.9 K/uL   Eosinophils Relative 0 %   Eosinophils Absolute 0.0 0 - 0.7 K/uL   Basophils Relative 1 %   Basophils Absolute 0.1 0 -  0.1 K/uL  Comprehensive metabolic panel     Status: Abnormal   Collection Time: 11/26/14  3:53 PM  Result Value Ref Range   Sodium 132 (L) 135 - 145 mmol/L   Potassium 3.6 3.5 - 5.1 mmol/L   Chloride 98 (  L) 101 - 111 mmol/L   CO2 23 22 - 32 mmol/L   Glucose, Bld 105 (H) 65 - 99 mg/dL   BUN 14 6 - 20 mg/dL   Creatinine, Ser 0.77 0.44 - 1.00 mg/dL   Calcium 8.9 8.9 - 10.3 mg/dL   Total Protein 7.6 6.5 - 8.1 g/dL   Albumin 4.1 3.5 - 5.0 g/dL   AST 23 15 - 41 U/L   ALT 15 14 - 54 U/L   Alkaline Phosphatase 72 38 - 126 U/L   Total Bilirubin 0.4 0.3 - 1.2 mg/dL   GFR calc non Af Amer >60 >60 mL/min   GFR calc Af Amer >60 >60 mL/min    Comment: (NOTE) The eGFR has been calculated using the CKD EPI equation. This calculation has not been validated in all clinical situations. eGFR's persistently <60 mL/min signify possible Chronic Kidney Disease.    Anion gap 11 5 - 15  Troponin I     Status: None   Collection Time: 11/26/14  3:53 PM  Result Value Ref Range   Troponin I <0.03 <0.031 ng/mL    Comment:        NO INDICATION OF MYOCARDIAL INJURY.   Basic metabolic panel     Status: Abnormal   Collection Time: 11/27/14  4:01 AM  Result Value Ref Range   Sodium 139 135 - 145 mmol/L   Potassium 3.9 3.5 - 5.1 mmol/L   Chloride 106 101 - 111 mmol/L   CO2 24 22 - 32 mmol/L   Glucose, Bld 101 (H) 65 - 99 mg/dL   BUN 12 6 - 20 mg/dL   Creatinine, Ser 0.71 0.44 - 1.00 mg/dL   Calcium 8.7 (L) 8.9 - 10.3 mg/dL   GFR calc non Af Amer >60 >60 mL/min   GFR calc Af Amer >60 >60 mL/min    Comment: (NOTE) The eGFR has been calculated using the CKD EPI equation. This calculation has not been validated in all clinical situations. eGFR's persistently <60 mL/min signify possible Chronic Kidney Disease.    Anion gap 9 5 - 15  CBC     Status: Abnormal   Collection Time: 11/27/14  4:01 AM  Result Value Ref Range   WBC 7.8 3.6 - 11.0 K/uL   RBC 3.61 (L) 3.80 - 5.20 MIL/uL   Hemoglobin 10.4 (L)  12.0 - 16.0 g/dL   HCT 31.2 (L) 35.0 - 47.0 %   MCV 86.4 80.0 - 100.0 fL   MCH 28.7 26.0 - 34.0 pg   MCHC 33.2 32.0 - 36.0 g/dL   RDW 13.0 11.5 - 14.5 %   Platelets 306 150 - 440 K/uL   No components found for: ESR, C REACTIVE PROTEIN MICRO: Recent Results (from the past 720 hour(s))  Culture, blood (single)     Status: None (Preliminary result)   Collection Time: 11/23/14  9:46 AM  Result Value Ref Range Status   Preliminary Report VIRIDANS STREPTOCOCCUS  Preliminary  Urine culture     Status: None   Collection Time: 11/23/14  9:46 AM  Result Value Ref Range Status   Culture   Preliminary    Multiple bacterial morphotypes present, none predominant. Suggest appropriate recollection if  clinically indicated.    Colony Count 50,000 COLONIES/ML  Final   Organism ID, Bacteria Multiple bacterial morphotypes present, none  Final   Organism ID, Bacteria predominant. Suggest appropriate recollection if   Final   Organism ID, Bacteria clinically indicated.  Final  Culture, blood (  single)     Status: None (Preliminary result)   Collection Time: 11/23/14  9:46 AM  Result Value Ref Range Status   Preliminary Report VIRIDANS STREPTOCOCCUS  Preliminary  Urine culture     Status: None   Collection Time: 11/25/14 10:30 AM  Result Value Ref Range Status   Specimen Description URINE, CLEAN CATCH  Final   Special Requests NONE  Final   Culture MULTIPLE SPECIES PRESENT, SUGGEST RECOLLECTION  Final   Report Status 11/26/2014 FINAL  Final  Blood culture (routine x 2)     Status: None (Preliminary result)   Collection Time: 11/25/14 10:30 AM  Result Value Ref Range Status   Specimen Description BLOOD LEFT ASSIST CONTROL  Final   Special Requests BOTTLES DRAWN AEROBIC AND ANAEROBIC  5CC  Final   Culture  Setup Time   Final    GRAM POSITIVE COCCI IN CHAINS IN BOTH AEROBIC AND ANAEROBIC BOTTLES CRITICAL RESULT CALLED TO, READ BACK BY AND VERIFIED WITH: STACEY PIERCE 11/26/14 1434 JGF    Culture    Final    GRAM POSITIVE COCCI IN CHAINS IN BOTH AEROBIC AND ANAEROBIC BOTTLES IDENTIFICATION TO FOLLOW    Report Status PENDING  Incomplete  Blood culture (routine x 2)     Status: None (Preliminary result)   Collection Time: 11/25/14 10:31 AM  Result Value Ref Range Status   Specimen Description BLOOD RIGHT ASSIST CONTROL  Final   Special Requests BOTTLES DRAWN AEROBIC AND ANAEROBIC  3CC  Final   Culture  Setup Time   Final    GRAM POSITIVE COCCI IN CHAINS IN BOTH AEROBIC AND ANAEROBIC BOTTLES CRITICAL RESULT CALLED TO, READ BACK BY AND VERIFIED WITH: STACEY PIERCE 11/26/14 1434 JGF    Culture   Final    GRAM POSITIVE COCCI IN CHAINS IN BOTH AEROBIC AND ANAEROBIC BOTTLES IDENTIFICATION TO FOLLOW    Report Status PENDING  Incomplete    IMAGING: Dg Chest 2 View  11/13/2014   CLINICAL DATA:  Cough for the past 3-4 weeks.  Low-grade fever.  EXAM: CHEST  2 VIEW  COMPARISON:  01/01/2012.  FINDINGS: Normal sized heart. Clear lungs. The lungs remain hyperexpanded. A right posterior diaphragmatic eventration or hernia is again demonstrated. Bilateral postmastectomy changes with bilateral axillary surgical clips are again demonstrated. Thoracic spine degenerative changes.  IMPRESSION: Stable changes of COPD.  No acute abnormality.   Electronically Signed   By: Claudie Revering M.D.   On: 11/13/2014 12:24   Dg Bone Density  11/12/2014   ADDENDUM REPORT: 11/12/2014 11:53 ADDENDUM: After further review, patient's diagnostic category is LOW BONE MASS by WHO criteria. Fracture risk is INCREASED. FRAX:  Not calculated because of history of bone building therapy. Electronically Signed   By: Franki Cabot M.D.   On: 11/12/2014 11:53  11/12/2014   ADDENDUM REPORT: 11/12/2014 11:19 ADDENDUM: After further review, there has actually been a statistically significant IMPROVEMENT compared to patient's previous exam of 02/22/2013 and also compared to patient's earlier baseline exam of 03/05/2010. Electronically  Signed   By: Franki Cabot M.D.   On: 11/12/2014 11:19  11/12/2014   CLINICAL DATA:  Postmenopausal. No history of pathologic fracture. History of breast cancer status post mastectomy.  EXAM: DUAL X-RAY ABSORPTIOMETRY (DXA) FOR BONE MINERAL DENSITY  FINDINGS: AP LUMBAR SPINE L1 through L3  Bone Mineral Density (BMD):  1.037 g/cm2  Young Adult T-Score:  -1.2  Z-Score:  0.5  LEFT FEMUR NECK  Bone Mineral Density (BMD):  0.852 g/cm2  Young Adult T-Score: -1.3  Z-Score:  0.5  ASSESSMENT: Patient's diagnostic category is NORMAL by WHO Criteria.  FRACTURE RISK: NOT INCREASED  FRAX:  Not calculated because all T-scores are at or above -1.5.  COMPARISON: There has been a statistically significant worsening compared to patient's previous exam of 2014.  Effective therapies are available in the form of bisphosphonates, selective estrogen receptor modulators, biologic agents, and hormone replacement therapy (for women). All patients should ensure an adequate intake of dietary calcium (1200 mg daily) and vitamin D (800 IU daily) unless contraindicated.  All treatment decisions require clinical judgment and consideration of individual patient factors, including patient preferences, co-morbidities, previous drug use, risk factors not captured in the FRAX model (e.g., frailty, falls, vitamin D deficiency, increased bone turnover, interval significant decline in bone density) and possible under- or over-estimation of fracture risk by FRAX.  The National Osteoporosis Foundation recommends that FDA-approved medical therapies be considered in postmenopausal women and men age 6 or older with a:  1. Hip or vertebral (clinical or morphometric) fracture.  2. T-score of -2.5 or lower at the spine or hip.  3. Ten-year fracture probability by FRAX of 3% or greater for hip fracture or 20% or greater for major osteoporotic fracture.  People with diagnosed cases of osteoporosis or at high risk for fracture should have regular bone mineral  density tests. For patients eligible for Medicare, routine testing is allowed once every 2 years. The testing frequency can be increased to one year for patients who have rapidly progressing disease, those who are receiving or discontinuing medical therapy to restore bone mass, or have additional risk factors.  World Pharmacologist [facility]) Criteria:  Normal: T-scores from +1.0 to -1.0  Low Bone Mass (Osteopenia): T-scores between -1.0 and -2.5  Osteoporosis: T-scores -2.5 and below  Comparison to Reference Population:  T-score is the key measure used in the diagnosis of osteoporosis and relative risk determination for fracture. It provides a value for bone mass relative to the mean bone mass of a young adult reference population expressed in terms of standard deviation (SD).  Z-score is the age-matched score showing the patient's values compared to a population matched for age, sex, and race. This is also expressed in terms of standard deviation. The patient may have values that compare favorably to the age-matched values and still be at increased risk for fracture.  Electronically Signed: By: Franki Cabot M.D. On: 11/12/2014 10:44   Mm Screening Breast Tomo Uni L  11/12/2014   CLINICAL DATA:  Screening.  EXAM: DIGITAL SCREENING UNILATERAL LEFT MAMMOGRAM WITH TOMO AND CAD  COMPARISON:  Previous exam(s).  ACR Breast Density Category a: The breast tissue is almost entirely fatty.  FINDINGS: There are no findings suspicious for malignancy. Images were processed with CAD.  IMPRESSION: No mammographic evidence of malignancy. A result letter of this screening mammogram will be mailed directly to the patient.  RECOMMENDATION: Screening mammogram in one year. (Code:SM-B-01Y)  BI-RADS CATEGORY  1: Negative.   Electronically Signed   By: Lovey Newcomer M.D.   On: 11/12/2014 12:54    Assessment:   Madison Huerta is a 74 y.o. 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  Recommendations FU bcx 10/3 are pending Once cx neg place picc Has some T spine pain - check MRI  Will need 4-6 weeks IV abx- selection based on sensitivities - can likley use ceftraixone - she is pcn allergic but is tolerating this.  Thank you  very much for allowing me to participate in the care of this patient. Please call with questions.   David P. Fitzgerald, MD   

## 2014-11-27 NOTE — Progress Notes (Signed)
RN rounding on patient , patient reports she took home tylenol. RN educated pt.on importance of informing RN when she has pain and only taking medication from the hospital. RN told pt she can not take her home medication without approval from her doctor and the pharmacy, pt verbalizes understanding of this. RN asked pt to send medication home and pt verbalized that she would.

## 2014-11-27 NOTE — Progress Notes (Signed)
[place] [facility] [place] NAME: Madison Huerta    MR#:  [id number]  DATE OF BIRTH:  07-24-1940  SUBJECTIVE: An 74 year old female patient admitted for fever and chills and positive blood culture results. Started on IV Rocephin, had a TEE which showed aortic valve vegetations.  Seen at the bedside today, patient denies chest pain, shortness of breath, fever.   CHIEF COMPLAINT:   Chief Complaint  Patient presents with  . Blood Infection    REVIEW OF SYSTEMS:    Review of Systems  Constitutional: Negative for fever and chills.  HENT: Negative for hearing loss.   Eyes: Negative for blurred vision, double vision and photophobia.  Respiratory: Negative for cough, hemoptysis and shortness of breath.   Cardiovascular: Negative for palpitations, orthopnea and leg swelling.  Gastrointestinal: Negative for vomiting, abdominal pain and diarrhea.  Genitourinary: Negative for dysuria and urgency.  Musculoskeletal: Negative for myalgias and neck pain.  Skin: Negative for rash.  Neurological: Negative for dizziness, focal weakness, seizures, weakness and headaches.  Psychiatric/Behavioral: Negative for memory loss. The patient does not have insomnia.     Nutrition:  Tolerating Diet: Tolerating PT:      DRUG ALLERGIES:   Allergies  Allergen Reactions  . Codeine Other (See Comments)    Reaction:  Severe headaches   . Penicillins Hives and Other (See Comments)    Has patient had a PCN reaction causing immediate rash, facial/tongue/throat swelling, SOB or lightheadedness with hypotension: No Has patient had a PCN reaction causing severe rash involving mucus membranes or skin necrosis: No Has patient had a PCN reaction that required hospitalization No Has patient had a PCN reaction occurring within the last 10 years: No If all of the above answers are "NO", then may proceed with Cephalosporin use.  . Adhesive [Tape] Rash    VITALS:  Blood  pressure 127/49, pulse 70, temperature 98.3 F (36.8 C), temperature source Oral, resp. rate 18, height 5' (1.524 m), weight 56.337 kg (124 lb 3.2 oz), SpO2 95 %.  PHYSICAL EXAMINATION:   Physical Exam  GENERAL:  74 y.o.-year-old patient lying in the bed with no acute distress.  EYES: Pupils equal, round, reactive to light and accommodation. No scleral icterus. Extraocular muscles intact.  HEENT: Head atraumatic, normocephalic. Oropharynx and nasopharynx clear.  NECK:  Supple, no jugular venous distention. No thyroid enlargement, no tenderness.  LUNGS: Normal breath sounds bilaterally, no wheezing, rales,rhonchi or crepitation. No use of accessory muscles of respiration.  CARDIOVASCULAR: S1, S2 normal. No murmurs, rubs, or gallops.  ABDOMEN: Soft, nontender, nondistended. Bowel sounds present. No organomegaly or mass.  EXTREMITIES: No pedal edema, cyanosis, or clubbing.  NEUROLOGIC: Cranial nerves II through XII are intact. Muscle strength 5/5 in all extremities. Sensation intact. Gait not checked.  PSYCHIATRIC: The patient is alert and oriented x 3.  SKIN: No obvious rash, lesion, or ulcer.    LABORATORY PANEL:   CBC  Recent Labs Lab 11/27/14 0401  WBC 7.8  HGB 10.4*  HCT 31.2*  PLT 306   ------------------------------------------------------------------------------------------------------------------  Chemistries   Recent Labs Lab 11/26/14 1553 11/27/14 0401  NA 132* 139  K 3.6 3.9  CL 98* 106  CO2 23 24  GLUCOSE 105* 101*  BUN 14 12  CREATININE 0.77 0.71  CALCIUM 8.9 8.7*  AST 23  --   ALT 15  --   ALKPHOS 72  --   BILITOT 0.4  --    ------------------------------------------------------------------------------------------------------------------  Cardiac Enzymes  Recent Labs Lab 11/26/14 1553  TROPONINI <0.03   ------------------------------------------------------------------------------------------------------------------  RADIOLOGY:  No results  found.   ASSESSMENT AND PLAN:   Active Problems:   Endocarditis   #1 bacteremia with positive blood cultures secondary to infective endocarditis: Patient is on Rocephin, had a TEE this morning showed vegetations and the aortic valve. No vegetations in the mitral valve. Patient needs ID consult for  guidance of endocarditis treatment., Follow blood cultures and sensitivity results. Continue IV Rocephin, vancomycin..  #2. History of fall hypertension: Controlled, History of for hypothyroidism continue home medications #4 history of breast cancer: Patient takes  anastrozole daily.  All the records are reviewed and case discussed with Care Management/Social Workerr. Management plans discussed with the patient, family and they are in agreement.  CODE STATUS: Full  TOTAL TIME TAKING CARE OF THIS PATIENT: 35 minutes.   POSSIBLE D/C IN 1-2DAYS, DEPENDING ON CLINICAL CONDITION.   Epifanio Lesches M.D on 11/27/2014 at 12:04 PM  Between 7am to 6pm - Pager - [phone]  After 6pm go to [email or web address] - password [email or web address] [facility]  [facility]  Office  [phone]  CC: Primary care physician; Loura Pardon, MD

## 2014-11-27 NOTE — Consult Note (Addendum)
Cardiology Consultation Note  Patient ID: Madison Huerta, MRN: [id number], DOB/AGE: 10-07-1940 74 y.o. Admit date: 11/26/2014   Date of Consult: 11/27/2014 Primary Physician: Loura Pardon, MD Primary Cardiologist:   Chief Complaint: [place], night sweats, rigors Reason for Consult: Endocarditis  HPI: 74 y.o. female with h/o breast cancer with recurrence several years ago, small VSD, hypertension, hyperlipidemia presenting with 6-7 weeks of fevers, night sweats, and finally rigors in the past day or so, positive blood cultures. Cardiology was consulted for Endocarditis.  She reports that she had a dental procedure 2 months ago. She did take prophylactic ABX.  Approximately 6-7 weeks ago she started developing fevers, then night sweats. General malaise. Seen by primary care 3 times. Recently had blood cultures, testing positive. Sensitivities pending. Secondary to worsening symptoms, she presented to the emergency room. Started on ABX.  Echocardiogram a challenging images, unable to exclude endocarditis. She will be scheduled for TEE today   Past Medical History  Diagnosis Date  . Hyperlipidemia   . Hypertension   . Thyroid disease     Hypothyroidism  . Osteopenia   . History of breast cancer   . Overweight(278.02)   . VSD (ventricular septal defect)     does need SBE prophylaxis (Keflex  3 gm 1 hour pprior and 1.5 gm 6 hours post  . Heart murmur     VSD- echocardiogram - [facility] 10 yrs. ago  . Hypothyroidism   . GERD (gastroesophageal reflux disease)   . Breast cancer ([place])     L breast- 1994- lumpectomy, care for at [facility]   . VSD (ventricular septal defect)   . Cataract       Most Recent Cardiac Studies: Echo (TTE) essentially normal, unable to exclude endocarditis   Surgical History:  Past Surgical History  Procedure Laterality Date  . Breast lumpectomy  1994    Radiation  . Parathyroidectomy  12/2002  . 2d echo  2004    Normal at [facility]  . Breast implant removal  1994    . Tonsillectomy      As a child  . Tubal ligation      1976  . Mastectomy  01/08/2012    RIGHT  . Mastectomy w/ sentinel node biopsy  01/08/2012    Procedure: MASTECTOMY WITH SENTINEL LYMPH NODE BIOPSY;  Surgeon: Madilyn Hook, DO;  Location: [place];  Service: General;  Laterality: Right;  right breast mastectomy with sentinel lymph node biopsy      Home Meds: Prior to Admission medications   Medication Sig Start Date End Date Taking? Authorizing Provider  acetaminophen (TYLENOL) 500 MG tablet Take 500 mg by mouth every 4 (four) hours as needed for fever.   Yes Historical Provider, MD  alendronate (FOSAMAX) 70 MG tablet take 1 tablet by mouth every week on an empty stomach with 6-8 oz of water. No food / meds for 30 min. Remain Upright Patient taking differently: Take 70 mg by mouth once a week. Pt takes on Sunday. 02/21/14  Yes Abner Greenspan, MD  amLODipine (NORVASC) 5 MG tablet Take 1 tablet (5 mg total) by mouth daily. 02/21/14  Yes Abner Greenspan, MD  anastrozole (ARIMIDEX) 1 MG tablet Take 1 tablet (1 mg total) by mouth daily. 05/15/13  Yes Consuela Mimes, MD  levothyroxine (SYNTHROID, LEVOTHROID) 75 MCG tablet Take 1 tablet (75 mcg total) by mouth daily before breakfast. 02/21/14  Yes Abner Greenspan, MD  rosuvastatin (CRESTOR) 10 MG tablet Take 1 tablet (10 mg  total) by mouth daily. 02/21/14  Yes Abner Greenspan, MD  benzonatate (TESSALON) 200 MG capsule Take 1 capsule (200 mg total) by mouth 3 (three) times daily as needed for cough (swallow whole, do not bite pill). Patient not taking: Reported on 11/26/2014 11/13/14   Abner Greenspan, MD    Inpatient Medications:  . amLODipine  5 mg Oral Daily  . anastrozole  1 mg Oral Daily  . cefTRIAXone (ROCEPHIN)  IV  2 g Intravenous Q24H  . enoxaparin (LOVENOX) injection  40 mg Subcutaneous Q24H  . levothyroxine  75 mcg Oral QAC breakfast  . rosuvastatin  10 mg Oral Daily  . sodium chloride  3 mL Intravenous Q12H  . vancomycin  750 mg Intravenous  Q18H   . sodium chloride      Allergies:  Allergies  Allergen Reactions  . Codeine Other (See Comments)    Reaction:  Severe headaches   . Penicillins Hives and Other (See Comments)    Has patient had a PCN reaction causing immediate rash, facial/tongue/throat swelling, SOB or lightheadedness with hypotension: No Has patient had a PCN reaction causing severe rash involving mucus membranes or skin necrosis: No Has patient had a PCN reaction that required hospitalization No Has patient had a PCN reaction occurring within the last 10 years: No If all of the above answers are "NO", then may proceed with Cephalosporin use.  . Adhesive [Tape] Rash    Social History   Social History  . Marital Status: Married    Spouse Name: N/A  . Number of Children: 2  . Years of Education: N/A   Occupational History  . Medical illustrator    Social History Main Topics  . Smoking status: Never Smoker   . Smokeless tobacco: Never Used  . Alcohol Use: 0.0 oz/week    0 Standard drinks or equivalent per week     Comment: 1 glass of wine occ  . Drug Use: No  . Sexual Activity: Yes   Other Topics Concern  . Not on file   Social History Narrative     Family History  Problem Relation Age of Onset  . Aneurysm Mother     brain  . Cancer Father     Lung, Liver mets  . Lung cancer Father   . Heart disease Other   . Cancer Cousin     Breast CA  . Breast cancer Cousin   . Breast cancer Maternal Aunt 74     Review of Systems: Review of Systems  Constitutional: Positive for fever and malaise/fatigue.  Respiratory: Negative.   Cardiovascular: Negative.   Gastrointestinal: Negative.   Musculoskeletal: Negative.   Neurological: Positive for weakness.  Psychiatric/Behavioral: Negative.   All other systems reviewed and are negative.   Labs:  Recent Labs  11/26/14 1553  TROPONINI <0.03   Lab Results  Component Value Date   WBC 7.8 11/27/2014   HGB 10.4* 11/27/2014   HCT 31.2*  11/27/2014   MCV 86.4 11/27/2014   PLT 306 11/27/2014    Recent Labs Lab 11/26/14 1553 11/27/14 0401  NA 132* 139  K 3.6 3.9  CL 98* 106  CO2 23 24  BUN 14 12  CREATININE 0.77 0.71  CALCIUM 8.9 8.7*  PROT 7.6  --   BILITOT 0.4  --   ALKPHOS 72  --   ALT 15  --   AST 23  --   GLUCOSE 105* 101*   Lab Results  Component Value Date  CHOL 186 02/14/2014   HDL 65.50 02/14/2014   LDLCALC 100* 02/14/2014   TRIG 105.0 02/14/2014   No results found for: DDIMER  Radiology/Studies:  Dg Chest 2 View  11/13/2014   CLINICAL DATA:  Cough for the past 3-4 weeks.  Low-grade fever.  EXAM: CHEST  2 VIEW  COMPARISON:  01/01/2012.  FINDINGS: Normal sized heart. Clear lungs. The lungs remain hyperexpanded. A right posterior diaphragmatic eventration or hernia is again demonstrated. Bilateral postmastectomy changes with bilateral axillary surgical clips are again demonstrated. Thoracic spine degenerative changes.  IMPRESSION: Stable changes of COPD.  No acute abnormality.   Electronically Signed   By: Claudie Revering M.D.   On: 11/13/2014 12:24   Dg Bone Density  11/12/2014   ADDENDUM REPORT: 11/12/2014 11:53 ADDENDUM: After further review, patient's diagnostic category is LOW BONE MASS by WHO criteria. Fracture risk is INCREASED. FRAX:  Not calculated because of history of bone building therapy. Electronically Signed   By: Franki Cabot M.D.   On: 11/12/2014 11:53  11/12/2014   ADDENDUM REPORT: 11/12/2014 11:19 ADDENDUM: After further review, there has actually been a statistically significant IMPROVEMENT compared to patient's previous exam of 02/22/2013 and also compared to patient's earlier baseline exam of 03/05/2010. Electronically Signed   By: Franki Cabot M.D.   On: 11/12/2014 11:19  11/12/2014   CLINICAL DATA:  Postmenopausal. No history of pathologic fracture. History of breast cancer status post mastectomy.  EXAM: DUAL X-RAY ABSORPTIOMETRY (DXA) FOR BONE MINERAL DENSITY  FINDINGS: AP LUMBAR  SPINE L1 through L3  Bone Mineral Density (BMD):  1.037 g/cm2  Young Adult T-Score:  -1.2  Z-Score:  0.5  LEFT FEMUR NECK  Bone Mineral Density (BMD):  0.852 g/cm2  Young Adult T-Score: -1.3  Z-Score:  0.5  ASSESSMENT: Patient's diagnostic category is NORMAL by WHO Criteria.  FRACTURE RISK: NOT INCREASED  FRAX:  Not calculated because all T-scores are at or above -1.5.  COMPARISON: There has been a statistically significant worsening compared to patient's previous exam of 2014.  Effective therapies are available in the form of bisphosphonates, selective estrogen receptor modulators, biologic agents, and hormone replacement therapy (for women). All patients should ensure an adequate intake of dietary calcium (1200 mg daily) and vitamin D (800 IU daily) unless contraindicated.  All treatment decisions require clinical judgment and consideration of individual patient factors, including patient preferences, co-morbidities, previous drug use, risk factors not captured in the FRAX model (e.g., frailty, falls, vitamin D deficiency, increased bone turnover, interval significant decline in bone density) and possible under- or over-estimation of fracture risk by FRAX.  The National Osteoporosis Foundation recommends that FDA-approved medical therapies be considered in postmenopausal women and men age 60 or older with a:  1. Hip or vertebral (clinical or morphometric) fracture.  2. T-score of -2.5 or lower at the spine or hip.  3. Ten-year fracture probability by FRAX of 3% or greater for hip fracture or 20% or greater for major osteoporotic fracture.  People with diagnosed cases of osteoporosis or at high risk for fracture should have regular bone mineral density tests. For patients eligible for Medicare, routine testing is allowed once every 2 years. The testing frequency can be increased to one year for patients who have rapidly progressing disease, those who are receiving or discontinuing medical therapy to restore bone  mass, or have additional risk factors.  World Health Organization [facility]) Criteria:  Normal: T-scores from +1.0 to -1.0  Low Bone Mass (Osteopenia): T-scores between -1.0 and -  2.5  Osteoporosis: T-scores -2.5 and below  Comparison to Reference Population:  T-score is the key measure used in the diagnosis of osteoporosis and relative risk determination for fracture. It provides a value for bone mass relative to the mean bone mass of a young adult reference population expressed in terms of standard deviation (SD).  Z-score is the age-matched score showing the patient's values compared to a population matched for age, sex, and race. This is also expressed in terms of standard deviation. The patient may have values that compare favorably to the age-matched values and still be at increased risk for fracture.  Electronically Signed: By: Franki Cabot M.D. On: 11/12/2014 10:44   Mm Screening Breast Tomo Uni L  11/12/2014   CLINICAL DATA:  Screening.  EXAM: DIGITAL SCREENING UNILATERAL LEFT MAMMOGRAM WITH TOMO AND CAD  COMPARISON:  Previous exam(s).  ACR Breast Density Category a: The breast tissue is almost entirely fatty.  FINDINGS: There are no findings suspicious for malignancy. Images were processed with CAD.  IMPRESSION: No mammographic evidence of malignancy. A result letter of this screening mammogram will be mailed directly to the patient.  RECOMMENDATION: Screening mammogram in one year. (Code:SM-B-01Y)  BI-RADS CATEGORY  1: Negative.   Electronically Signed   By: Lovey Newcomer M.D.   On: 11/12/2014 12:54    EKG: EKG shows normal sinus rhythm with no significant ST or T-wave changes.  Weights: Filed Weights   11/26/14 1620 11/26/14 1757  Weight: 124 lb (56.246 kg) 124 lb 3.2 oz (56.337 kg)     Physical Exam: Sinus rhythm Blood pressure 119/57, pulse 68, temperature 98.1 F (36.7 C), temperature source Oral, resp. rate 18, height 5' (1.524 m), weight 124 lb 3.2 oz (56.337 kg), SpO2 96 %. Body mass  index is 24.26 kg/(m^2). General: Well developed, well nourished, in no acute distress. Head: Normocephalic, atraumatic, sclera non-icteric, no xanthomas, nares are without discharge.  Neck: Negative for carotid bruits. JVD not elevated. Lungs: Clear bilaterally to auscultation without wheezes, rales, or rhonchi. Breathing is unlabored. Heart: RRR with S1 S2. No murmurs, rubs, or gallops appreciated. Abdomen: Soft, non-tender, non-distended with normoactive bowel sounds. No hepatomegaly. No rebound/guarding. No obvious abdominal masses. Msk:  Strength and tone appear normal for age. Extremities: No clubbing or cyanosis. No edema.  Distal pedal pulses are 2+ and equal bilaterally. Neuro: Alert and oriented X 3. No facial asymmetry. No focal deficit. Moves all extremities spontaneously. Psych:  Responds to questions appropriately with a normal affect.    Assessment and Plan:   74 y.o. female with h/o breast cancer with recurrence several years ago, small VSD, hypertension, hyperlipidemia presenting with 6-7 weeks of fevers, night sweats, and finally rigors in the past day or so, positive blood cultures. Cardiology was consulted for Endocarditis.   1) Endocarditis bacteremia, positive blood cultures. 6-7 weeks of symptoms, fever, night sweats, recently with rigors. Out of concern for endocarditis, we will schedule TEE this morning. Risk and benefit of the procedure discussed with her. She would like to have this done ASAP so that she can go home once bacterial sensitivities are available. --If positive for endocarditis, would consider consulting infectious disease  2) breast cancer She reports this is in remission Breast cancer 20 years ago, again 3 years ago  3) hypertension Would continue amlodipine  4) Hyperlipidemia  Continue Crestor    5) Hypothyroid  Would continue thyroid supplement as she takes at home   Signed, Esmond Plants, MD [facility] 11/27/2014, 8:42  AM

## 2014-11-27 NOTE — Progress Notes (Signed)
*  PRELIMINARY RESULTS* Echocardiogram Echocardiogram Transesophageal has been performed.  Madison Huerta 11/27/2014, 10:06 AM

## 2014-11-28 LAB — CULTURE, BLOOD (ROUTINE X 2)

## 2014-11-28 LAB — VANCOMYCIN, TROUGH: Vancomycin Tr: 10 ug/mL (ref 10–20)

## 2014-11-28 LAB — CULTURE, BLOOD (SINGLE)

## 2014-11-28 NOTE — Progress Notes (Signed)
Patient ID: Madison Huerta, female   DOB: 1940/08/11, 74 y.o.   MRN: [id number] [facility] PROGRESS NOTE  PCP: Loura Pardon, MD  HPI/Subjective: Patient feeling a little bit better but not much. She had a night sweat last night. Coughing is a little bit less.  Objective: Filed Vitals:   11/28/14 1110  BP: 118/48  Pulse: 72  Temp: 98.1 F (36.7 C)  Resp: 20    Intake/Output Summary (Last 24 hours) at 11/28/14 1249 Last data filed at 11/28/14 0800  Gross per 24 hour  Intake    530 ml  Output      0 ml  Net    530 ml   Filed Weights   11/26/14 1620 11/26/14 1757  Weight: 56.246 kg (124 lb) 56.337 kg (124 lb 3.2 oz)    ROS: Review of Systems  Constitutional: Positive for diaphoresis. Negative for fever and chills.  Eyes: Negative for blurred vision.  Respiratory: Positive for cough and sputum production. Negative for shortness of breath.   Cardiovascular: Negative for chest pain.  Gastrointestinal: Negative for nausea, vomiting, abdominal pain, diarrhea and constipation.  Genitourinary: Negative for dysuria.  Musculoskeletal: Negative for joint pain.  Neurological: Negative for dizziness and headaches.   Exam: Physical Exam  Constitutional: She is oriented to person, place, and time.  HENT:  Nose: No mucosal edema.  Mouth/Throat: No oropharyngeal exudate or posterior oropharyngeal edema.  Eyes: Conjunctivae, EOM and lids are normal. Pupils are equal, round, and reactive to light.  Neck: No JVD present. Carotid bruit is not present. No edema present. No thyroid mass and no thyromegaly present.  Cardiovascular: S1 normal and S2 normal.  Exam reveals no gallop.   Murmur heard.  Systolic murmur is present with a grade of 4/6  Pulses:      Dorsalis pedis pulses are 2+ on the right side, and 2+ on the left side.  Respiratory: No respiratory distress. She has no wheezes. She has no rhonchi. She has no rales.  GI: Soft. Bowel sounds are normal. There is no  tenderness.  Musculoskeletal:       Right ankle: She exhibits no swelling.       Left ankle: She exhibits no swelling.  Lymphadenopathy:    She has no cervical adenopathy.  Neurological: She is alert and oriented to person, place, and time. No cranial nerve deficit.  Skin: Skin is warm. No rash noted. Nails show no clubbing.  Psychiatric: She has a normal mood and affect.    Data Reviewed: Basic Metabolic Panel:  Recent Labs Lab 11/25/14 1030 11/26/14 1553 11/27/14 0401  NA 136 132* 139  K 4.1 3.6 3.9  CL 102 98* 106  CO2 24 23 24   GLUCOSE 105* 105* 101*  BUN 13 14 12   CREATININE 0.91 0.77 0.71  CALCIUM 9.2 8.9 8.7*   Liver Function Tests:  Recent Labs Lab 11/26/14 1553  AST 23  ALT 15  ALKPHOS 72  BILITOT 0.4  PROT 7.6  ALBUMIN 4.1   CBC:  Recent Labs Lab 11/23/14 0946 11/25/14 1030 11/26/14 1553 11/27/14 0401  WBC 10.2 10.8 10.5 7.8  NEUTROABS 8.3* 8.4* 7.7*  --   HGB 11.5* 11.3* 11.3* 10.4*  HCT 34.5* 33.3* 33.3* 31.2*  MCV 88.1 86.7 86.9 86.4  PLT 323.0 306 334 306   Cardiac Enzymes:  Recent Labs Lab 11/26/14 1553  TROPONINI <0.03     Recent Results (from the past 240 hour(s))  Culture, blood (single)  Status: None   Collection Time: 11/23/14  9:46 AM  Result Value Ref Range Status   Organism ID, Bacteria VIRIDANS STREPTOCOCCUS  Final    Comment: Susceptibilities performed on previous culture within the last 5 days. Gram Stain Report Called to,Read Back By and Verified With: Lisbeth Ply RN @ 2:10 AM 11/25/14 BY THOMI   Urine culture     Status: None   Collection Time: 11/23/14  9:46 AM  Result Value Ref Range Status   Culture   Preliminary    Multiple bacterial morphotypes present, none predominant. Suggest appropriate recollection if  clinically indicated.    Colony Count 50,000 COLONIES/ML  Final   Organism ID, Bacteria Multiple bacterial morphotypes present, none  Final   Organism ID, Bacteria predominant. Suggest  appropriate recollection if   Final   Organism ID, Bacteria clinically indicated.  Final  Culture, blood (single)     Status: None (Preliminary result)   Collection Time: 11/23/14  9:46 AM  Result Value Ref Range Status   Preliminary Report VIRIDANS STREPTOCOCCUS  Preliminary  Urine culture     Status: None   Collection Time: 11/25/14 10:30 AM  Result Value Ref Range Status   Specimen Description URINE, CLEAN CATCH  Final   Special Requests NONE  Final   Culture MULTIPLE SPECIES PRESENT, SUGGEST RECOLLECTION  Final   Report Status 11/26/2014 FINAL  Final  Blood culture (routine x 2)     Status: None   Collection Time: 11/25/14 10:30 AM  Result Value Ref Range Status   Specimen Description BLOOD LEFT ASSIST CONTROL  Final   Special Requests BOTTLES DRAWN AEROBIC AND ANAEROBIC  5CC  Final   Culture  Setup Time   Final    GRAM POSITIVE COCCI IN CHAINS IN BOTH AEROBIC AND ANAEROBIC BOTTLES CRITICAL RESULT CALLED TO, READ BACK BY AND VERIFIED WITH: STACEY PIERCE 11/26/14 1434 JGF    Culture   Final    STREPTOCOCCUS MUTANS IN BOTH AEROBIC AND ANAEROBIC BOTTLES REFER TO OTHER SET FOR SUSCEPTIBILITIES    Report Status 11/28/2014 FINAL  Final  Blood culture (routine x 2)     Status: None   Collection Time: 11/25/14 10:31 AM  Result Value Ref Range Status   Specimen Description BLOOD RIGHT ASSIST CONTROL  Final   Special Requests BOTTLES DRAWN AEROBIC AND ANAEROBIC  3CC  Final   Culture  Setup Time   Final    GRAM POSITIVE COCCI IN CHAINS IN BOTH AEROBIC AND ANAEROBIC BOTTLES CRITICAL RESULT CALLED TO, READ BACK BY AND VERIFIED WITH: STACEY PIERCE 11/26/14 1434 JGF    Culture   Final    STREPTOCOCCUS MUTANS IN BOTH AEROBIC AND ANAEROBIC BOTTLES    Report Status 11/28/2014 FINAL  Final   Organism ID, Bacteria STREPTOCOCCUS MUTANS  Final      Susceptibility   Streptococcus mutans - MIC*    ERYTHROMYCIN Value in next row Resistant      RESISTANT>=8    VANCOMYCIN Value in next row  Sensitive      SENSITIVE1    CLINDAMYCIN Value in next row Resistant      RESISTANT>=1    AMPICILLIN Value in next row Sensitive      SENSITIVE<=0.25    LEVOFLOXACIN Value in next row Sensitive      SENSITIVE1    * STREPTOCOCCUS MUTANS  Culture, blood (routine x 2)     Status: None (Preliminary result)   Collection Time: 11/26/14  3:53 PM  Result Value Ref Range  Status   Specimen Description BLOOD LEFT WRIST  Final   Special Requests BOTTLES DRAWN AEROBIC AND ANAEROBIC 6CC  Final   Culture  Setup Time   Final    GRAM POSITIVE COCCI IN BOTH AEROBIC AND ANAEROBIC BOTTLES CRITICAL RESULT CALLED TO, READ BACK BY AND VERIFIED WITH: BROOKE ROBERTSON 11/27/14 MLZ     Culture   Final    GRAM POSITIVE COCCI IN BOTH AEROBIC AND ANAEROBIC BOTTLES IDENTIFICATION TO FOLLOW    Report Status PENDING  Incomplete  Culture, blood (routine x 2)     Status: None (Preliminary result)   Collection Time: 11/26/14  9:32 PM  Result Value Ref Range Status   Specimen Description BLOOD RIGHT ANTECUBITAL  Final   Special Requests BOTTLES DRAWN AEROBIC AND ANAEROBIC 10ML  Final   Culture NO GROWTH 2 DAYS  Final   Report Status PENDING  Incomplete     Studies: Mr Thoracic Spine Wo Contrast  11/27/2014   CLINICAL DATA:  74 year old female with fever, chills, diaphoresis, thoracic spine pain. Viridans strep positive blood cultures and endocarditis with aortic valve vegetation.  EXAM: MRI THORACIC SPINE WITHOUT CONTRAST  TECHNIQUE: Multiplanar, multisequence MR imaging of the thoracic spine was performed. No intravenous contrast was administered.  COMPARISON:  Chest radiographs 10/2014 and earlier.  FINDINGS: Limited sagittal imaging of the cervical spine is remarkable for lower cervical disc degeneration with disc space loss, and mild retrolisthesis at C5-C6. Cervical spine marrow signal appears normal.  Preserved thoracic vertebral height and alignment. Mildly heterogeneous thoracic bone marrow signal, but no  marrow edema or evidence of acute osseous abnormality. Thoracic intervertebral disc signal and morphology is normal for age throughout. No inflammatory signal within the thoracic or visualized upper lumbar discs.  No thoracic spinal stenosis. Spinal cord signal is within normal limits at all visualized levels. Conus medullaris appears normal at T12-L1.  Visualized thorax negative for pericardial or pleural effusion. There are T2 hyperintense lesions in the liver dome measuring up to 14 mm. These are circumscribed and probably are benign cysts. Similar probable benign left renal parapelvic cyst. Small gastric hiatal hernia suspected.  Posterior paraspinal soft tissues appear normal.  IMPRESSION: 1. Negative for age thoracic spine. No acute or inflammatory finding identified. 2. Probable benign cysts in the liver and left kidney.   Electronically Signed   By: Genevie Ann M.D.   On: 11/27/2014 18:18    Scheduled Meds: . amLODipine  5 mg Oral Daily  . anastrozole  1 mg Oral Daily  . cefTRIAXone (ROCEPHIN)  IV  2 g Intravenous Q24H  . enoxaparin (LOVENOX) injection  40 mg Subcutaneous Q24H  . levothyroxine  75 mcg Oral QAC breakfast  . rosuvastatin  10 mg Oral Daily  . vancomycin  750 mg Intravenous Q18H    Assessment/Plan:  1. Acute bacterial endocarditis with bacteremia. Outpatient blood culture grew strep viridans. Inpatient culture growing strep mutans. Patient still on IV vancomycin and Rocephin. I will reorder blood cultures for this afternoon. Awaiting for blood cultures to clear prior to placing PICC line and outpatient antibiotics. Infectious disease specialist to adjust antibiotics. 2. Hypothyroidism unspecified continue levothyroxine 3. Hyperlipidemia unspecified continue Crestor 4. History of breast cancer on anastrozole  Code Status:     Code Status Orders        Start     Ordered   11/26/14 1655  Full code   Continuous     11/26/14 1657     Family Communication: Husband and  daughter  at the bedside  Disposition Plan: Potential home with IV antibiotics, earliest possible date would be Friday or potentially Saturday  Consultants:  Cardiology  infectious disease  Procedures:  TEE   Antibiotics:   Rocephin    vancomycin  Time spent:  25 minutes   Loletha Grayer  [facility]

## 2014-11-28 NOTE — Care Management Note (Signed)
Case Management Note  Patient Details  Name: Madison Huerta MRN: [id number] Date of Birth: 1940/10/05  Subjective/Objective:                  Met with patient to discuss discharge planning. Her PCP is Dr. Ola Spurr per patient and she states she will need 6 weeks of IV ABX in the home. She lives with her husband and states that her husband is capable of providing assistance with IV as she is a breast cancer survivor and he cared for her at that time. She is independent with mobility.   Action/Plan: List of home health agencies provided. Referral called to [facility] and HHRN. RNCM will continue to follow.   Expected Discharge Date:                  Expected Discharge Plan:     In-House Referral:     Discharge planning Services  CM Consult  Post Acute Care Choice:  Home Health Choice offered to:  Patient  DME Arranged:   (IV ABX) DME Agency:  [place]:  RN [facility] Agency:  [place]  Status of Service:  In process, will continue to follow  Medicare Important Message Given:  Yes-second notification given Date Medicare IM Given:    Medicare IM give by:    Date Additional Medicare IM Given:    Additional Medicare Important Message give by:     If discussed at [place] of Stay Meetings, dates discussed:    Additional Comments:  Marshell Garfinkel, RN 11/28/2014, 3:37 PM

## 2014-11-28 NOTE — Progress Notes (Signed)
[place] INFECTIOUS DISEASE PROGRESS NOTE Date of Admission:  11/26/2014     ID: Madison Huerta is a 74 y.o. female with  Viridans strep aortic valve endocarditis  Active Problems:   Endocarditis   Gram-positive cocci bacteremia   Bacteremia   VSD (ventricular septal defect)   Subjective: No fevers, tolerating abx  ROS  Eleven systems are reviewed and negative except per hpi  Medications:  Antibiotics Given (last 72 hours)    Date/Time Action Medication Dose Rate   11/26/14 2210 Given   cefTRIAXone (ROCEPHIN) 2 g in dextrose 5 % 50 mL IVPB 2 g 100 mL/hr   11/27/14 0236 Given   vancomycin (VANCOCIN) IVPB 750 mg/150 ml premix 750 mg 150 mL/hr   11/27/14 1658 Given   cefTRIAXone (ROCEPHIN) 2 g in dextrose 5 % 50 mL IVPB 2 g 100 mL/hr   11/27/14 2050 Given   vancomycin (VANCOCIN) IVPB 750 mg/150 ml premix 750 mg 150 mL/hr     . amLODipine  5 mg Oral Daily  . anastrozole  1 mg Oral Daily  . cefTRIAXone (ROCEPHIN)  IV  2 g Intravenous Q24H  . enoxaparin (LOVENOX) injection  40 mg Subcutaneous Q24H  . levothyroxine  75 mcg Oral QAC breakfast  . rosuvastatin  10 mg Oral Daily  . vancomycin  750 mg Intravenous Q18H    Objective: Vital signs in last 24 hours: Temp:  [97.9 F (36.6 C)-98.1 F (36.7 C)] 98.1 F (36.7 C) (10/05 1110) Pulse Rate:  [69-82] 72 (10/05 1110) Resp:  [16-20] 20 (10/05 1110) BP: (118-144)/(45-55) 118/48 mmHg (10/05 1110) SpO2:  [95 %-98 %] 95 % (10/05 1110) Constitutional: oriented to person, place, and time. appears well-developed and well-nourished. No distress.  HENT: [place]/AT, PERRLA, no scleral icterus Mouth/Throat: Oropharynx is clear and moist. No oropharyngeal exudate.  Cardiovascular: Normal rate, regular rhythm 3/6 sm Pulmonary/Chest: Effort normal and breath sounds normal. No respiratory distress. has no wheezes.  Neck supple, no nuchal rigidity Abdominal: Soft. Bowel sounds are normal. exhibits no distension. There is no  tenderness.  Lymphadenopathy: no cervical adenopathy. No axillary adenopathy Neurological: alert and oriented to person, place, and time.  Skin: Skin is warm and dry. No rash noted. No erythema.  Psychiatric: a normal mood and affect. behavior is normal. Lab Results  Recent Labs  11/26/14 1553 11/27/14 0401  WBC 10.5 7.8  HGB 11.3* 10.4*  HCT 33.3* 31.2*  NA 132* 139  K 3.6 3.9  CL 98* 106  CO2 23 24  BUN 14 12  CREATININE 0.77 0.71    Microbiology: Results for orders placed or performed during the hospital encounter of 11/26/14  Culture, blood (routine x 2)     Status: None (Preliminary result)   Collection Time: 11/26/14  3:53 PM  Result Value Ref Range Status   Specimen Description BLOOD LEFT WRIST  Final   Special Requests BOTTLES DRAWN AEROBIC AND ANAEROBIC 6CC  Final   Culture  Setup Time   Final    GRAM POSITIVE COCCI IN BOTH AEROBIC AND ANAEROBIC BOTTLES CRITICAL RESULT CALLED TO, READ BACK BY AND VERIFIED WITH: BROOKE ROBERTSON 11/27/14 MLZ     Culture   Final    GRAM POSITIVE COCCI IN BOTH AEROBIC AND ANAEROBIC BOTTLES IDENTIFICATION TO FOLLOW    Report Status PENDING  Incomplete  Culture, blood (routine x 2)     Status: None (Preliminary result)   Collection Time: 11/26/14  9:32 PM  Result Value Ref Range Status  Specimen Description BLOOD RIGHT ANTECUBITAL  Final   Special Requests BOTTLES DRAWN AEROBIC AND ANAEROBIC 10ML  Final   Culture NO GROWTH 2 DAYS  Final   Report Status PENDING  Incomplete    Studies/Results: Mr Thoracic Spine Wo Contrast  11/27/2014   CLINICAL DATA:  74 year old female with fever, chills, diaphoresis, thoracic spine pain. Viridans strep positive blood cultures and endocarditis with aortic valve vegetation.  EXAM: MRI THORACIC SPINE WITHOUT CONTRAST  TECHNIQUE: Multiplanar, multisequence MR imaging of the thoracic spine was performed. No intravenous contrast was administered.  COMPARISON:  Chest radiographs 10/2014 and earlier.   FINDINGS: Limited sagittal imaging of the cervical spine is remarkable for lower cervical disc degeneration with disc space loss, and mild retrolisthesis at C5-C6. Cervical spine marrow signal appears normal.  Preserved thoracic vertebral height and alignment. Mildly heterogeneous thoracic bone marrow signal, but no marrow edema or evidence of acute osseous abnormality. Thoracic intervertebral disc signal and morphology is normal for age throughout. No inflammatory signal within the thoracic or visualized upper lumbar discs.  No thoracic spinal stenosis. Spinal cord signal is within normal limits at all visualized levels. Conus medullaris appears normal at T12-L1.  Visualized thorax negative for pericardial or pleural effusion. There are T2 hyperintense lesions in the liver dome measuring up to 14 mm. These are circumscribed and probably are benign cysts. Similar probable benign left renal parapelvic cyst. Small gastric hiatal hernia suspected.  Posterior paraspinal soft tissues appear normal.  IMPRESSION: 1. Negative for age thoracic spine. No acute or inflammatory finding identified. 2. Probable benign cysts in the liver and left kidney.   Electronically Signed   By: Genevie Ann M.D.   On: 11/27/2014 18:18    Assessment/Plan: Madison Huerta is a 74 y.o. 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  Recommendations FU bcx 10/3 are positive - pre abx Repeat bcx done 10/5 Once cx neg x 48 hrs can  place picc Has some T spine pain - MRI neg Will need 4-6 weeks IV abx- CTX - dced vanco Thank you very much for the consult. Will follow with you.  [place], [place]   11/28/2014, 3:00 PM

## 2014-11-28 NOTE — Care Management Important Message (Signed)
Important Message  Patient Details  Name: Madison Huerta MRN: [id number] Date of Birth: [birth year]   Medicare Important Message Given:  Yes-second notification given    Marshell Garfinkel, RN 11/28/2014, 1:36 PM

## 2014-11-29 LAB — CULTURE, BLOOD (SINGLE)

## 2014-11-29 LAB — CULTURE, BLOOD (ROUTINE X 2)

## 2014-11-29 NOTE — Progress Notes (Signed)
Patient ID: Madison Huerta, female   DOB: [birth year], 74 y.o.   MRN: [id number] [facility] PROGRESS NOTE  PCP: Loura Pardon, MD  HPI/Subjective: Patient feeling a lot better. Offers no complaints. Slept well last night. Cough is less.  Objective: Filed Vitals:   11/29/14 1145  BP: 143/50  Pulse: 69  Temp: 98 F (36.7 C)  Resp: 19    Intake/Output Summary (Last 24 hours) at 11/29/14 1522 Last data filed at 11/29/14 0830  Gross per 24 hour  Intake    360 ml  Output      0 ml  Net    360 ml   Filed Weights   11/26/14 1620 11/26/14 1757  Weight: 56.246 kg (124 lb) 56.337 kg (124 lb 3.2 oz)    ROS: Review of Systems  Constitutional: Negative for fever and chills.  Eyes: Negative for blurred vision.  Respiratory: Positive for cough. Negative for shortness of breath.   Cardiovascular: Negative for chest pain.  Gastrointestinal: Negative for nausea, vomiting, abdominal pain, diarrhea and constipation.  Genitourinary: Negative for dysuria.  Musculoskeletal: Negative for joint pain.  Neurological: Negative for dizziness and headaches.   Exam: Physical Exam  Constitutional: She is oriented to person, place, and time.  HENT:  Nose: No mucosal edema.  Mouth/Throat: No oropharyngeal exudate or posterior oropharyngeal edema.  Eyes: Conjunctivae, EOM and lids are normal. Pupils are equal, round, and reactive to light.  Neck: No JVD present. Carotid bruit is not present. No edema present. No thyroid mass and no thyromegaly present.  Cardiovascular: S1 normal and S2 normal.  Exam reveals no gallop.   Murmur heard.  Systolic murmur is present with a grade of 4/6  Pulses:      Dorsalis pedis pulses are 2+ on the right side, and 2+ on the left side.  Respiratory: No respiratory distress. She has no wheezes. She has no rhonchi. She has no rales.  GI: Soft. Bowel sounds are normal. There is no tenderness.  Musculoskeletal:       Right ankle: She exhibits no swelling.        Left ankle: She exhibits no swelling.  Lymphadenopathy:    She has no cervical adenopathy.  Neurological: She is alert and oriented to person, place, and time. No cranial nerve deficit.  Skin: Skin is warm. No rash noted. Nails show no clubbing.  Psychiatric: She has a normal mood and affect.    Data Reviewed: Basic Metabolic Panel:  Recent Labs Lab 11/25/14 1030 11/26/14 1553 11/27/14 0401  NA 136 132* 139  K 4.1 3.6 3.9  CL 102 98* 106  CO2 24 23 24   GLUCOSE 105* 105* 101*  BUN 13 14 12   CREATININE 0.91 0.77 0.71  CALCIUM 9.2 8.9 8.7*   Liver Function Tests:  Recent Labs Lab 11/26/14 1553  AST 23  ALT 15  ALKPHOS 72  BILITOT 0.4  PROT 7.6  ALBUMIN 4.1   CBC:  Recent Labs Lab 11/23/14 0946 11/25/14 1030 11/26/14 1553 11/27/14 0401  WBC 10.2 10.8 10.5 7.8  NEUTROABS 8.3* 8.4* 7.7*  --   HGB 11.5* 11.3* 11.3* 10.4*  HCT 34.5* 33.3* 33.3* 31.2*  MCV 88.1 86.7 86.9 86.4  PLT 323.0 306 334 306   Cardiac Enzymes:  Recent Labs Lab 11/26/14 1553  TROPONINI <0.03     Recent Results (from the past 240 hour(s))  Culture, blood (single)     Status: None   Collection Time: 11/23/14  9:46 AM  Result  Value Ref Range Status   Organism ID, Bacteria VIRIDANS STREPTOCOCCUS  Final    Comment: Susceptibilities performed on previous culture within the last 5 days. Gram Stain Report Called to,Read Back By and Verified With: Lisbeth Ply RN @ 2:10 AM 11/25/14 BY THOMI   Urine culture     Status: None   Collection Time: 11/23/14  9:46 AM  Result Value Ref Range Status   Culture   Preliminary    Multiple bacterial morphotypes present, none predominant. Suggest appropriate recollection if  clinically indicated.    Colony Count 50,000 COLONIES/ML  Final   Organism ID, Bacteria Multiple bacterial morphotypes present, none  Final   Organism ID, Bacteria predominant. Suggest appropriate recollection if   Final   Organism ID, Bacteria clinically indicated.   Final  Culture, blood (single)     Status: None (Preliminary result)   Collection Time: 11/23/14  9:46 AM  Result Value Ref Range Status   Preliminary Report VIRIDANS STREPTOCOCCUS  Preliminary  Urine culture     Status: None   Collection Time: 11/25/14 10:30 AM  Result Value Ref Range Status   Specimen Description URINE, CLEAN CATCH  Final   Special Requests NONE  Final   Culture MULTIPLE SPECIES PRESENT, SUGGEST RECOLLECTION  Final   Report Status 11/26/2014 FINAL  Final  Blood culture (routine x 2)     Status: None   Collection Time: 11/25/14 10:30 AM  Result Value Ref Range Status   Specimen Description BLOOD LEFT ASSIST CONTROL  Final   Special Requests BOTTLES DRAWN AEROBIC AND ANAEROBIC  5CC  Final   Culture  Setup Time   Final    GRAM POSITIVE COCCI IN CHAINS IN BOTH AEROBIC AND ANAEROBIC BOTTLES CRITICAL RESULT CALLED TO, READ BACK BY AND VERIFIED WITH: STACEY PIERCE 11/26/14 1434 JGF    Culture   Final    STREPTOCOCCUS MUTANS IN BOTH AEROBIC AND ANAEROBIC BOTTLES REFER TO OTHER SET FOR SUSCEPTIBILITIES    Report Status 11/28/2014 FINAL  Final  Blood culture (routine x 2)     Status: None   Collection Time: 11/25/14 10:31 AM  Result Value Ref Range Status   Specimen Description BLOOD RIGHT ASSIST CONTROL  Final   Special Requests BOTTLES DRAWN AEROBIC AND ANAEROBIC  3CC  Final   Culture  Setup Time   Final    GRAM POSITIVE COCCI IN CHAINS IN BOTH AEROBIC AND ANAEROBIC BOTTLES CRITICAL RESULT CALLED TO, READ BACK BY AND VERIFIED WITH: STACEY PIERCE 11/26/14 1434 JGF    Culture   Final    STREPTOCOCCUS MUTANS IN BOTH AEROBIC AND ANAEROBIC BOTTLES    Report Status 11/28/2014 FINAL  Final   Organism ID, Bacteria STREPTOCOCCUS MUTANS  Final      Susceptibility   Streptococcus mutans - MIC*    ERYTHROMYCIN Value in next row Resistant      RESISTANT>=8    VANCOMYCIN Value in next row Sensitive      SENSITIVE1    CLINDAMYCIN Value in next row Resistant       RESISTANT>=1    AMPICILLIN Value in next row Sensitive      SENSITIVE<=0.25    LEVOFLOXACIN Value in next row Sensitive      SENSITIVE1    * STREPTOCOCCUS MUTANS  Culture, blood (routine x 2)     Status: None   Collection Time: 11/26/14  3:53 PM  Result Value Ref Range Status   Specimen Description BLOOD LEFT WRIST  Final   Special Requests  BOTTLES DRAWN AEROBIC AND ANAEROBIC 6CC  Final   Culture  Setup Time   Final    GRAM POSITIVE COCCI IN BOTH AEROBIC AND ANAEROBIC BOTTLES CRITICAL RESULT CALLED TO, READ BACK BY AND VERIFIED WITH: BROOKE ROBERTSON 11/27/14 MLZ     Culture   Final    STREPTOCOCCUS MUTANS IN BOTH AEROBIC AND ANAEROBIC BOTTLES    Report Status 11/29/2014 FINAL  Final   Organism ID, Bacteria STREPTOCOCCUS MUTANS  Final      Susceptibility   Streptococcus mutans - MIC*    ERYTHROMYCIN RESISTANT Resistant     CLINDAMYCIN RESISTANT Resistant     AMPICILLIN SENSITIVE Sensitive     LEVOFLOXACIN SENSITIVE Sensitive     LINEZOLID SENSITIVE Sensitive     VANCOMYCIN SENSITIVE Sensitive     * STREPTOCOCCUS MUTANS  Culture, blood (routine x 2)     Status: None (Preliminary result)   Collection Time: 11/26/14  9:32 PM  Result Value Ref Range Status   Specimen Description BLOOD RIGHT ANTECUBITAL  Final   Special Requests BOTTLES DRAWN AEROBIC AND ANAEROBIC 10ML  Final   Culture NO GROWTH 3 DAYS  Final   Report Status PENDING  Incomplete  Culture, blood (routine x 2)     Status: None (Preliminary result)   Collection Time: 11/28/14  2:40 PM  Result Value Ref Range Status   Specimen Description BLOOD RIGHT ASSIST CONTROL  Final   Special Requests BOTTLES DRAWN AEROBIC AND ANAEROBIC  [place]  Final   Culture NO GROWTH < 24 HOURS  Final   Report Status PENDING  Incomplete  Culture, blood (routine x 2)     Status: None (Preliminary result)   Collection Time: 11/28/14  2:48 PM  Result Value Ref Range Status   Specimen Description BLOOD LEFT ASSIST CONTROL  Final   Special  Requests BOTTLES DRAWN AEROBIC AND ANAEROBIC  [place]  Final   Culture NO GROWTH < 24 HOURS  Final   Report Status PENDING  Incomplete     Studies: Mr Thoracic Spine Wo Contrast  11/27/2014   CLINICAL DATA:  74 year old female with fever, chills, diaphoresis, thoracic spine pain. Viridans strep positive blood cultures and endocarditis with aortic valve vegetation.  EXAM: MRI THORACIC SPINE WITHOUT CONTRAST  TECHNIQUE: Multiplanar, multisequence MR imaging of the thoracic spine was performed. No intravenous contrast was administered.  COMPARISON:  Chest radiographs 10/2014 and earlier.  FINDINGS: Limited sagittal imaging of the cervical spine is remarkable for lower cervical disc degeneration with disc space loss, and mild retrolisthesis at C5-C6. Cervical spine marrow signal appears normal.  Preserved thoracic vertebral height and alignment. Mildly heterogeneous thoracic bone marrow signal, but no marrow edema or evidence of acute osseous abnormality. Thoracic intervertebral disc signal and morphology is normal for age throughout. No inflammatory signal within the thoracic or visualized upper lumbar discs.  No thoracic spinal stenosis. Spinal cord signal is within normal limits at all visualized levels. Conus medullaris appears normal at T12-L1.  Visualized thorax negative for pericardial or pleural effusion. There are T2 hyperintense lesions in the liver dome measuring up to 14 mm. These are circumscribed and probably are benign cysts. Similar probable benign left renal parapelvic cyst. Small gastric hiatal hernia suspected.  Posterior paraspinal soft tissues appear normal.  IMPRESSION: 1. Negative for age thoracic spine. No acute or inflammatory finding identified. 2. Probable benign cysts in the liver and left kidney.   Electronically Signed   By: Genevie Ann M.D.   On: 11/27/2014 18:18  Scheduled Meds: . amLODipine  5 mg Oral Daily  . anastrozole  1 mg Oral Daily  . cefTRIAXone (ROCEPHIN)  IV  2 g  Intravenous Q24H  . enoxaparin (LOVENOX) injection  40 mg Subcutaneous Q24H  . levothyroxine  75 mcg Oral QAC breakfast  . rosuvastatin  10 mg Oral Daily    Assessment/Plan:  1. Acute bacterial endocarditis with bacteremia. Outpatient blood culture grew strep viridans. Inpatient culture growing strep mutans. Patient on IV Rocephin. Hopefully yesterday's blood cultures will be negative and I can place a PICC line tomorrow. Patient will need 4-6 weeks of IV antibiotics. 2. Hypothyroidism unspecified continue levothyroxine 3. Hyperlipidemia unspecified continue Crestor 4. History of breast cancer on anastrozole  Code Status:     Code Status Orders        Start     Ordered   11/26/14 1655  Full code   Continuous     11/26/14 1657     Family Communication: Husband Disposition Plan:  home with IV antibiotics, earliest possible date would be Friday evening  Consultants:  Cardiology  infectious disease  Procedures:  TEE   Antibiotics:   Rocephin    vancomycin  Time spent:  22 minutes   Loletha Grayer  [facility]

## 2014-11-29 NOTE — Care Management (Signed)
PICC placement tomorrow pending cultures per patient/husband. [facility] has met with both and explained benefits for Rocephin. Orders needed for PICC line care per policy. Lab draws including date and time of draw. And Rx for antibiotic with start and stop date.

## 2014-11-30 ENCOUNTER — Inpatient Hospital Stay: Payer: Medicare Other

## 2014-11-30 DIAGNOSIS — R509 Fever, unspecified: Secondary | ICD-10-CM | POA: Insufficient documentation

## 2014-11-30 LAB — CREATININE, SERUM: Creatinine, Ser: 0.73 mg/dL (ref 0.44–1.00)

## 2014-11-30 MED ORDER — DEXTROSE 5 % IV SOLN
2.0000 g | INTRAVENOUS | Status: DC
  Administered 2014-11-30: 2 g via INTRAVENOUS
  Filled 2014-11-30: qty 2

## 2014-11-30 MED ORDER — DEXTROSE 5 % IV SOLN: 2.0000 g | INTRAVENOUS | Status: DC

## 2014-11-30 NOTE — Progress Notes (Signed)
Infectious Disease Long Term IV Antibiotic Orders  Diagnosis: Endocarditis  Culture results Strep Mutans  Allergies:  Allergies  Allergen Reactions  . Codeine Other (See Comments)    Reaction:  Severe headaches   . Penicillins Hives and Other (See Comments)    Has patient had a PCN reaction causing immediate rash, facial/tongue/throat swelling, SOB or lightheadedness with hypotension: No Has patient had a PCN reaction causing severe rash involving mucus membranes or skin necrosis: No Has patient had a PCN reaction that required hospitalization No Has patient had a PCN reaction occurring within the last 10 years: No If all of the above answers are "NO", then may proceed with Cephalosporin use.  . Adhesive [Tape] Rash    Discharge antibiotics  Ceftriaxone 2 grams every   24 hours  PICC Care per protocol  Labs weekly while on IV antibiotics  -FAX weekly labs to [phone] CBC w diff   Comprehensive met panel   Planned duration of antibiotics 4 weeks  Stop date 12/25/2014  Follow up clinic date TBD   Leonel Ramsay, MD

## 2014-11-30 NOTE — Care Management (Signed)
[facility] notified of patient discharge. HHRN arranged for IV infusion/directions to patient/family. No further RNCM needs. Case closed.

## 2014-11-30 NOTE — Discharge Summary (Signed)
[facility] NAME: Madison Huerta    MR#:  [id number]  DATE OF BIRTH:  08/26/1940  DATE OF ADMISSION:  11/26/2014 ADMITTING PHYSICIAN: Loletha Grayer, MD  DATE OF DISCHARGE: 11/30/2014  PRIMARY CARE PHYSICIAN: Loura Pardon, MD    ADMISSION DIAGNOSIS:  Bacteremia [R78.81]  DISCHARGE DIAGNOSIS:  Active Problems:   Endocarditis   Gram-positive cocci bacteremia   Bacteremia   VSD (ventricular septal defect)   Pyrexia   SECONDARY DIAGNOSIS:   Past Medical History  Diagnosis Date  . Hyperlipidemia   . Hypertension   . Thyroid disease     Hypothyroidism  . Osteopenia   . History of breast cancer   . Overweight(278.02)   . VSD (ventricular septal defect)     does need SBE prophylaxis (Keflex  3 gm 1 hour pprior and 1.5 gm 6 hours post  . Heart murmur     VSD- echocardiogram - [facility] 10 yrs. ago  . Hypothyroidism   . GERD (gastroesophageal reflux disease)   . Breast cancer ([place])     L breast- 1994- lumpectomy, care for at [facility]   . VSD (ventricular septal defect)   . Cataract     HOSPITAL COURSE:   1. Acute bacterial endocarditis, confirmed by TEE. Blood cultures grew out strep viridans and repeat blood cultures strep mutations. So far repeat blood cultures on 11/28/2014 are negative for 2 days. Patient will need 4-6 weeks of IV Rocephin. I wrote the prescription for 6 weeks of treatment. But I will leave it up to infectious disease Dr. Ola Spurr to decide whether 4 weeks is enough. Weekly labs to go to Dr. Ola Spurr. Home health nurse to help with infusion and care of PICC line. 2. History of VSD 3 hypothyroidism unspecified continue levothyroxine 4 history of breast cancer on a remote ex- 5 gastroesophageal reflux disease on PPI 6. Hyperlipidemia unspecified continue statin.  DISCHARGE CONDITIONS:   Satisfactory  CONSULTS OBTAINED:  Treatment Team:  Minna Merritts, MD Adrian Prows, MD  DRUG  ALLERGIES:   Allergies  Allergen Reactions  . Codeine Other (See Comments)    Reaction:  Severe headaches   . Penicillins Hives and Other (See Comments)    Has patient had a PCN reaction causing immediate rash, facial/tongue/throat swelling, SOB or lightheadedness with hypotension: No Has patient had a PCN reaction causing severe rash involving mucus membranes or skin necrosis: No Has patient had a PCN reaction that required hospitalization No Has patient had a PCN reaction occurring within the last 10 years: No If all of the above answers are "NO", then may proceed with Cephalosporin use.  . Adhesive [Tape] Rash    DISCHARGE MEDICATIONS:   Current Discharge Medication List    START taking these medications   Details  cefTRIAXone 2 g in dextrose 5 % 50 mL Inject 2 g into the vein daily. Qty: 37 Dose, Refills: 0      CONTINUE these medications which have NOT CHANGED   Details  acetaminophen (TYLENOL) 500 MG tablet Take 500 mg by mouth every 4 (four) hours as needed for fever.    alendronate (FOSAMAX) 70 MG tablet take 1 tablet by mouth every week on an empty stomach with 6-8 oz of water. No food / meds for 30 min. Remain Upright Qty: 4 tablet, Refills: 11    amLODipine (NORVASC) 5 MG tablet Take 1 tablet (5 mg total) by mouth daily. Qty: 30 tablet, Refills: 11  anastrozole (ARIMIDEX) 1 MG tablet Take 1 tablet (1 mg total) by mouth daily. 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 Qty: 30 tablet, Refills: 11    rosuvastatin (CRESTOR) 10 MG tablet Take 1 tablet (10 mg total) by mouth daily. Qty: 30 tablet, Refills: 11    benzonatate (TESSALON) 200 MG capsule Take 1 capsule (200 mg total) by mouth 3 (three) times daily as needed for cough (swallow whole, do not bite pill). Qty: 30  capsule, Refills: 0         DISCHARGE INSTRUCTIONS:   Follow-up PMD one week Follow-up with Dr. Ola Spurr in 2 weeks [place] for PICC line care as per policy and weekly labs CBC and BMP on Thursday with results to go to Dr. Ola Spurr.  If you experience worsening of your admission symptoms, develop shortness of breath, life threatening emergency, suicidal or homicidal thoughts you must seek medical attention immediately by calling 911 or calling your MD immediately  if symptoms less severe.  You Must read complete instructions/literature along with all the possible adverse reactions/side effects for all the Medicines you take and that have been prescribed to you. Take any new Medicines after you have completely understood and accept all the possible adverse reactions/side effects.   Please note  You were cared for by a hospitalist during your hospital stay. If you have any questions about your discharge medications or the care you received while you were in the hospital after you are discharged, you can call the unit and asked to speak with the hospitalist on call if the hospitalist that took care of you is not available. Once you are discharged, your primary care physician will handle any further medical issues. Please note that NO REFILLS for any discharge medications will be authorized once you are discharged, as it is imperative that you return to your primary care physician (or establish a relationship with a primary care physician if you do not have one) for your aftercare needs so that they can reassess your need for medications and monitor your lab values.    Today   CHIEF COMPLAINT:   Chief Complaint  Patient presents with  . Blood Infection    HISTORY OF PRESENT ILLNESS:  Madison Huerta  is a 74 y.o. female with a known history of VSD presented with fever and not feeling well for 6 weeks. She had outpatient blood cultures that were positive and she was referred  in to the hospital for suspected endocarditis   VITAL SIGNS:  Blood pressure 123/53, pulse 65, temperature 97.9 F (36.6 C), temperature source Oral, resp. rate 16, height 5' (1.524 m), weight 56.337 kg (124 lb 3.2 oz), SpO2 99 %.    PHYSICAL EXAMINATION:  GENERAL:  74 y.o.-year-old patient lying in the bed with no acute distress.  EYES: Pupils equal, round, reactive to light and accommodation. No scleral icterus. Extraocular muscles intact.  HEENT: Head atraumatic, normocephalic. Oropharynx and nasopharynx clear.  NECK:  Supple, no jugular venous distention. No thyroid enlargement, no tenderness.  LUNGS: Normal breath sounds bilaterally, no wheezing, rales,rhonchi or crepitation. No use of accessory muscles of respiration.  CARDIOVASCULAR: S1, S2 normal. 4/6 holosystolic systolic murmur, no rubs, or gallops.  ABDOMEN: Soft, non-tender, non-distended. Bowel sounds present. No organomegaly or mass.  EXTREMITIES: No pedal edema, cyanosis, or  clubbing.  NEUROLOGIC: Cranial nerves II through XII are intact. Muscle strength 5/5 in all extremities. Sensation intact. Gait not checked.  PSYCHIATRIC: The patient is alert and oriented x 3.  SKIN: No obvious rash, lesion, or ulcer.   DATA REVIEW:   CBC  Recent Labs Lab 11/27/14 0401  WBC 7.8  HGB 10.4*  HCT 31.2*  PLT 306    Chemistries   Recent Labs Lab 11/26/14 1553 11/27/14 0401 11/30/14 0340  NA 132* 139  --   K 3.6 3.9  --   CL 98* 106  --   CO2 23 24  --   GLUCOSE 105* 101*  --   BUN 14 12  --   CREATININE 0.77 0.71 0.73  CALCIUM 8.9 8.7*  --   AST 23  --   --   ALT 15  --   --   ALKPHOS 72  --   --   BILITOT 0.4  --   --     Cardiac Enzymes  Recent Labs Lab 11/26/14 1553  TROPONINI <0.03    Microbiology Results  Results for orders placed or performed during the hospital encounter of 11/26/14  Culture, blood (routine x 2)     Status: None   Collection Time: 11/26/14  3:53 PM  Result Value Ref Range  Status   Specimen Description BLOOD LEFT WRIST  Final   Special Requests BOTTLES DRAWN AEROBIC AND ANAEROBIC 6CC  Final   Culture  Setup Time   Final    GRAM POSITIVE COCCI IN BOTH AEROBIC AND ANAEROBIC BOTTLES CRITICAL RESULT CALLED TO, READ BACK BY AND VERIFIED WITH: BROOKE ROBERTSON 11/27/14 MLZ     Culture   Final    STREPTOCOCCUS MUTANS IN BOTH AEROBIC AND ANAEROBIC BOTTLES    Report Status 11/29/2014 FINAL  Final   Organism ID, Bacteria STREPTOCOCCUS MUTANS  Final      Susceptibility   Streptococcus mutans - MIC*    ERYTHROMYCIN RESISTANT Resistant     CLINDAMYCIN RESISTANT Resistant     AMPICILLIN SENSITIVE Sensitive     LEVOFLOXACIN SENSITIVE Sensitive     LINEZOLID SENSITIVE Sensitive     VANCOMYCIN SENSITIVE Sensitive     * STREPTOCOCCUS MUTANS  Culture, blood (routine x 2)     Status: None (Preliminary result)   Collection Time: 11/26/14  9:32 PM  Result Value Ref Range Status   Specimen Description BLOOD RIGHT ANTECUBITAL  Final   Special Requests BOTTLES DRAWN AEROBIC AND ANAEROBIC 10ML  Final   Culture NO GROWTH 4 DAYS  Final   Report Status PENDING  Incomplete  Culture, blood (routine x 2)     Status: None (Preliminary result)   Collection Time: 11/28/14  2:40 PM  Result Value Ref Range Status   Specimen Description BLOOD RIGHT ASSIST CONTROL  Final   Special Requests BOTTLES DRAWN AEROBIC AND ANAEROBIC  [place]  Final   Culture NO GROWTH 2 DAYS  Final   Report Status PENDING  Incomplete  Culture, blood (routine x 2)     Status: None (Preliminary result)   Collection Time: 11/28/14  2:48 PM  Result Value Ref Range Status   Specimen Description BLOOD LEFT ASSIST CONTROL  Final   Special Requests BOTTLES DRAWN AEROBIC AND ANAEROBIC  [place]  Final   Culture NO GROWTH 2 DAYS  Final   Report Status PENDING  Incomplete    Management plans discussed with the patient, family and they are in agreement.  CODE STATUS:  Code Status Orders        Start     Ordered    11/26/14 1655  Full code   Continuous     11/26/14 1657      TOTAL TIME TAKING CARE OF THIS PATIENT: 35 minutes. Greater than 50% of the time spent in coordination of care about discharge plan   Milas [email or web address] on 11/30/2014 at 9:09 AM  Between 7am to 6pm - Pager - [phone]  After 6pm go to [email or web address] - password [email or web address] [facility]  [facility]  Office  [phone]  CC: Primary care physician; Loura Pardon, MD

## 2014-11-30 NOTE — Discharge Instructions (Signed)
CBC and BMP weekly on Thursday and results to Dr Adrian Prows

## 2014-11-30 NOTE — Care Management Important Message (Signed)
Important Message  Patient Details  Name: Madison Huerta MRN: [id number] Date of Birth: [birth year]   Medicare Important Message Given:  Yes-third notification given    Marshell Garfinkel, RN 11/30/2014, 8:40 AM

## 2014-11-30 NOTE — Progress Notes (Signed)
[place]  [phone]) [phone] [phone]) [phone]     ID: SAMIE BARCLIFT OB: [birth year]  MR#: [id number]  GXQ#:[id number]  Patient Care Team: Abner Greenspan, MD as PCP - General  CHIEF COMPLAINT/DIAGNOSIS:  History of bilateral Breast cancer:  1. 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 Underwent mastectomy on 01/08/12, with pathology showng multifocal disease the largest one measuring 1.2 cm (tumor sizes were 1.2 cm, 1.0 cm and 0.5 cm). Tumors were ER positive, PR positive, Her-2/neu negative, with unfavorable Ki-67. Two lymph nodes reportedly negative. On Anastrazole since Dec 2013  2. h/o Left breast cancer 1994 treated with lumpectomy and ALND, radiation followed by Tamoxifen   HISTORY OF PRESENT ILLNESS:  Patient returns for continued oncology evaluation for history of breast cancer as described above, she was last seen about 6 to 7 months ago. States that she is doing fairly steady since last evaluation. Appetite is good, denies weight loss. She is taking anastrozole and denies any new side effects from this. She takes alendronate for osteopenia also. No new cough, chest pain, or hemoptysis. No new bone pains. Denies feeling any breast masses on self exam. No other pain issues. No new mood disturbances.   REVIEW OF SYSTEMS:   ROS As in HPI above. In addition, no fever, chills or sweats. No new headaches or focal weakness.  No new mood disturbances. No  sore throat, cough, shortness of breath, sputum, hemoptysis or chest pain. No dizziness or palpitation. No abdominal pain, constipation, diarrhea, dysuria or hematuria. No new skin rash or bleeding symptoms. No new paresthesias in extremities.  Otherwise, a complete review of systems is negative.  PAST MEDICAL HISTORY: Reviewed. Past Medical History  Diagnosis Date  . Hyperlipidemia   . Hypertension   . Thyroid disease     Hypothyroidism  . Osteopenia     . History of breast cancer   . Overweight(278.02)   . VSD (ventricular septal defect)     does need SBE prophylaxis (Keflex  3 gm 1 hour pprior and 1.5 gm 6 hours post  . Heart murmur     VSD- echocardiogram - [facility] 10 yrs. ago  . Hypothyroidism   . GERD (gastroesophageal reflux disease)   . Breast cancer ([place])     L breast- 1994- lumpectomy, care for at [facility]   . VSD (ventricular septal defect)   . Cataract     PAST SURGICAL HISTORY: Reviewed. Past Surgical History  Procedure Laterality Date  . Breast lumpectomy  1994    Radiation  . Parathyroidectomy  12/2002  . 2d echo  2004    Normal at [facility]  . Breast implant removal  1994  . Tonsillectomy      As a child  . Tubal ligation      1976  . Mastectomy  01/08/2012    RIGHT  . Mastectomy w/ sentinel node biopsy  01/08/2012    Procedure: MASTECTOMY WITH SENTINEL LYMPH NODE BIOPSY;  Surgeon: Madilyn Hook, DO;  Location: [place];  Service: General;  Laterality: Right;  right breast mastectomy with sentinel lymph node biopsy     FAMILY HISTORY: Reviewed. Family History  Problem Relation Age of Onset  . Aneurysm Mother     brain  . Cancer Father     Lung, Liver mets  . Lung cancer Father   . Heart disease Other   . Cancer Cousin     Breast CA  . Breast  cancer Cousin   . Breast cancer Maternal Aunt 71    SOCIAL HISTORY: Reviewed. Social History  Substance Use Topics  . Smoking status: Never Smoker   . Smokeless tobacco: Never Used  . Alcohol Use: 0.0 oz/week    0 Standard drinks or equivalent per week     Comment: 1 glass of wine occ    Allergies  Allergen Reactions  . Codeine Other (See Comments)    Reaction:  Severe headaches   . Penicillins Hives and Other (See Comments)    Has patient had a PCN reaction causing immediate rash, facial/tongue/throat swelling, SOB or lightheadedness with hypotension: No Has patient had a PCN reaction causing severe rash involving mucus membranes or skin necrosis: No Has patient  had a PCN reaction that required hospitalization No Has patient had a PCN reaction occurring within the last 10 years: No If all of the above answers are "NO", then may proceed with Cephalosporin use.  . Adhesive [Tape] Rash    Current Outpatient Prescriptions  Medication Sig Dispense Refill  . alendronate (FOSAMAX) 70 MG tablet take 1 tablet by mouth every week on an empty stomach with 6-8 oz of water. No food / meds for 30 min. Remain Upright (Patient taking differently: Take 70 mg by mouth once a week. Pt takes on Sunday.) 4 tablet 11  . amLODipine (NORVASC) 5 MG tablet Take 1 tablet (5 mg total) by mouth daily. 30 tablet 11  . anastrozole (ARIMIDEX) 1 MG tablet Take 1 tablet (1 mg total) by mouth daily. 90 tablet 2  . benzonatate (TESSALON) 200 MG capsule Take 1 capsule (200 mg total) by mouth 3 (three) times daily as needed for cough (swallow whole, do not bite pill). (Patient not taking: Reported on 11/26/2014) 30 capsule 0  . levothyroxine (SYNTHROID, LEVOTHROID) 75 MCG tablet Take 1 tablet (75 mcg total) by mouth daily before breakfast. 30 tablet 11  . rosuvastatin (CRESTOR) 10 MG tablet Take 1 tablet (10 mg total) by mouth daily. 30 tablet 11  . acetaminophen (TYLENOL) 500 MG tablet Take 500 mg by mouth every 4 (four) hours as needed for fever.    . cefTRIAXone 2 g in dextrose 5 % 50 mL Inject 2 g into the vein daily. 37 Dose 0   No current facility-administered medications for this visit.    PHYSICAL EXAM: Filed Vitals:   11/14/14 1042  BP: 135/71  Pulse: 84  Temp: 99 F (37.2 C)     Body mass index is 24.44 kg/(m^2).      GENERAL: Patient is alert and oriented and in no acute distress. There is no icterus. HEENT: EOMs intact. No cervical lymphadenopathy. CVS: S1S2, regular LUNGS: Bilaterally clear to auscultation, no rhonchi. ABDOMEN: Soft, nontender. No hepatomegaly clinically.  EXTREMITIES: No pedal edema. BREASTS: no dominant mass in either breast. No axillary  adenopathy on either side. Exam performed in presence of a nurse.    LAB RESULTS: Creatinine 1.01, lft unremarkable, hemoglobin 11.9, WBC 10.2, platelets 275.  STUDIES: Dg Chest 2 View  11/13/2014   CLINICAL DATA:  Cough for the past 3-4 weeks.  Low-grade fever.  EXAM: CHEST  2 VIEW  COMPARISON:  01/01/2012.  FINDINGS: Normal sized heart. Clear lungs. The lungs remain hyperexpanded. A right posterior diaphragmatic eventration or hernia is again demonstrated. Bilateral postmastectomy changes with bilateral axillary surgical clips are again demonstrated. Thoracic spine degenerative changes.  IMPRESSION: Stable changes of COPD.  No acute abnormality.   Electronically Signed   By:  Claudie Revering M.D.   On: 11/13/2014 12:24   Mr Thoracic Spine Wo Contrast  11/27/2014   CLINICAL DATA:  74 year old female with fever, chills, diaphoresis, thoracic spine pain. Viridans strep positive blood cultures and endocarditis with aortic valve vegetation.  EXAM: MRI THORACIC SPINE WITHOUT CONTRAST  TECHNIQUE: Multiplanar, multisequence MR imaging of the thoracic spine was performed. No intravenous contrast was administered.  COMPARISON:  Chest radiographs 10/2014 and earlier.  FINDINGS: Limited sagittal imaging of the cervical spine is remarkable for lower cervical disc degeneration with disc space loss, and mild retrolisthesis at C5-C6. Cervical spine marrow signal appears normal.  Preserved thoracic vertebral height and alignment. Mildly heterogeneous thoracic bone marrow signal, but no marrow edema or evidence of acute osseous abnormality. Thoracic intervertebral disc signal and morphology is normal for age throughout. No inflammatory signal within the thoracic or visualized upper lumbar discs.  No thoracic spinal stenosis. Spinal cord signal is within normal limits at all visualized levels. Conus medullaris appears normal at T12-L1.  Visualized thorax negative for pericardial or pleural effusion. There are T2 hyperintense  lesions in the liver dome measuring up to 14 mm. These are circumscribed and probably are benign cysts. Similar probable benign left renal parapelvic cyst. Small gastric hiatal hernia suspected.  Posterior paraspinal soft tissues appear normal.  IMPRESSION: 1. Negative for age thoracic spine. No acute or inflammatory finding identified. 2. Probable benign cysts in the liver and left kidney.   Electronically Signed   By: Genevie Ann M.D.   On: 11/27/2014 18:18   Dg Bone Density  11/12/2014   ADDENDUM REPORT: 11/12/2014 11:53 ADDENDUM: After further review, patient's diagnostic category is LOW BONE MASS by WHO criteria. Fracture risk is INCREASED. FRAX:  Not calculated because of history of bone building therapy. Electronically Signed   By: Franki Cabot M.D.   On: 11/12/2014 11:53  11/12/2014   ADDENDUM REPORT: 11/12/2014 11:19 ADDENDUM: After further review, there has actually been a statistically significant IMPROVEMENT compared to patient's previous exam of 02/22/2013 and also compared to patient's earlier baseline exam of 03/05/2010. Electronically Signed   By: Franki Cabot M.D.   On: 11/12/2014 11:19  11/12/2014   CLINICAL DATA:  Postmenopausal. No history of pathologic fracture. History of breast cancer status post mastectomy.  EXAM: DUAL X-RAY ABSORPTIOMETRY (DXA) FOR BONE MINERAL DENSITY  FINDINGS: AP LUMBAR SPINE L1 through L3  Bone Mineral Density (BMD):  1.037 g/cm2  Young Adult T-Score:  -1.2  Z-Score:  0.5  LEFT FEMUR NECK  Bone Mineral Density (BMD):  0.852 g/cm2  Young Adult T-Score: -1.3  Z-Score:  0.5  ASSESSMENT: Patient's diagnostic category is NORMAL by WHO Criteria.  FRACTURE RISK: NOT INCREASED  FRAX:  Not calculated because all T-scores are at or above -1.5.  COMPARISON: There has been a statistically significant worsening compared to patient's previous exam of 2014.  Effective therapies are available in the form of bisphosphonates, selective estrogen receptor modulators, biologic agents, and  hormone replacement therapy (for women). All patients should ensure an adequate intake of dietary calcium (1200 mg daily) and vitamin D (800 IU daily) unless contraindicated.  All treatment decisions require clinical judgment and consideration of individual patient factors, including patient preferences, co-morbidities, previous drug use, risk factors not captured in the FRAX model (e.g., frailty, falls, vitamin D deficiency, increased bone turnover, interval significant decline in bone density) and possible under- or over-estimation of fracture risk by FRAX.  The National Osteoporosis Foundation recommends that FDA-approved medical therapies be  considered in postmenopausal women and men age 50 or older with a:  1. Hip or vertebral (clinical or morphometric) fracture.  2. T-score of -2.5 or lower at the spine or hip.  3. Ten-year fracture probability by FRAX of 3% or greater for hip fracture or 20% or greater for major osteoporotic fracture.  People with diagnosed cases of osteoporosis or at high risk for fracture should have regular bone mineral density tests. For patients eligible for Medicare, routine testing is allowed once every 2 years. The testing frequency can be increased to one year for patients who have rapidly progressing disease, those who are receiving or discontinuing medical therapy to restore bone mass, or have additional risk factors.  World Pharmac[facility]) Criteria:  Normal: T-scores from +1.0 to -1.0  Low Bone Mass (Osteopenia): T-scores between -1.0 and -2.5  Osteoporosis: T-scores -2.5 and below  Comparison to Reference Population:  T-score is the key measure used in the diagnosis of osteoporosis and relative risk determination for fracture. It provides a value for bone mass relative to the mean bone mass of a young adult reference population expressed in terms of standard deviation (SD).  Z-score is the age-matched score showing the patient's values compared to a population matched  for age, sex, and race. This is also expressed in terms of standard deviation. The patient may have values that compare favorably to the age-matched values and still be at increased risk for fracture.  Electronically Signed: By: Franki Cabot M.D. On: 11/12/2014 10:44   Dg Chest Port 1 View  11/30/2014   CLINICAL DATA:  Status post PICC line placement  EXAM: PORTABLE CHEST - 1 VIEW  COMPARISON:  11/13/2014  FINDINGS: Cardiac shadow is within normal limits. A left-sided PICC line is seen with the catheter tip in the mid superior vena cava. This could be advanced 2.5 cm as clinically indicated. The lungs are clear. Postsurgical changes are noted in the axilla bilaterally.  IMPRESSION: Left-sided PICC line with the catheter tip in the mid superior vena cava. This could be advanced 2.5 cm as clinically necessary to the cavoatrial junction.   Electronically Signed   By: Inez Catalina M.D.   On: 11/30/2014 11:50   Mm Screening Breast Tomo Uni L  11/12/2014   CLINICAL DATA:  Screening.  EXAM: DIGITAL SCREENING UNILATERAL LEFT MAMMOGRAM WITH TOMO AND CAD  COMPARISON:  Previous exam(s).  ACR Breast Density Category a: The breast tissue is almost entirely fatty.  FINDINGS: There are no findings suspicious for malignancy. Images were processed with CAD.  IMPRESSION: No mammographic evidence of malignancy. A result letter of this screening mammogram will be mailed directly to the patient.  RECOMMENDATION: Screening mammogram in one year. (Code:SM-B-01Y)  BI-RADS CATEGORY  1: Negative.   Electronically Signed   By: Lovey Newcomer M.D.   On: 11/12/2014 12:54   STAGING: Carcinoma of lower-inner quadrant of breast ([place])   Staging form: Breast, AJCC 7th Edition     Clinical: Stage IIB (T3, N0, cM0) - Unsigned     Pathologic: No stage assigned - Unsigned    ASSESSMENT / PLAN:   History of bilateral Breast cancer: 1. 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  Underwent mastectomy on 01/08/12, with pathology showng multifocal disease the largest one measuring 1.2 cm (tumor sizes were 1.2 cm, 1.0 cm and 0.5 cm). Tumors were ER positive, PR positive, Her-2neu negative, with unfavorable Ki-67. Two lymph nodes reportedly negative.  On Anastrazole since Dec 2013. 2. h/o Left breast cancer 1994 treated with lumpectomy, radiation and ALND followed by Tamoxifen x 5 years.  Reviewed labs from today and d/w patient in detail. Serum CA 27.29 level remains normal. Last left-sided mammogram in September was B-IRADS 1 category negative. Clinically no evidence to suggest recurrent or metastatic breast cancer. Plan is to continue current hormonal therapy with anastrozole 1 mg p.o. daily, plan for a total of 5 years' duration of treatment.  Next MD followup in 5-6 months with CBC, creatinine, LFT, CA 27.29 level.  3. Osteoporosis surveillance -  DEXA scan on 11/12/14 reported unremarkable. On Ca + D, fosamax.  4. In between visits, patient advised to call in case of any new breast masses felt on self exam, new side effects from anastrozole, new symptoms or acute sickness and will be evaluated sooner. She is agreeable to this plan.     Leia Alf, MD   11/30/2014 10:05 PM

## 2014-12-01 LAB — CULTURE, BLOOD (ROUTINE X 2): Culture: NO GROWTH

## 2014-12-03 ENCOUNTER — Telehealth: Payer: Self-pay | Admitting: *Deleted

## 2014-12-03 LAB — CULTURE, BLOOD (ROUTINE X 2)
CULTURE: NO GROWTH
Culture: NO GROWTH

## 2014-12-03 NOTE — Telephone Encounter (Signed)
Transition Care Management Follow-up Telephone Call   Date discharged? 11/30/14   How have you been since you were released from the hospital? Much improved.   Do you understand why you were in the hospital? yes   Do you understand the discharge instructions? yes   Where were you discharged to? Home   Items Reviewed:  Medications reviewed: yes  Allergies reviewed: yes  Dietary changes reviewed: no  Referrals reviewed: yes, home health   Functional Questionnaire:   Activities of Daily Living (ADLs):   She states they are independent in the following: ambulation, bathing and hygiene, feeding, continence, grooming, toileting and dressing States they require assistance with the following: None   Any transportation issues/concerns?: no   Any patient concerns? no   Confirmed importance and date/time of follow-up visits scheduled yes, 12/07/14 @ 1200  Provider Appointment booked with Loura Pardon, MD  Confirmed with patient if condition begins to worsen call PCP or go to the ER.  Patient was given the office number and encouraged to call back with question or concerns.  : yes

## 2014-12-05 ENCOUNTER — Telehealth: Payer: Self-pay

## 2014-12-05 MED ORDER — NYSTATIN 100000 UNIT/ML MT SUSP: OROMUCOSAL | Status: DC

## 2014-12-05 NOTE — Telephone Encounter (Signed)
Amy [facility] with [facility] left v/m; pt is taking IV abx and that has caused yeast infection and sensitivity to tongue for carbonated drinks or acidic foods. Amy request med such as magic mouthwash to be called in and  Amy request [facility] [address]

## 2014-12-05 NOTE — Telephone Encounter (Signed)
I'm more concerned with thrush due to the abx Will send nystatin mouthwash  Update if this does not help

## 2014-12-06 NOTE — Telephone Encounter (Signed)
Amy notified Rx sent to pharmacy and to update [place] if no improvement

## 2014-12-07 ENCOUNTER — Encounter: Payer: Self-pay | Admitting: Family Medicine

## 2014-12-07 ENCOUNTER — Ambulatory Visit (INDEPENDENT_AMBULATORY_CARE_PROVIDER_SITE_OTHER): Payer: Medicare Other | Admitting: Family Medicine

## 2014-12-07 VITALS — BP 130/72 | HR 72 | Temp 97.9°F | Ht 60.25 in | Wt 122.5 lb

## 2014-12-07 DIAGNOSIS — Q210 Ventricular septal defect: Secondary | ICD-10-CM

## 2014-12-07 DIAGNOSIS — I38 Endocarditis, valve unspecified: Secondary | ICD-10-CM | POA: Diagnosis not present

## 2014-12-07 DIAGNOSIS — R7881 Bacteremia: Secondary | ICD-10-CM

## 2014-12-07 NOTE — Patient Instructions (Signed)
I'm glad you are doing better  Continue the antibiotic as scheduled and follow ups with Dr Ola Spurr  Start a multivitamin with iron daily  Keep up fluid intake Get rest and increase activity gradually as tolerated  Your repeat blood cultures are negative

## 2014-12-07 NOTE — Progress Notes (Signed)
Subjective:    Patient ID: Madison Huerta, female    DOB: August 09, 1940, 74 y.o.   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  Of note-pt had abscessed tooth over the summer and lot of dental work done over the summer (with prophylaxis)   Was tx with IV vanco and then rocephin  Will continue this for a total of 4-6 wk by Picc line (rocephin)  Sees Dr Ola Spurr (ID) Was seen by Dr Rockey Situ (cardiology in the hospital)  Results for orders placed or performed during the hospital encounter of 11/26/14  Culture, blood (routine x 2)  Result Value Ref Range   Specimen Description BLOOD LEFT WRIST    Special Requests BOTTLES DRAWN AEROBIC AND ANAEROBIC 6CC    Culture  Setup Time      GRAM POSITIVE COCCI IN BOTH AEROBIC AND ANAEROBIC BOTTLES CRITICAL RESULT CALLED TO, READ BACK BY AND VERIFIED WITH: BROOKE ROBERTSON 11/27/14 MLZ     Culture      STREPTOCOCCUS MUTANS IN BOTH AEROBIC AND ANAEROBIC BOTTLES    Report Status 11/29/2014 FINAL    Organism ID, Bacteria STREPTOCOCCUS MUTANS       Susceptibility   Streptococcus mutans - MIC*    ERYTHROMYCIN RESISTANT Resistant     CLINDAMYCIN RESISTANT Resistant     AMPICILLIN SENSITIVE Sensitive     LEVOFLOXACIN SENSITIVE Sensitive     LINEZOLID SENSITIVE Sensitive     VANCOMYCIN SENSITIVE Sensitive     * STREPTOCOCCUS MUTANS  Culture, blood (routine x 2)  Result Value Ref Range   Specimen Description BLOOD RIGHT ANTECUBITAL    Special Requests BOTTLES DRAWN AEROBIC AND ANAEROBIC 10ML    Culture NO GROWTH 5 DAYS    Report Status 12/01/2014 FINAL   Culture, blood (routine x 2)  Result Value Ref Range   Specimen Description BLOOD RIGHT ASSIST CONTROL    Special Requests BOTTLES DRAWN AEROBIC AND ANAEROBIC  [place]    Culture NO GROWTH 5 DAYS    Report Status 12/03/2014 FINAL   Culture, blood (routine x 2)  Result Value Ref Range   Specimen  Description BLOOD LEFT ASSIST CONTROL    Special Requests BOTTLES DRAWN AEROBIC AND ANAEROBIC  [place]    Culture NO GROWTH 5 DAYS    Report Status 12/03/2014 FINAL   CBC with Differential/Platelet  Result Value Ref Range   WBC 10.5 3.6 - 11.0 K/uL   RBC 3.83 3.80 - 5.20 MIL/uL   Hemoglobin 11.3 (L) 12.0 - 16.0 g/dL   HCT 33.3 (L) 35.0 - 47.0 %   MCV 86.9 80.0 - 100.0 fL   MCH 29.5 26.0 - 34.0 pg   MCHC 33.9 32.0 - 36.0 g/dL   RDW 13.1 11.5 - 14.5 %   Platelets 334 150 - 440 K/uL   Neutrophils Relative % 74 %   Neutro Abs 7.7 (H) 1.4 - 6.5 K/uL   Lymphocytes Relative 16 %   Lymphs Abs 1.7 1.0 - 3.6 K/uL   Monocytes Relative 9 %   Monocytes Absolute 1.0 (H) 0.2 - 0.9 K/uL   Eosinophils Relative 0 %   Eosinophils Absolute 0.0 0 - 0.7 K/uL   Basophils Relative 1 %   Basophils Absolute 0.1 0 - 0.1 K/uL  Comprehensive metabolic panel  Result Value Ref Range   Sodium 132 (L) 135 - 145 mmol/L   Potassium 3.6 3.5 - 5.1 mmol/L  Chloride 98 (L) 101 - 111 mmol/L   CO2 23 22 - 32 mmol/L   Glucose, Bld 105 (H) 65 - 99 mg/dL   BUN 14 6 - 20 mg/dL   Creatinine, Ser 0.77 0.44 - 1.00 mg/dL   Calcium 8.9 8.9 - 10.3 mg/dL   Total Protein 7.6 6.5 - 8.1 g/dL   Albumin 4.1 3.5 - 5.0 g/dL   AST 23 15 - 41 U/L   ALT 15 14 - 54 U/L   Alkaline Phosphatase 72 38 - 126 U/L   Total Bilirubin 0.4 0.3 - 1.2 mg/dL   GFR calc non Af Amer >60 >60 mL/min   GFR calc Af Amer >60 >60 mL/min   Anion gap 11 5 - 15  Troponin I  Result Value Ref Range   Troponin I <0.03 <0.031 ng/mL  Basic metabolic panel  Result Value Ref Range   Sodium 139 135 - 145 mmol/L   Potassium 3.9 3.5 - 5.1 mmol/L   Chloride 106 101 - 111 mmol/L   CO2 24 22 - 32 mmol/L   Glucose, Bld 101 (H) 65 - 99 mg/dL   BUN 12 6 - 20 mg/dL   Creatinine, Ser 0.71 0.44 - 1.00 mg/dL   Calcium 8.7 (L) 8.9 - 10.3 mg/dL   GFR calc non Af Amer >60 >60 mL/min   GFR calc Af Amer >60 >60 mL/min   Anion gap 9 5 - 15  CBC  Result Value Ref Range    WBC 7.8 3.6 - 11.0 K/uL   RBC 3.61 (L) 3.80 - 5.20 MIL/uL   Hemoglobin 10.4 (L) 12.0 - 16.0 g/dL   HCT 31.2 (L) 35.0 - 47.0 %   MCV 86.4 80.0 - 100.0 fL   MCH 28.7 26.0 - 34.0 pg   MCHC 33.2 32.0 - 36.0 g/dL   RDW 13.0 11.5 - 14.5 %   Platelets 306 150 - 440 K/uL  Vancomycin, trough  Result Value Ref Range   Vancomycin Tr 10 10 - 20 ug/mL  Creatinine, serum  Result Value Ref Range   Creatinine, Ser 0.73 0.44 - 1.00 mg/dL   GFR calc non Af Amer >60 >60 mL/min   GFR calc Af Amer >60 >60 mL/min   She is feeling much better but still exhausted  Doing ok with her abx  F/u blood cultures are all normal /negative   Her husband gives her the abx through picc line -is doing great Nurse changes her dressing   Patient Active Problem List   Diagnosis Date Noted  . Pyrexia   . Gram-positive cocci bacteremia   . Bacteremia   . VSD (ventricular septal defect)   . 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 Fever 11/23/2014  . Acute URI 11/13/2014  . UTI (urinary tract infection) 02/21/2014  . 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 Encounter for Medicare annual wellness exam 02/20/2013  . Routine gynecological examination 02/19/2012  . Carcinoma of lower-inner quadrant of breast ([place]) 12/11/2011  . Mass of right breast 11/25/2011  . PND (post-nasal drip) 12/25/2010  . Heartburn 12/18/2010  . Cough 11/06/2010  . Vitamin D deficiency 04/29/2009  . Hypothyroidism 02/06/2008  . UNSPECIFIED DISEASE OF THYMUS GLAND 01/26/2007  . Hyperlipidemia 01/26/2007  . Essential hypertension 01/26/2007  . Osteopenia 01/26/2007  . BREAST CANCER, HX OF 01/26/2007  . VENTRICULAR SEPTAL DEFECT, HX OF 01/26/2007   Past Medical History  Diagnosis Date  . Hyperlipidemia   . Hypertension   . Thyroid disease     Hypothyroidism  .  Osteopenia   . History of breast cancer   . Overweight(278.02)   . VSD (ventricular septal defect)     does need SBE prophylaxis (Keflex  3 gm 1 hour pprior and 1.5 gm 6 hours post  . Heart  murmur     VSD- echocardiogram - [facility] 10 yrs. ago  . Hypothyroidism   . GERD (gastroesophageal reflux disease)   . Breast cancer ([place])     L breast- 1994- lumpectomy, [facility] at [facility]   . VSD (ventricular septal defect)   . Cataract    Past Surgical History  Procedure Laterality Date  . Breast lumpectomy  1994    Radiation  . Parathyroidectomy  12/2002  . 2d echo  2004    [facility]  . Breast implant removal  1994  . Tonsillectomy      As a child  . Tubal ligation      1976  . Mastectomy  01/08/2012    RIGHT  . Mastectomy w/ sentinel node biopsy  01/08/2012    Procedure: MASTECTOMY WITH SENTINEL LYMPH NODE BIOPSY;  Surgeon: Madilyn Hook, DO;  Location: [place];  Service: General;  Laterality: Right;  right breast mastectomy with sentinel lymph node biopsy    Social History  Substance Use Topics  . Smoking status: Never Smoker   . Smokeless tobacco: Never Used  . Alcohol Use: 0.0 oz/week    0 Standard drinks or equivalent per week     Comment: 1 glass of wine occ   Family History  Problem Relation Age of Onset  . Aneurysm Mother     brain  . Cancer Father     Lung, Liver mets  . Lung cancer Father   . Heart disease Other   . Cancer Cousin     Breast CA  . Breast cancer Cousin   . Breast cancer Maternal Aunt 55   Allergies  Allergen Reactions  . Codeine Other (See Comments)    Reaction:  Severe headaches   . Penicillins Hives and Other (See Comments)    Has patient had a PCN reaction causing immediate rash, facial/tongue/throat swelling, SOB or lightheadedness with hypotension: No Has patient had a PCN reaction causing severe rash involving mucus membranes or skin necrosis: No Has patient had a PCN reaction that required hospitalization No Has patient had a PCN reaction occurring within the last 10 years: No If all of the above answers are "NO", then may proceed with Cephalosporin use.  . Adhesive [Tape] Rash   Current Outpatient Prescriptions on File Prior  to Visit  Medication Sig Dispense Refill  . acetaminophen (TYLENOL) 500 MG tablet Take 500 mg by mouth every 4 (four) hours as needed for fever.    Marland Kitchen alendronate (FOSAMAX) 70 MG tablet take 1 tablet by mouth every week on an empty stomach with 6-8 oz of water. No food / meds for 30 min. Remain Upright (Patient taking differently: Take 70 mg by mouth once a week. Pt takes on Sunday.) 4 tablet 11  . amLODipine (NORVASC) 5 MG tablet Take 1 tablet (5 mg total) by mouth daily. 30 tablet 11  . anastrozole (ARIMIDEX) 1 MG tablet Take 1 tablet (1 mg total) by mouth daily. 90 tablet 2  . benzonatate (TESSALON) 200 MG capsule Take 1 capsule (200 mg total) by mouth 3 (three) times daily as needed for cough (swallow whole, do not bite pill). 30 capsule 0  . cefTRIAXone 2 g in dextrose 5 % 50 mL  Inject 2 g into the vein daily. 37 Dose 0  . levothyroxine (SYNTHROID, LEVOTHROID) 75 MCG tablet Take 1 tablet (75 mcg total) by mouth daily before breakfast. 30 tablet 11  . nystatin (MYCOSTATIN) 100000 UNIT/ML suspension 5 mL swish and swallow three times daily 120 mL 0  . rosuvastatin (CRESTOR) 10 MG tablet Take 1 tablet (10 mg total) by mouth daily. 30 tablet 11   No current facility-administered medications on file prior to visit.    Review of Systems Review of Systems  Constitutional: Negative for fever, appetite change, and unexpected weight change. pos for fatigue that is improving  Eyes: Negative for pain and visual disturbance.  Respiratory: Negative for cough and shortness of breath.  neg for PND/orthopnea or pedal edema  Cardiovascular: Negative for cp or palpitations    Gastrointestinal: Negative for nausea, diarrhea and constipation.  Genitourinary: Negative for urgency and frequency.  Skin: Negative for pallor or rash   Neurological: Negative for weakness, light-headedness, numbness and headaches.  Hematological: Negative for adenopathy. Does not bruise/bleed easily.  Psychiatric/Behavioral:  Negative for dysphoric mood. The patient is not nervous/anxious.         Objective:   Physical Exam  Constitutional: She appears well-developed and well-nourished. No distress.  Well appearing   HENT:  Head: Normocephalic and atraumatic.  Mouth/Throat: Oropharynx is clear and moist.  Eyes: Conjunctivae and EOM are normal. Pupils are equal, round, and reactive to light.  Neck: Normal range of motion. Neck supple. No JVD present. Carotid bruit is not present. No thyromegaly present.  Cardiovascular: Normal rate, regular rhythm and intact distal pulses.  Exam reveals no gallop.   Murmur heard. Pulmonary/Chest: Effort normal and breath sounds normal. No respiratory distress. She has no wheezes. She has no rales.  No crackles  Abdominal: Soft. Bowel sounds are normal. She exhibits no distension, no abdominal bruit and no mass. There is no tenderness.  Musculoskeletal: She exhibits no edema.  Lymphadenopathy:    She has no cervical adenopathy.  Neurological: She is alert. She has normal reflexes.  Skin: Skin is warm and dry. No rash noted.  picc line L arm -intact and nl appearing   Psychiatric: She has a normal mood and affect.          Assessment & Plan:   Problem List Items Addressed This Visit      Cardiovascular and Mediastinum   Endocarditis    In pt with VSD Rev hosp records/labs/studies incl TEE Initially bacteremia with viridans strep - after tx with IV vanco and current IV rocephin- blood cx are negative  She will continue tx with rocephin through picc line for total of 4-6 wk F/u with ID as planned  Will update if symptomatic- doing much better / inc her activity gradually as tolerated       VSD (ventricular septal defect)    With recent endocarditis suspect from dental issue (poss from tooth abscess before treatment)  No problems before this or symptoms  Will watch closely-rev cardiol rep from the hospital  May f/u in the future if problems Doing well now          Other   Gram-positive cocci bacteremia - Primary    Viridans strep/endocarditis Much imp  BC neg now  On rocephin via PICC f/by ID

## 2014-12-07 NOTE — Progress Notes (Signed)
Pre visit review using our clinic review tool, if applicable. No additional management support is needed unless otherwise documented below in the visit note. 

## 2014-12-09 NOTE — Assessment & Plan Note (Signed)
Viridans strep/endocarditis Much imp  BC neg now  On rocephin via PICC f/by ID

## 2014-12-09 NOTE — Assessment & Plan Note (Signed)
With recent endocarditis suspect from dental issue (poss from tooth abscess before treatment)  No problems before this or symptoms  Will watch closely-rev cardiol rep from the hospital  May f/u in the future if problems Doing well now

## 2014-12-09 NOTE — Assessment & Plan Note (Signed)
In pt with VSD Rev hosp records/labs/studies incl TEE Initially bacteremia with viridans strep - after tx with IV vanco and current IV rocephin- blood cx are negative  She will continue tx with rocephin through picc line for total of 4-6 wk F/u with ID as planned  Will update if symptomatic- doing much better / inc her activity gradually as tolerated

## 2014-12-14 DIAGNOSIS — Z8679 Personal history of other diseases of the circulatory system: Secondary | ICD-10-CM | POA: Insufficient documentation

## 2015-01-13 ENCOUNTER — Encounter: Payer: Self-pay | Admitting: Infectious Diseases

## 2015-01-22 ENCOUNTER — Encounter: Payer: Self-pay | Admitting: Cardiovascular Disease

## 2015-01-22 ENCOUNTER — Ambulatory Visit (INDEPENDENT_AMBULATORY_CARE_PROVIDER_SITE_OTHER): Payer: Medicare Other | Admitting: Cardiovascular Disease

## 2015-01-22 VITALS — BP 138/70 | HR 74 | Ht 60.0 in | Wt 122.5 lb

## 2015-01-22 DIAGNOSIS — I38 Endocarditis, valve unspecified: Secondary | ICD-10-CM | POA: Diagnosis not present

## 2015-01-22 DIAGNOSIS — I10 Essential (primary) hypertension: Secondary | ICD-10-CM

## 2015-01-22 DIAGNOSIS — Q210 Ventricular septal defect: Secondary | ICD-10-CM | POA: Diagnosis not present

## 2015-01-22 DIAGNOSIS — R0602 Shortness of breath: Secondary | ICD-10-CM

## 2015-01-22 DIAGNOSIS — E785 Hyperlipidemia, unspecified: Secondary | ICD-10-CM

## 2015-01-22 DIAGNOSIS — C50319 Malignant neoplasm of lower-inner quadrant of unspecified female breast: Secondary | ICD-10-CM

## 2015-01-22 NOTE — Assessment & Plan Note (Signed)
Encouraged her to stay on her Crestor No known coronary artery disease

## 2015-01-22 NOTE — Assessment & Plan Note (Signed)
We did discuss her radiation exposure on the left. This can accelerate underlying coronary artery disease. No indication of CAD at this time  Recommended she call us for any shortness of breath or chest pain symptoms concerning for angina

## 2015-01-22 NOTE — Assessment & Plan Note (Signed)
She has completed treatment for 6 weeks Currently with no symptoms of fever or malaise Recommend she call our office if she has any recurrent symptoms

## 2015-01-22 NOTE — Assessment & Plan Note (Signed)
Small VSD on transesophageal echo, consistent with her murmur Asymptomatic, no elevated right heart pressures or right ventricle dilation We'll monitor with periodic echocardiogram, does not need to be every year Suggested she call us for any new symptoms such as shortness of breath or leg edema Suggested she could take low-dose aspirin though not compelling evidence No history of TIA or stroke

## 2015-01-22 NOTE — Patient Instructions (Signed)
You are doing well. No medication changes were made.  Please call us if you have new issues that need to be addressed before your next appt.  Your physician wants you to follow-up in: 12 months.  You will receive a reminder letter in the mail two months in advance. If you don't receive a letter, please call our office to schedule the follow-up appointment. 

## 2015-01-22 NOTE — Progress Notes (Signed)
Patient ID: Madison Huerta, female    DOB: 11/04/1940, 74 y.o.   MRN: [id number]  HPI Comments: Ms.  Weingart is a pleasant 74 year old woman with history of breast cancer, radiation 20 years ago, recent admission to the hospital October 2016 for malaise, fevers, diagnosed with endocarditis treated with ABX through PICC line for 6 weeks under the guidance of Dr. Ola Spurr, also with small VSD noted on transesophageal echo. She presented to establish care in the [place] office in for follow-up of her endocarditis  In general she reports that she feels well, denies any fevers concerning for recurrent infection. 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 Codeine Other (See Comments)    Reaction:  Severe headaches   . Penicillins Hives and Other (See Comments)    Has patient had a PCN reaction causing immediate rash, facial/tongue/throat swelling, SOB or lightheadedness with hypotension: No Has patient had a PCN reaction causing severe rash involving mucus membranes or skin necrosis: No Has patient had a PCN reaction that required hospitalization No Has patient had a PCN reaction occurring within the last 10 years: No If all of the above answers are "NO", then may proceed with Cephalosporin  use.  . Adhesive [Tape] Rash    Current Outpatient Prescriptions on File Prior to Visit  Medication Sig Dispense Refill  . acetaminophen (TYLENOL) 500 MG tablet Take 500 mg by mouth every 4 (four) hours as needed for fever.    Marland Kitchen alendronate (FOSAMAX) 70 MG tablet take 1 tablet by mouth every week on an empty stomach with 6-8 oz of water. No food / meds for 30 min. Remain Upright (Patient taking differently: Take 70 mg by mouth once a week. Pt takes on Sunday.) 4 tablet 11  . amLODipine (NORVASC) 5 MG tablet Take 1 tablet (5 mg total) by mouth daily. 30 tablet 11  . anastrozole (ARIMIDEX) 1 MG tablet Take 1 tablet (1 mg total) by mouth daily. 90 tablet 2  . levothyroxine (SYNTHROID, LEVOTHROID) 75 MCG tablet Take 1 tablet (75 mcg total) by mouth daily before breakfast. 30 tablet 11  . rosuvastatin (CRESTOR) 10 MG tablet Take 1 tablet (10 mg total) by mouth daily. 30 tablet 11   No current facility-administered medications on file prior to visit.    Past Medical History  Diagnosis Date  . Hyperlipidemia   . Hypertension   . Thyroid disease     Hypothyroidism  . Osteopenia   . History of breast cancer   . Overweight(278.02)   . VSD (ventricular septal defect)     does need SBE prophylaxis (Keflex  3 gm 1 hour pprior and 1.5 gm 6 hours post  . Heart murmur     VSD- echocardiogram - [facility] 10 yrs. ago  . Hypothyroidism   .  GERD (gastroesophageal reflux disease)   . Breast cancer ([place])     L breast- 1994- lumpectomy, [facility]   . VSD (ventricular septal defect)   . Cataract     Past Surgical History  Procedure Laterality Date  . Breast lumpectomy  1994    Radiation  . Parathyroidectomy  12/2002  . 2d echo  2004    Normal at [facility]  . Breast implant removal  1994  . Tonsillectomy      As a child  . Tubal ligation      1976  . Mastectomy  01/08/2012    RIGHT  . Mastectomy w/ sentinel node biopsy  01/08/2012    Procedure: MASTECTOMY WITH SENTINEL LYMPH NODE BIOPSY;   Surgeon: Madilyn Hook, DO;  Location: [place];  Service: General;  Laterality: Right;  right breast mastectomy with sentinel lymph node biopsy     Social History  reports that she has never smoked. She has never used smokeless tobacco. She reports that she drinks alcohol. She reports that she does not use illicit drugs.  Family History family history includes Aneurysm in her mother; Breast cancer in her cousin; Breast cancer (age of onset: 3) in her maternal aunt; Cancer in her cousin and father; Heart disease in her other; Lung cancer in her father.   Review of Systems  Constitutional: Negative.   HENT: Negative.   Eyes: Negative.   Respiratory: Negative.   Cardiovascular: Negative.   Gastrointestinal: Negative.   Endocrine: Negative.   Musculoskeletal: Negative.   Skin: Negative.   Allergic/Immunologic: Negative.   Neurological: Negative.   Hematological: Negative.   Psychiatric/Behavioral: Negative.   All other systems reviewed and are negative.   BP 138/70 mmHg  Pulse 74  Ht 5' (1.524 m)  Wt 122 lb 8 oz (55.566 kg)  BMI 23.92 kg/m2  Physical Exam  Constitutional: She is oriented to person, place, and time. She appears well-developed and well-nourished.  HENT:  Head: Normocephalic.  Nose: Nose normal.  Mouth/Throat: Oropharynx is clear and moist.  Eyes: Conjunctivae are normal. Pupils are equal, round, and reactive to light.  Neck: Normal range of motion. Neck supple. No JVD present.  Cardiovascular: Normal rate, regular rhythm and intact distal pulses.  Exam reveals no gallop and no friction rub.   Murmur heard.  Systolic murmur is present with a grade of 2/6  Pulmonary/Chest: Effort normal and breath sounds normal. No respiratory distress. She has no wheezes. She has no rales. She exhibits no tenderness.  Abdominal: Soft. Bowel sounds are normal. She exhibits no distension. There is no tenderness.  Musculoskeletal: Normal range of motion. She exhibits no edema or  tenderness.  Lymphadenopathy:    She has no cervical adenopathy.  Neurological: She is alert and oriented to person, place, and time. Coordination normal.  Skin: Skin is warm and dry. No rash noted. No erythema.  Psychiatric: She has a normal mood and affect. Her behavior is normal. Judgment and thought content normal.

## 2015-01-22 NOTE — Assessment & Plan Note (Signed)
Blood pressure is well controlled on today's visit. No changes made to the medications. 

## 2015-01-24 ENCOUNTER — Other Ambulatory Visit: Payer: Self-pay | Admitting: Family Medicine

## 2015-02-11 ENCOUNTER — Telehealth: Payer: Self-pay | Admitting: Family Medicine

## 2015-02-11 DIAGNOSIS — I10 Essential (primary) hypertension: Secondary | ICD-10-CM

## 2015-02-11 DIAGNOSIS — E559 Vitamin D deficiency, unspecified: Secondary | ICD-10-CM

## 2015-02-11 DIAGNOSIS — E039 Hypothyroidism, unspecified: Secondary | ICD-10-CM

## 2015-02-11 NOTE — Telephone Encounter (Signed)
-----   Message from Ellamae Sia sent at 02/07/2015 12:02 PM EST ----- Regarding: Lab orders for Tuesday,12.27.16 Patient is scheduled for CPX labs, please order future labs, Thanks , Karna Christmas

## 2015-02-19 ENCOUNTER — Other Ambulatory Visit (INDEPENDENT_AMBULATORY_CARE_PROVIDER_SITE_OTHER): Payer: Medicare Other

## 2015-02-19 DIAGNOSIS — E039 Hypothyroidism, unspecified: Secondary | ICD-10-CM | POA: Diagnosis not present

## 2015-02-19 DIAGNOSIS — I10 Essential (primary) hypertension: Secondary | ICD-10-CM | POA: Diagnosis not present

## 2015-02-19 DIAGNOSIS — E559 Vitamin D deficiency, unspecified: Secondary | ICD-10-CM | POA: Diagnosis not present

## 2015-02-19 LAB — COMPREHENSIVE METABOLIC PANEL
ALBUMIN: 4.4 g/dL (ref 3.5–5.2)
ALK PHOS: 65 U/L (ref 39–117)
ALT: 15 U/L (ref 0–35)
AST: 25 U/L (ref 0–37)
BUN: 22 mg/dL (ref 6–23)
CALCIUM: 9.8 mg/dL (ref 8.4–10.5)
CO2: 30 mEq/L (ref 19–32)
CREATININE: 0.85 mg/dL (ref 0.40–1.20)
Chloride: 106 mEq/L (ref 96–112)
GFR: 69.74 mL/min (ref >60.00)
Glucose, Bld: 99 mg/dL (ref 70–99)
Potassium: 4.1 mEq/L (ref 3.5–5.1)
SODIUM: 144 meq/L (ref 135–145)
TOTAL PROTEIN: 6.8 g/dL (ref 6.0–8.3)
Total Bilirubin: 0.6 mg/dL (ref 0.2–1.2)

## 2015-02-19 LAB — CBC WITH DIFFERENTIAL/PLATELET
BASOS PCT: 1 % (ref 0.0–3.0)
Basophils Absolute: 0.1 10*3/uL (ref 0.0–0.1)
EOS PCT: 2.5 % (ref 0.0–5.0)
Eosinophils Absolute: 0.1 10*3/uL (ref 0.0–0.7)
HEMATOCRIT: 41.3 % (ref 36.0–46.0)
HEMOGLOBIN: 13.6 g/dL (ref 12.0–15.0)
LYMPHS PCT: 35.1 % (ref 12.0–46.0)
Lymphs Abs: 1.9 10*3/uL (ref 0.7–4.0)
MCHC: 32.9 g/dL (ref 30.0–36.0)
MCV: 85.4 fl (ref 78.0–100.0)
MONO ABS: 0.5 10*3/uL (ref 0.1–1.0)
Monocytes Relative: 9.9 % (ref 3.0–12.0)
Neutro Abs: 2.8 10*3/uL (ref 1.4–7.7)
Neutrophils Relative %: 51.5 % (ref 43.0–77.0)
Platelets: 278 10*3/uL (ref 150.0–400.0)
RBC: 4.84 Mil/uL (ref 3.87–5.11)
RDW: 15.3 % (ref 11.5–15.5)
WBC: 5.4 10*3/uL (ref 4.0–10.5)

## 2015-02-19 LAB — LIPID PANEL
CHOLESTEROL: 193 mg/dL (ref 0–200)
HDL: 78.1 mg/dL (ref >39.00)
LDL Cholesterol: 94 mg/dL (ref 0–99)
NonHDL: 114.74
Total CHOL/HDL Ratio: 2
Triglycerides: 106 mg/dL (ref 0.0–149.0)
VLDL: 21.2 mg/dL (ref 0.0–40.0)

## 2015-02-19 LAB — VITAMIN D 25 HYDROXY (VIT D DEFICIENCY, FRACTURES): VITD: 35.9 ng/mL (ref 30.00–100.00)

## 2015-02-19 LAB — TSH: TSH: 0.07 u[IU]/mL — AB (ref 0.35–4.50)

## 2015-02-26 ENCOUNTER — Encounter: Payer: Self-pay | Admitting: Family Medicine

## 2015-02-26 ENCOUNTER — Ambulatory Visit (INDEPENDENT_AMBULATORY_CARE_PROVIDER_SITE_OTHER): Payer: Medicare Other | Admitting: Family Medicine

## 2015-02-26 ENCOUNTER — Other Ambulatory Visit (HOSPITAL_COMMUNITY)
Admission: RE | Admit: 2015-02-26 | Discharge: 2015-02-26 | Disposition: A | Discharge status: Home / Self Care | Payer: Medicare Other | Source: Ambulatory Visit | Attending: Family Medicine | Admitting: Family Medicine

## 2015-02-26 VITALS — BP 130/68 | HR 75 | Temp 97.7°F | Ht 60.0 in | Wt 121.5 lb

## 2015-02-26 DIAGNOSIS — Z01419 Encounter for gynecological examination (general) (routine) without abnormal findings: Secondary | ICD-10-CM

## 2015-02-26 DIAGNOSIS — Z0000 Encounter for general adult medical examination without abnormal findings: Secondary | ICD-10-CM | POA: Insufficient documentation

## 2015-02-26 DIAGNOSIS — E559 Vitamin D deficiency, unspecified: Secondary | ICD-10-CM

## 2015-02-26 DIAGNOSIS — E039 Hypothyroidism, unspecified: Secondary | ICD-10-CM

## 2015-02-26 DIAGNOSIS — M8580 Other specified disorders of bone density and structure, unspecified site: Secondary | ICD-10-CM

## 2015-02-26 DIAGNOSIS — Z124 Encounter for screening for malignant neoplasm of cervix: Secondary | ICD-10-CM | POA: Insufficient documentation

## 2015-02-26 DIAGNOSIS — I10 Essential (primary) hypertension: Secondary | ICD-10-CM

## 2015-02-26 DIAGNOSIS — E785 Hyperlipidemia, unspecified: Secondary | ICD-10-CM

## 2015-02-26 HISTORY — DX: Encounter for gynecological examination (general) (routine) without abnormal findings: Z01.419

## 2015-02-26 MED ORDER — ROSUVASTATIN CALCIUM 10 MG PO TABS: 10.0000 mg | ORAL_TABLET | Freq: Every day | ORAL | Status: DC

## 2015-02-26 MED ORDER — LEVOTHYROXINE SODIUM 50 MCG PO TABS: 50.0000 ug | ORAL_TABLET | Freq: Every day | ORAL | Status: DC

## 2015-02-26 NOTE — Progress Notes (Signed)
Subjective:    Patient ID: Madison Huerta, female    DOB: 07/29/1940, 75 y.o.   MRN: [id number]  HPI Here for annual medicare wellness visit as well as chronic/acute medical problems including annual preventative exam  I have personally reviewed the Medicare Annual Wellness questionnaire and have noted 1. The patient's medical and social history 2. Their use of alcohol, tobacco or illicit drugs 3. Their current medications and supplements 4. The patient's functional ability including ADL's, fall risks, home safety risks and hearing or visual             impairment. 5. Diet and physical activities 6. Evidence for depression or mood disorders  The patients weight, height, BMI have been recorded in the chart and visual acuity is per eye clinic.  I have made referrals, counseling and provided education to the patient based review of the above and I have provided the pt with a written personalized care plan for preventive services. Reviewed and updated provider list, see scanned forms.  See scanned forms.  Routine anticipatory guidance given to patient.  See health maintenance. Colon cancer screening 7/13 - 5 year recall  Breast cancer screening 9/16 -ok  (has 6 mo f/u - oncol/ had a mastectomy)-on arimidex (stopped for a while when she was sick) Self breast exam-no changes/ no lumps  Gyn exam was 12/13 -still has a uterus  Flu vaccine 10/16 Tetanus vaccine 12/12 Pneumovax 12/15 - complete on both  Zoster vaccine 12/09 dexa 9/16 - mild osteopenia -on fosamax right now - going fine  Taking ca and D , no falls or fractures and she does exercise  Advance directive -has a living will and power of attorney  Cognitive function addressed- see scanned forms- and if abnormal then additional documentation follows. - thinks she is the same as everyone else - occ word finding , not worried   PMH and SH reviewed  Meds, vitals, and allergies reviewed.   ROS: See HPI.  Otherwise negative.    D  35.9 level Taking 1000 iu daily   tsh is low  Lab Results  Component Value Date   TSH 0.07* 02/19/2015   no symptoms for the most part  Was in the hospital for endocarditis -had it at a different time of day  She now takes it before she eats in am  Is sleeping better now   Results for orders placed or performed in visit on 02/19/15  CBC with Differential/Platelet  Result Value Ref Range   WBC 5.4 4.0 - 10.5 K/uL   RBC 4.84 3.87 - 5.11 Mil/uL   Hemoglobin 13.6 12.0 - 15.0 g/dL   HCT 41.3 36.0 - 46.0 %   MCV 85.4 78.0 - 100.0 fl   MCHC 32.9 30.0 - 36.0 g/dL   RDW 15.3 11.5 - 15.5 %   Platelets 278.0 150.0 - 400.0 K/uL   Neutrophils Relative % 51.5 43.0 - 77.0 %   Lymphocytes Relative 35.1 12.0 - 46.0 %   Monocytes Relative 9.9 3.0 - 12.0 %   Eosinophils Relative 2.5 0.0 - 5.0 %   Basophils Relative 1.0 0.0 - 3.0 %   Neutro Abs 2.8 1.4 - 7.7 K/uL   Lymphs Abs 1.9 0.7 - 4.0 K/uL   Monocytes Absolute 0.5 0.1 - 1.0 K/uL   Eosinophils Absolute 0.1 0.0 - 0.7 K/uL   Basophils Absolute 0.1 0.0 - 0.1 K/uL  Comprehensive metabolic panel  Result Value Ref Range   Sodium 144 135 - 145  mEq/L   Potassium 4.1 3.5 - 5.1 mEq/L   Chloride 106 96 - 112 mEq/L   CO2 30 19 - 32 mEq/L   Glucose, Bld 99 70 - 99 mg/dL   BUN 22 6 - 23 mg/dL   Creatinine, Ser 0.85 0.40 - 1.20 mg/dL   Total Bilirubin 0.6 0.2 - 1.2 mg/dL   Alkaline Phosphatase 65 39 - 117 U/L   AST 25 0 - 37 U/L   ALT 15 0 - 35 U/L   Total Protein 6.8 6.0 - 8.3 g/dL   Albumin 4.4 3.5 - 5.2 g/dL   Calcium 9.8 8.4 - 10.5 mg/dL   GFR 69.74 >60.00 mL/min  Lipid panel  Result Value Ref Range   Cholesterol 193 0 - 200 mg/dL   Triglycerides 106.0 0.0 - 149.0 mg/dL   HDL 78.10 >39.00 mg/dL   VLDL 21.2 0.0 - 40.0 mg/dL   LDL Cholesterol 94 0 - 99 mg/dL   Total CHOL/HDL Ratio 2    NonHDL 114.74   TSH  Result Value Ref Range   TSH 0.07 (L) 0.35 - 4.50 uIU/mL  VITAMIN D 25 Hydroxy (Vit-D Deficiency, Fractures)  Result Value Ref  Range   VITD 35.90 30.00 - 100.00 ng/mL        Patient Active Problem List   Diagnosis Date Noted  . Routine general medical examination at a health care facility 02/26/2015  . Encounter for routine gynecological examination 02/26/2015  . Pyrexia   . Gram-positive cocci bacteremia   . Bacteremia   . VSD (ventricular septal defect)   . 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 Fever 11/23/2014  . Acute URI 11/13/2014  . UTI (urinary tract infection) 02/21/2014  . 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 Encounter for Medicare annual wellness exam 02/20/2013  . Routine gynecological examination 02/19/2012  . Carcinoma of lower-inner quadrant of breast ([place]) 12/11/2011  . Mass of right breast 11/25/2011  . PND (post-nasal drip) 12/25/2010  . Heartburn 12/18/2010  . Cough 11/06/2010  . Vitamin D deficiency 04/29/2009  . Hypothyroidism 02/06/2008  . UNSPECIFIED DISEASE OF THYMUS GLAND 01/26/2007  . Hyperlipidemia 01/26/2007  . Essential hypertension 01/26/2007  . Osteopenia 01/26/2007  . BREAST CANCER, HX OF 01/26/2007  . VENTRICULAR SEPTAL DEFECT, HX OF 01/26/2007   Past Medical History  Diagnosis Date  . Hyperlipidemia   . Hypertension   . Thyroid disease     Hypothyroidism  . Osteopenia   . History of breast cancer   . Overweight(278.02)   . VSD (ventricular septal defect)     does need SBE prophylaxis (Keflex  3 gm 1 hour pprior and 1.5 gm 6 hours post  . Heart murmur     VSD- echocardiogram - [facility] 10 yrs. ago  . Hypothyroidism   . GERD (gastroesophageal reflux disease)   . Breast cancer ([place])     L breast- 1994- lumpectomy, care [facility] at [facility]   . VSD (ventricular septal defect)   . Cataract    Past Surgical History  Procedure Laterality Date  . Breast lumpectomy  1994    Radiation  . Parathyroidectomy  12/2002  . 2d echo  2004    Normal at [facility]  . Breast implant removal  1994  . Tonsillectomy      As a child  . Tubal ligation      1976  . Mastectomy  01/08/2012    RIGHT  .  Mastectomy w/ sentinel node biopsy  01/08/2012    Procedure: MASTECTOMY WITH SENTINEL LYMPH  NODE BIOPSY;  Surgeon: Madilyn Hook, DO;  Location: [facility];  Service: General;  Laterality: Right;  right breast mastectomy with sentinel lymph node biopsy    Social History  Substance Use Topics  . Smoking status: Never Smoker   . Smokeless tobacco: Never Used  . Alcohol Use: 0.0 oz/week    0 Standard drinks or equivalent per week     Comment: 1 glass of wine occ   Family History  Problem Relation Age of Onset  . Aneurysm Mother     brain  . Cancer Father     Lung, Liver mets  . Lung cancer Father   . Heart disease Other   . Cancer Cousin     Breast CA  . Breast cancer Cousin   . Breast cancer Maternal Aunt 55   Allergies  Allergen Reactions  . Codeine Other (See Comments)    Reaction:  Severe headaches   . Penicillins Hives and Other (See Comments)    Has patient had a PCN reaction causing immediate rash, facial/tongue/throat swelling, SOB or lightheadedness with hypotension: No Has patient had a PCN reaction causing severe rash involving mucus membranes or skin necrosis: No Has patient had a PCN reaction that required hospitalization No Has patient had a PCN reaction occurring within the last 10 years: No If all of the above answers are "NO", then may proceed with Cephalosporin use.  . Adhesive [Tape] Rash   Current Outpatient Prescriptions on File Prior to Visit  Medication Sig Dispense Refill  . acetaminophen (TYLENOL) 500 MG tablet Take 500 mg by mouth every 4 (four) hours as needed for fever.    Marland Kitchen amLODipine (NORVASC) 5 MG tablet Take 1 tablet (5 mg total) by mouth daily. 30 tablet 11  . anastrozole (ARIMIDEX) 1 MG tablet Take 1 tablet (1 mg total) by mouth daily. 90 tablet 2  . alendronate (FOSAMAX) 70 MG tablet take 1 tablet by mouth every week on an empty stomach with 6-8 oz of water. No food / meds for 30 min. Remain Upright (Patient not taking: Reported on 02/26/2015) 4  tablet 0   No current facility-administered medications on file prior to visit.    Review of Systems    Review of Systems  Constitutional: Negative for fever, appetite change, fatigue and unexpected weight change.  Eyes: Negative for pain and visual disturbance.  Respiratory: Negative for cough and shortness of breath.   Cardiovascular: Negative for cp or palpitations    Gastrointestinal: Negative for nausea, diarrhea and constipation.  Genitourinary: Negative for urgency and frequency.  Skin: Negative for pallor or rash   Neurological: Negative for weakness, light-headedness, numbness and headaches.  Hematological: Negative for adenopathy. Does not bruise/bleed easily.  Psychiatric/Behavioral: Negative for dysphoric mood. The patient is not nervous/anxious.      Objective:   Physical Exam  Constitutional: She appears well-developed and well-nourished. No distress.  Well appearing   HENT:  Head: Normocephalic and atraumatic.  Right Ear: External ear normal.  Left Ear: External ear normal.  Mouth/Throat: Oropharynx is clear and moist.  Eyes: Conjunctivae and EOM are normal. Pupils are equal, round, and reactive to light. No scleral icterus.  Neck: Normal range of motion. Neck supple. No JVD present. Carotid bruit is not present. No thyromegaly present.  Cardiovascular: Normal rate, regular rhythm, normal heart sounds and intact distal pulses.  Exam reveals no gallop.   Pulmonary/Chest: Effort normal and breath sounds normal. No respiratory distress. She has no wheezes. She exhibits no tenderness.  Abdominal: Soft. Bowel sounds are normal. She exhibits no distension, no abdominal bruit and no mass. There is no tenderness.  Genitourinary: Vagina normal and uterus normal. No breast swelling, tenderness, discharge or bleeding. No vaginal discharge found.  Breast exam: No mass, nodules, thickening, tenderness, bulging, retraction, inflamation, nipple discharge or skin changes noted.  No  axillary or clavicular LA.     o External genitalia nl appearing o Urethral meatus nl appearing o Urethra  Nl and mobile o Bladder nt o Vagina mild atrophy o Cervix  Not friable/no polyps o Uterus  Nl/not enl or fixed o Adnexa/parametria nl  o Anus and perineum nl appearing    Musculoskeletal: Normal range of motion. She exhibits no edema or tenderness.  Lymphadenopathy:    She has no cervical adenopathy.  Neurological: She is alert. She has normal reflexes. No cranial nerve deficit. She exhibits normal muscle tone. Coordination normal.  Skin: Skin is warm and dry. No rash noted. No erythema. No pallor.  Psychiatric: She has a normal mood and affect.          Assessment & Plan:   Problem List Items Addressed This Visit      Cardiovascular and Mediastinum   Essential hypertension - Primary    bp in fair control at this time  BP Readings from Last 1 Encounters:  02/26/15 130/68   No changes needed Disc lifstyle change with low sodium diet and exercise  Labs reviewed       Relevant Medications   rosuvastatin (CRESTOR) 10 MG tablet     Endocrine   Hypothyroidism    tsh low Lab Results  Component Value Date   TSH 0.07* 02/19/2015   Disc way to take her levothyroxine Will cut dose to 50 mcg daily and re check 6 wk  No symptoms       Relevant Medications   levothyroxine (SYNTHROID, LEVOTHROID) 50 MCG tablet     Musculoskeletal and Integument   Osteopenia    dexa utd from 12/15 No falls or fx On fosamax with no problems Disc need for calcium/ vitamin D/ wt bearing exercise and bone density test every 2 y to monitor Disc safety/ fracture risk in detail          Other   Encounter for Medicare annual wellness exam    Reviewed health habits including diet and exercise and skin cancer prevention Reviewed appropriate screening tests for age  Also reviewed health mt list, fam hx and immunization status , as well as social and family history   See [facility]  reviewed If you need a referral for allergist (for penicillin) please let us know  Increase your vitamin D to 2000 iu daily  Decrease thyroid dose to 50 mcg daily  Schedule non fasting labs for 6 weeks approx       Encounter for routine gynecological examination    In light of arimidex No symptoms Pap sent       Relevant Orders   Cytology - PAP   Hyperlipidemia    Disc goals for lipids and reasons to control them Rev labs with pt Rev low sat fat diet in detail On crestor-will contineu      Relevant Medications   rosuvastatin (CRESTOR) 10 MG tablet   Routine general medical examination at a health care facility    Reviewed health habits including diet and exercise and skin cancer prevention Reviewed appropriate screening tests for age  Also reviewed health mt list, fam hx and immunization  status , as well as social and family history   See [facility] reviewed If you need a referral for allergist (for penicillin) please let us know  Increase your vitamin D to 2000 iu daily  Decrease thyroid dose to 50 mcg daily  Schedule non fasting labs for 6 weeks approx  Take care of yourself       Vitamin D deficiency    D level is 35.9-would pref closer to 50  Disc imp of this to bone and overall health Will inc to 2000 iu daily and continue to follow

## 2015-02-26 NOTE — Assessment & Plan Note (Signed)
Reviewed health habits including diet and exercise and skin cancer prevention Reviewed appropriate screening tests for age  Also reviewed health mt list, fam hx and immunization status , as well as social and family history   See [facility] reviewed If you need a referral for allergist (for penicillin) please let us know  Increase your vitamin D to 2000 iu daily  Decrease thyroid dose to 50 mcg daily  Schedule non fasting labs for 6 weeks approx

## 2015-02-26 NOTE — Assessment & Plan Note (Signed)
bp in fair control at this time  BP Readings from Last 1 Encounters:  02/26/15 130/68   No changes needed Disc lifstyle change with low sodium diet and exercise  Labs reviewed

## 2015-02-26 NOTE — Assessment & Plan Note (Signed)
In light of arimidex No symptoms Pap sent

## 2015-02-26 NOTE — Assessment & Plan Note (Signed)
Reviewed health habits including diet and exercise and skin cancer prevention Reviewed appropriate screening tests for age  Also reviewed health mt list, fam hx and immunization status , as well as social and family history   See [facility] reviewed If you need a referral for allergist (for penicillin) please let us know  Increase your vitamin D to 2000 iu daily  Decrease thyroid dose to 50 mcg daily  Schedule non fasting labs for 6 weeks approx  Take care of yourself

## 2015-02-26 NOTE — Assessment & Plan Note (Addendum)
D level is 35.9-would pref closer to 50  Disc imp of this to bone and overall health Will inc to 2000 iu daily and continue to follow

## 2015-02-26 NOTE — Progress Notes (Signed)
Pre visit review using our clinic review tool, if applicable. No additional management support is needed unless otherwise documented below in the visit note. 

## 2015-02-26 NOTE — Patient Instructions (Signed)
If you need a referral for allergist (for penicillin) please let us know  Increase your vitamin D to 2000 iu daily  Decrease thyroid dose to 50 mcg daily  Schedule non fasting labs for 6 weeks approx  Take care of yourself

## 2015-02-26 NOTE — Assessment & Plan Note (Signed)
Disc goals for lipids and reasons to control them Rev labs with pt Rev low sat fat diet in detail On crestor-will contineu

## 2015-02-26 NOTE — Assessment & Plan Note (Signed)
tsh low Lab Results  Component Value Date   TSH 0.07* 02/19/2015   Disc way to take her levothyroxine Will cut dose to 50 mcg daily and re check 6 wk  No symptoms

## 2015-02-26 NOTE — Assessment & Plan Note (Signed)
dexa utd from 12/15 No falls or fx On fosamax with no problems Disc need for calcium/ vitamin D/ wt bearing exercise and bone density test every 2 y to monitor Disc safety/ fracture risk in detail

## 2015-02-27 ENCOUNTER — Encounter: Payer: Medicare Other | Admitting: Cardiovascular Disease

## 2015-02-27 DIAGNOSIS — Z452 Encounter for adjustment and management of vascular access device: Secondary | ICD-10-CM | POA: Diagnosis not present

## 2015-02-27 DIAGNOSIS — B9689 Other specified bacterial agents as the cause of diseases classified elsewhere: Secondary | ICD-10-CM | POA: Diagnosis not present

## 2015-02-27 DIAGNOSIS — R7881 Bacteremia: Secondary | ICD-10-CM | POA: Diagnosis not present

## 2015-02-27 DIAGNOSIS — I330 Acute and subacute infective endocarditis: Secondary | ICD-10-CM | POA: Diagnosis not present

## 2015-03-01 LAB — CYTOLOGY - PAP

## 2015-03-06 ENCOUNTER — Telehealth: Payer: Self-pay | Admitting: *Deleted

## 2015-03-06 ENCOUNTER — Other Ambulatory Visit: Payer: Self-pay | Admitting: Family Medicine

## 2015-03-06 DIAGNOSIS — C50312 Malignant neoplasm of lower-inner quadrant of left female breast: Secondary | ICD-10-CM

## 2015-03-06 MED ORDER — ANASTROZOLE 1 MG PO TABS: 1.0000 mg | ORAL_TABLET | Freq: Every day | ORAL | Status: DC

## 2015-03-06 MED ORDER — AMLODIPINE BESYLATE 5 MG PO TABS: 5.0000 mg | ORAL_TABLET | Freq: Every day | ORAL | Status: DC

## 2015-03-06 NOTE — Telephone Encounter (Signed)
Escribed

## 2015-04-09 ENCOUNTER — Other Ambulatory Visit: Payer: Medicare Other

## 2015-04-10 ENCOUNTER — Telehealth: Payer: Self-pay | Admitting: Family Medicine

## 2015-04-10 DIAGNOSIS — E039 Hypothyroidism, unspecified: Secondary | ICD-10-CM

## 2015-04-10 NOTE — Telephone Encounter (Signed)
-----   Message from Ellamae Sia sent at 04/10/2015  6:26 PM EST ----- Regarding: Lab orders for Thursday, 2.16.17 6 week thyroid labs?

## 2015-04-11 ENCOUNTER — Other Ambulatory Visit (INDEPENDENT_AMBULATORY_CARE_PROVIDER_SITE_OTHER): Payer: Medicare Other

## 2015-04-11 DIAGNOSIS — E039 Hypothyroidism, unspecified: Secondary | ICD-10-CM | POA: Diagnosis not present

## 2015-04-11 LAB — TSH: TSH: 3.99 u[IU]/mL (ref 0.35–4.50)

## 2015-04-27 ENCOUNTER — Other Ambulatory Visit: Payer: Self-pay | Admitting: *Deleted

## 2015-04-27 DIAGNOSIS — C50311 Malignant neoplasm of lower-inner quadrant of right female breast: Secondary | ICD-10-CM

## 2015-05-01 ENCOUNTER — Inpatient Hospital Stay (HOSPITAL_BASED_OUTPATIENT_CLINIC_OR_DEPARTMENT_OTHER): Payer: Medicare Other | Admitting: Internal Medicine

## 2015-05-01 ENCOUNTER — Encounter: Payer: Self-pay | Admitting: Internal Medicine

## 2015-05-01 ENCOUNTER — Inpatient Hospital Stay: Payer: Medicare Other | Attending: Internal Medicine

## 2015-05-01 VITALS — BP 186/80 | HR 82 | Temp 98.4°F | Wt 123.7 lb

## 2015-05-01 DIAGNOSIS — E785 Hyperlipidemia, unspecified: Secondary | ICD-10-CM

## 2015-05-01 DIAGNOSIS — Z853 Personal history of malignant neoplasm of breast: Secondary | ICD-10-CM | POA: Diagnosis not present

## 2015-05-01 DIAGNOSIS — Z8042 Family history of malignant neoplasm of prostate: Secondary | ICD-10-CM | POA: Diagnosis not present

## 2015-05-01 DIAGNOSIS — Q210 Ventricular septal defect: Secondary | ICD-10-CM | POA: Diagnosis not present

## 2015-05-01 DIAGNOSIS — Z803 Family history of malignant neoplasm of breast: Secondary | ICD-10-CM | POA: Diagnosis not present

## 2015-05-01 DIAGNOSIS — Z9223 Personal history of estrogen therapy: Secondary | ICD-10-CM | POA: Diagnosis not present

## 2015-05-01 DIAGNOSIS — Z9011 Acquired absence of right breast and nipple: Secondary | ICD-10-CM

## 2015-05-01 DIAGNOSIS — C50311 Malignant neoplasm of lower-inner quadrant of right female breast: Secondary | ICD-10-CM

## 2015-05-01 DIAGNOSIS — Z79811 Long term (current) use of aromatase inhibitors: Secondary | ICD-10-CM | POA: Diagnosis not present

## 2015-05-01 DIAGNOSIS — Z79899 Other long term (current) drug therapy: Secondary | ICD-10-CM | POA: Insufficient documentation

## 2015-05-01 DIAGNOSIS — Z801 Family history of malignant neoplasm of trachea, bronchus and lung: Secondary | ICD-10-CM | POA: Diagnosis not present

## 2015-05-01 DIAGNOSIS — E039 Hypothyroidism, unspecified: Secondary | ICD-10-CM | POA: Insufficient documentation

## 2015-05-01 DIAGNOSIS — I10 Essential (primary) hypertension: Secondary | ICD-10-CM

## 2015-05-01 DIAGNOSIS — C50911 Malignant neoplasm of unspecified site of right female breast: Secondary | ICD-10-CM | POA: Diagnosis not present

## 2015-05-01 DIAGNOSIS — Z923 Personal history of irradiation: Secondary | ICD-10-CM | POA: Insufficient documentation

## 2015-05-01 DIAGNOSIS — Z170 Estrogen receptor positive status [ER+]: Secondary | ICD-10-CM

## 2015-05-01 DIAGNOSIS — C50312 Malignant neoplasm of lower-inner quadrant of left female breast: Secondary | ICD-10-CM

## 2015-05-01 DIAGNOSIS — K219 Gastro-esophageal reflux disease without esophagitis: Secondary | ICD-10-CM | POA: Diagnosis not present

## 2015-05-01 LAB — CBC WITH DIFFERENTIAL/PLATELET
BASOS ABS: 0.1 10*3/uL (ref 0–0.1)
BASOS PCT: 1 %
EOS ABS: 0.1 10*3/uL (ref 0–0.7)
EOS PCT: 2 %
HCT: 40 % (ref 35.0–47.0)
Hemoglobin: 13.5 g/dL (ref 12.0–16.0)
Lymphocytes Relative: 24 %
Lymphs Abs: 1.6 10*3/uL (ref 1.0–3.6)
MCH: 29.5 pg (ref 26.0–34.0)
MCHC: 33.9 g/dL (ref 32.0–36.0)
MCV: 87.2 fL (ref 80.0–100.0)
MONO ABS: 0.5 10*3/uL (ref 0.2–0.9)
Monocytes Relative: 7 %
Neutro Abs: 4.6 10*3/uL (ref 1.4–6.5)
Neutrophils Relative %: 66 %
PLATELETS: 271 10*3/uL (ref 150–440)
RBC: 4.59 MIL/uL (ref 3.80–5.20)
RDW: 15.5 % — AB (ref 11.5–14.5)
WBC: 7 10*3/uL (ref 3.6–11.0)

## 2015-05-01 LAB — COMPREHENSIVE METABOLIC PANEL
ALBUMIN: 4.8 g/dL (ref 3.5–5.0)
ALK PHOS: 64 U/L (ref 38–126)
ALT: 16 U/L (ref 14–54)
ANION GAP: 6 (ref 5–15)
AST: 30 U/L (ref 15–41)
BILIRUBIN TOTAL: 1.1 mg/dL (ref 0.3–1.2)
BUN: 19 mg/dL (ref 6–20)
CALCIUM: 9.1 mg/dL (ref 8.9–10.3)
CO2: 27 mmol/L (ref 22–32)
CREATININE: 0.96 mg/dL (ref 0.44–1.00)
Chloride: 103 mmol/L (ref 101–111)
GFR, EST NON AFRICAN AMERICAN: 58 mL/min — AB (ref >60)
Glucose, Bld: 109 mg/dL — ABNORMAL HIGH (ref 65–99)
Potassium: 3.8 mmol/L (ref 3.5–5.1)
Sodium: 136 mmol/L (ref 135–145)
TOTAL PROTEIN: 7.6 g/dL (ref 6.5–8.1)

## 2015-05-01 NOTE — Progress Notes (Signed)
[place] OFFICE PROGRESS NOTE  Patient Care Team: Abner Greenspan, MD as PCP - General   SUMMARY OF ONCOLOGIC HISTORY:  # 1994- LEFT BREAST CA s/p Lumpec; RT- Tam  # OCT 2013-  RIGHT BREAST CA pT1c [multifocal-1.2cm s/p Mastec] pN0 ER/PR-pos;Her 2Neu-NEG;Dec 2013- Arimidex   # SEP 2016- DEXA scan- low bone/improved [compared to 2014]   INTERVAL HISTORY:  This is my first interaction with the patient since I joined the practice September 2016. I reviewed the patient's prior charts/pertinent labs/imaging in detail; findings are summarized above.   A very pleasant 75 year old female patient with above history of breast cancer- right side stage I ER/PR positive; her 2 Neu-NEG currently on Arimidex is here for follow-up.  Patient denies any new lumps or bumps. Appetite is good. Denies any bone pain. No weight loss. No shortness of breath or cough.  REVIEW OF SYSTEMS:  A complete 10 point review of system is done which is negative except mentioned above/history of present illness.   PAST MEDICAL HISTORY :  Past Medical History  Diagnosis Date  . Hyperlipidemia   . Hypertension   . Thyroid disease     Hypothyroidism  . Osteopenia   . History of breast cancer   . Overweight(278.02)   . VSD (ventricular septal defect)     does need SBE prophylaxis (Keflex  3 gm 1 hour pprior and 1.5 gm 6 hours post  . Heart murmur     VSD- echocardiogram - [facility] 10 yrs. ago  . Hypothyroidism   . GERD (gastroesophageal reflux disease)   . Breast cancer ([place])     L breast- 1994- lumpectomy, [facility]   . VSD (ventricular septal defect)   . Cataract   . Endocarditis     PAST SURGICAL HISTORY :   Past Surgical History  Procedure Laterality Date  . Breast lumpectomy  1994    Radiation  . Parathyroidectomy  12/2002  . 2d echo  2004    Normal at [facility]  . Breast implant removal  1994  . Tonsillectomy      As a child  . Tubal ligation      1976  . Mastectomy  01/08/2012     RIGHT  . Mastectomy w/ sentinel node biopsy  01/08/2012    Procedure: MASTECTOMY WITH SENTINEL LYMPH NODE BIOPSY;  Surgeon: Madilyn Hook, DO;  Location: [place];  Service: General;  Laterality: Right;  right breast mastectomy with sentinel lymph node biopsy     FAMILY HISTORY :   Family History  Problem Relation Age of Onset  . Aneurysm Mother     brain  . Cancer Father     Lung, Liver mets  . Lung cancer Father   . Heart disease Other   . Cancer Cousin     Breast CA  . Breast cancer Cousin   . Breast cancer Maternal Aunt 55    SOCIAL HISTORY:   Social History  Substance Use Topics  . Smoking status: Never Smoker   . Smokeless tobacco: Never Used  . Alcohol Use: 0.0 oz/week    0 Standard drinks or equivalent per week     Comment: 1 glass of wine occ    ALLERGIES:  is allergic to codeine; penicillins; and adhesive.  MEDICATIONS:  Current Outpatient Prescriptions  Medication Sig Dispense Refill  . acetaminophen (TYLENOL) 500 MG tablet Take 500 mg by mouth every 4 (four) hours as needed for fever.    Marland Kitchen  alendronate (FOSAMAX) 70 MG tablet take 1 tablet by mouth every week on an empty stomach with 6-8 oz of water. No food / meds for 30 min. Remain Upright 4 tablet 0  . amLODipine (NORVASC) 5 MG tablet Take 1 tablet (5 mg total) by mouth daily. 30 tablet 11  . anastrozole (ARIMIDEX) 1 MG tablet Take 1 tablet (1 mg total) by mouth daily. 90 tablet 2  . cholecalciferol (VITAMIN D) 1000 units tablet Take 1,000 Units by mouth daily.    Marland Kitchen levothyroxine (SYNTHROID, LEVOTHROID) 50 MCG tablet Take 1 tablet (50 mcg total) by mouth daily. 30 tablet 11  . rosuvastatin (CRESTOR) 10 MG tablet Take 1 tablet (10 mg total) by mouth daily. 30 tablet 11   No current facility-administered medications for this visit.    PHYSICAL EXAMINATION: ECOG PERFORMANCE STATUS: 0 - Asymptomatic  BP 186/80 mmHg  Pulse 82  Temp(Src) 98.4 F (36.9 C) (Tympanic)  Wt 123 lb 10.9 oz (56.1 kg)  Filed  Weights   05/01/15 1111  Weight: 123 lb 10.9 oz (56.1 kg)    GENERAL: Well-nourished well-developed; Alert, no distress and comfortable.  Alone.  EYES: no pallor or icterus OROPHARYNX: no thrush or ulceration; good dentition  NECK: supple, no masses felt LYMPH:  no palpable lymphadenopathy in the cervical, axillary or inguinal regions LUNGS: clear to auscultation and  No wheeze or crackles HEART/CVS: regular rate & rhythm and positive for murmurs; No lower extremity edema ABDOMEN:abdomen soft, non-tender and normal bowel sounds Musculoskeletal:no cyanosis of digits and no clubbing  PSYCH: alert & oriented x 3 with fluent speech NEURO: no focal motor/sensory deficits SKIN:  no rashes or significant lesions  LABORATORY DATA:  I have reviewed the data as listed    Component Value Date/Time   NA 136 05/01/2015 0958   NA 144 05/05/2013 0807   K 3.8 05/01/2015 0958   K 3.8 05/05/2013 0807   CL 103 05/01/2015 0958   CL 105 05/06/2012 1433   CO2 27 05/01/2015 0958   CO2 25 05/05/2013 0807   GLUCOSE 109* 05/01/2015 0958   GLUCOSE 97 05/05/2013 0807   GLUCOSE 93 05/06/2012 1433   BUN 19 05/01/2015 0958   BUN 15.6 05/05/2013 0807   CREATININE 0.96 05/01/2015 0958   CREATININE 1.06 11/13/2013 1247   CREATININE 1.0 05/05/2013 0807   CALCIUM 9.1 05/01/2015 0958   CALCIUM 9.7 05/05/2013 0807   PROT 7.6 05/01/2015 0958   PROT 8.0 11/13/2013 1247   PROT 7.8 05/05/2013 0807   ALBUMIN 4.8 05/01/2015 0958   ALBUMIN 4.6 11/13/2013 1247   ALBUMIN 4.5 05/05/2013 0807   AST 30 05/01/2015 0958   AST 32 11/13/2013 1247   AST 27 05/05/2013 0807   ALT 16 05/01/2015 0958   ALT 31 11/13/2013 1247   ALT 19 05/05/2013 0807   ALKPHOS 64 05/01/2015 0958   ALKPHOS 58 11/13/2013 1247   ALKPHOS 79 05/05/2013 0807   BILITOT 1.1 05/01/2015 0958   BILITOT 0.7 11/13/2013 1247   BILITOT 0.64 05/05/2013 0807   GFRNONAA 58* 05/01/2015 0958   GFRNONAA 53* 11/13/2013 1247   GFRAA >60 05/01/2015 0958    GFRAA >60 11/13/2013 1247    No results found for: SPEP, UPEP  Lab Results  Component Value Date   WBC 7.0 05/01/2015   NEUTROABS 4.6 05/01/2015   HGB 13.5 05/01/2015   HCT 40.0 05/01/2015   MCV 87.2 05/01/2015   PLT 271 05/01/2015      Chemistry  Component Value Date/Time   NA 136 05/01/2015 0958   NA 144 05/05/2013 0807   K 3.8 05/01/2015 0958   K 3.8 05/05/2013 0807   CL 103 05/01/2015 0958   CL 105 05/06/2012 1433   CO2 27 05/01/2015 0958   CO2 25 05/05/2013 0807   BUN 19 05/01/2015 0958   BUN 15.6 05/05/2013 0807   CREATININE 0.96 05/01/2015 0958   CREATININE 1.06 11/13/2013 1247   CREATININE 1.0 05/05/2013 0807      Component Value Date/Time   CALCIUM 9.1 05/01/2015 0958   CALCIUM 9.7 05/05/2013 0807   ALKPHOS 64 05/01/2015 0958   ALKPHOS 58 11/13/2013 1247   ALKPHOS 79 05/05/2013 0807   AST 30 05/01/2015 0958   AST 32 11/13/2013 1247   AST 27 05/05/2013 0807   ALT 16 05/01/2015 0958   ALT 31 11/13/2013 1247   ALT 19 05/05/2013 0807   BILITOT 1.1 05/01/2015 0958   BILITOT 0.7 11/13/2013 1247   BILITOT 0.64 05/05/2013 0807        ASSESSMENT & PLAN:   # RIGHT breast cancer [October 2013] stage I status post mastectomy; ER/PR positive HER-2/neu negative. Currently on adjuvant Arimidex. Clinically no evidence of recurrence noted.   # Left breast cancer status post lumpectomy- mammogram negative in September 2016. Follow-up mammogram this year.  #  DEXA scan 2016 - low bone mass /however improvement noted from 2014.   # CBC within normal limits CMP within normal limits. CA-27-29 pending. Follow-up with me in approximately 6 months/no labs at that time.  # 15 minutes face-to-face with the patient discussing the above plan of care; more than 50% of time spent on natural history; counseling and coordination.     Cammie Sickle, MD 05/01/2015 11:28 AM

## 2015-05-02 LAB — CANCER ANTIGEN 27.29: CA 27.29: 9.8 U/mL (ref 0.0–38.6)

## 2015-11-14 ENCOUNTER — Ambulatory Visit
Admission: RE | Admit: 2015-11-14 | Discharge: 2015-11-14 | Disposition: A | Discharge status: Home / Self Care | Payer: Medicare Other | Source: Ambulatory Visit | Attending: Internal Medicine | Admitting: Internal Medicine

## 2015-11-14 ENCOUNTER — Other Ambulatory Visit: Payer: Self-pay | Admitting: Internal Medicine

## 2015-11-14 DIAGNOSIS — C50312 Malignant neoplasm of lower-inner quadrant of left female breast: Secondary | ICD-10-CM

## 2015-11-14 DIAGNOSIS — Z1231 Encounter for screening mammogram for malignant neoplasm of breast: Secondary | ICD-10-CM | POA: Diagnosis not present

## 2015-11-15 ENCOUNTER — Other Ambulatory Visit: Payer: Self-pay | Admitting: Internal Medicine

## 2015-11-18 ENCOUNTER — Encounter: Payer: Self-pay | Admitting: Internal Medicine

## 2015-11-18 ENCOUNTER — Inpatient Hospital Stay: Payer: Medicare Other | Attending: Internal Medicine | Admitting: Internal Medicine

## 2015-11-18 DIAGNOSIS — Q210 Ventricular septal defect: Secondary | ICD-10-CM | POA: Diagnosis not present

## 2015-11-18 DIAGNOSIS — C50311 Malignant neoplasm of lower-inner quadrant of right female breast: Secondary | ICD-10-CM | POA: Insufficient documentation

## 2015-11-18 DIAGNOSIS — Z9011 Acquired absence of right breast and nipple: Secondary | ICD-10-CM | POA: Diagnosis not present

## 2015-11-18 DIAGNOSIS — E039 Hypothyroidism, unspecified: Secondary | ICD-10-CM | POA: Diagnosis not present

## 2015-11-18 DIAGNOSIS — Z853 Personal history of malignant neoplasm of breast: Secondary | ICD-10-CM

## 2015-11-18 DIAGNOSIS — I10 Essential (primary) hypertension: Secondary | ICD-10-CM | POA: Diagnosis not present

## 2015-11-18 DIAGNOSIS — Z170 Estrogen receptor positive status [ER+]: Secondary | ICD-10-CM | POA: Diagnosis not present

## 2015-11-18 DIAGNOSIS — Z801 Family history of malignant neoplasm of trachea, bronchus and lung: Secondary | ICD-10-CM | POA: Insufficient documentation

## 2015-11-18 DIAGNOSIS — E785 Hyperlipidemia, unspecified: Secondary | ICD-10-CM | POA: Insufficient documentation

## 2015-11-18 DIAGNOSIS — K219 Gastro-esophageal reflux disease without esophagitis: Secondary | ICD-10-CM | POA: Diagnosis not present

## 2015-11-18 DIAGNOSIS — Z803 Family history of malignant neoplasm of breast: Secondary | ICD-10-CM | POA: Insufficient documentation

## 2015-11-18 DIAGNOSIS — Z923 Personal history of irradiation: Secondary | ICD-10-CM | POA: Diagnosis not present

## 2015-11-18 DIAGNOSIS — Z79811 Long term (current) use of aromatase inhibitors: Secondary | ICD-10-CM

## 2015-11-18 DIAGNOSIS — Z79899 Other long term (current) drug therapy: Secondary | ICD-10-CM | POA: Diagnosis not present

## 2015-11-18 DIAGNOSIS — Z9223 Personal history of estrogen therapy: Secondary | ICD-10-CM | POA: Insufficient documentation

## 2015-11-18 DIAGNOSIS — M8580 Other specified disorders of bone density and structure, unspecified site: Secondary | ICD-10-CM | POA: Diagnosis not present

## 2015-11-18 NOTE — Assessment & Plan Note (Addendum)
#  RIGHT breast cancer [October 2013] stage I status post mastectomy; ER/PR positive HER-2/neu negative. Currently on adjuvant Arimidex. Clinically no evidence of recurrence noted.   # Left breast cancer status post lumpectomy- mammogram negative in September 2017.  #  DEXA scan 2016 - low bone mass /however improvement noted from 2014.

## 2015-11-18 NOTE — Progress Notes (Signed)
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  Patient denies any new lumps or bumps. Appetite is good. Denies any bone pain. No weight loss. No shortness of breath or cough.  REVIEW OF SYSTEMS:  A complete 10 point review of system is done which is negative except mentioned above/history of present illness.   PAST MEDICAL HISTORY :  Past Medical History:  Diagnosis Date  . Breast cancer ([place])    L breast- 1994- lumpectomy, [facility]   . Cataract   . Endocarditis   . GERD (gastroesophageal reflux disease)   . Heart murmur    VSD- echocardiogram - [facility] 10 yrs. ago  . History of breast cancer   . Hyperlipidemia   . Hypertension   . Hypothyroidism   . Osteopenia   . Overweight(278.02)   . Thyroid disease    Hypothyroidism  . VSD (ventricular septal defect)    does need SBE prophylaxis (Keflex  3 gm 1 hour pprior and 1.5 gm 6 hours post  . VSD (ventricular septal defect)     PAST SURGICAL HISTORY :   Past Surgical History:  Procedure Laterality Date  . 2D echo  2004   Normal at [facility]  . AUGMENTATION MAMMAPLASTY Bilateral    removed 1994  . BREAST BIOPSY Left 1994   breast ca  . BREAST IMPLANT REMOVAL  1994  . BREAST LUMPECTOMY  1994   Radiation  .  MASTECTOMY  01/08/2012   RIGHT  . MASTECTOMY W/ SENTINEL NODE BIOPSY  01/08/2012   Procedure: MASTECTOMY WITH SENTINEL LYMPH NODE BIOPSY;  Surgeon: Madilyn Hook, DO;  Location: [facility];  Service: General;  Laterality: Right;  right breast mastectomy with sentinel lymph node biopsy   . PARATHYROIDECTOMY  12/2002  . TONSILLECTOMY     As a child  . TUBAL LIGATION     1976    FAMILY HISTORY :   Family History  Problem Relation Age of Onset  . Aneurysm Mother     brain  . Cancer Father     Lung, Liver mets  . Lung cancer Father   . Heart disease Other   . Cancer Cousin     Breast CA  . Breast cancer Cousin   . Breast cancer Maternal Aunt 55    SOCIAL HISTORY:   Social History  Substance Use Topics  . Smoking status: Never Smoker  . Smokeless tobacco: Never Used  . Alcohol use 0.0 oz/week     Comment: 1 glass of wine occ    ALLERGIES:  is allergic to codeine; penicillins; and adhesive [tape].  MEDICATIONS:  Current Outpatient Prescriptions  Medication Sig Dispense Refill  . acetaminophen (TYLENOL) 500 MG tablet Take 500 mg by  mouth every 4 (four) hours as needed for fever.    . alendronate (FOSAMAX) 70 MG tablet take 1 tablet by mouth every week on an empty stomach with 6-8 oz of water. No food / meds for 30 min. Remain Upright 4 tablet 0  . amLODipine (NORVASC) 5 MG tablet Take 1 tablet (5 mg total) by mouth daily. 30 tablet 11  . anastrozole (ARIMIDEX) 1 MG tablet take 1 tablet by mouth once daily 90 tablet 2  . cholecalciferol (VITAMIN D) 1000 units tablet Take 1,000 Units by mouth daily.    . levothyroxine (SYNTHROID, LEVOTHROID) 50 MCG tablet Take 1 tablet (50 mcg total) by mouth daily. 30 tablet 11  . rosuvastatin (CRESTOR) 10 MG tablet Take 1 tablet (10 mg total) by mouth daily. 30 tablet 11   No current facility-administered medications for this visit.     PHYSICAL EXAMINATION: ECOG PERFORMANCE STATUS: 0 - Asymptomatic  BP (!) 158/74 (BP Location: Right Arm,  Patient Position: Sitting)   Pulse 79   Temp 97.6 F (36.4 C) (Tympanic)   Resp 16   Ht 5' (1.524 m)   Wt 128 lb 12.8 oz (58.4 kg)   BMI 25.15 kg/m   Filed Weights   11/18/15 1003  Weight: 128 lb 12.8 oz (58.4 kg)    GENERAL: Well-nourished well-developed; Alert, no distress and comfortable.  Alone.  EYES: no pallor or icterus OROPHARYNX: no thrush or ulceration; good dentition  NECK: supple, no masses felt LYMPH:  no palpable lymphadenopathy in the cervical, axillary or inguinal regions LUNGS: clear to auscultation and  No wheeze or crackles HEART/CVS: regular rate & rhythm and positive for murmurs; No lower extremity edema ABDOMEN:abdomen soft, non-tender and normal bowel sounds Musculoskeletal:no cyanosis of digits and no clubbing  PSYCH: alert & oriented x 3 with fluent speech NEURO: no focal motor/sensory deficits SKIN:  no rashes or significant lesions  LABORATORY DATA:  I have reviewed the data as listed    Component Value Date/Time   NA 136 05/01/2015 0958   NA 144 05/05/2013 0807   K 3.8 05/01/2015 0958   K 3.8 05/05/2013 0807   CL 103 05/01/2015 0958   CL 105 05/06/2012 1433   CO2 27 05/01/2015 0958   CO2 25 05/05/2013 0807   GLUCOSE 109 (H) 05/01/2015 0958   GLUCOSE 97 05/05/2013 0807   GLUCOSE 93 05/06/2012 1433   BUN 19 05/01/2015 0958   BUN 15.6 05/05/2013 0807   CREATININE 0.96 05/01/2015 0958   CREATININE 1.06 11/13/2013 1247   CREATININE 1.0 05/05/2013 0807   CALCIUM 9.1 05/01/2015 0958   CALCIUM 9.7 05/05/2013 0807   PROT 7.6 05/01/2015 0958   PROT 8.0 11/13/2013 1247   PROT 7.8 05/05/2013 0807   ALBUMIN 4.8 05/01/2015 0958   ALBUMIN 4.6 11/13/2013 1247   ALBUMIN 4.5 05/05/2013 0807   AST 30 05/01/2015 0958   AST 32 11/13/2013 1247   AST 27 05/05/2013 0807   ALT 16 05/01/2015 0958   ALT 31 11/13/2013 1247   ALT 19 05/05/2013 0807   ALKPHOS 64 05/01/2015 0958   ALKPHOS 58 11/13/2013 1247   ALKPHOS 79 05/05/2013 0807   BILITOT 1.1  05/01/2015 0958   BILITOT 0.7 11/13/2013 1247   BILITOT 0.64 05/05/2013 0807   GFRNONAA 58 (L) 05/01/2015 0958   GFRNONAA 53 (L) 11/13/2013 1247   GFRAA >60 05/01/2015 0958   GFRAA >60 11/13/2013 1247    No results found for: SPEP, UPEP  Lab Results  Component   Value Date   WBC 7.0 05/01/2015   NEUTROABS 4.6 05/01/2015   HGB 13.5 05/01/2015   HCT 40.0 05/01/2015   MCV 87.2 05/01/2015   PLT 271 05/01/2015      Chemistry      Component Value Date/Time   NA 136 05/01/2015 0958   NA 144 05/05/2013 0807   K 3.8 05/01/2015 0958   K 3.8 05/05/2013 0807   CL 103 05/01/2015 0958   CL 105 05/06/2012 1433   CO2 27 05/01/2015 0958   CO2 25 05/05/2013 0807   BUN 19 05/01/2015 0958   BUN 15.6 05/05/2013 0807   CREATININE 0.96 05/01/2015 0958   CREATININE 1.06 11/13/2013 1247   CREATININE 1.0 05/05/2013 0807      Component Value Date/Time   CALCIUM 9.1 05/01/2015 0958   CALCIUM 9.7 05/05/2013 0807   ALKPHOS 64 05/01/2015 0958   ALKPHOS 58 11/13/2013 1247   ALKPHOS 79 05/05/2013 0807   AST 30 05/01/2015 0958   AST 32 11/13/2013 1247   AST 27 05/05/2013 0807   ALT 16 05/01/2015 0958   ALT 31 11/13/2013 1247   ALT 19 05/05/2013 0807   BILITOT 1.1 05/01/2015 0958   BILITOT 0.7 11/13/2013 1247   BILITOT 0.64 05/05/2013 0807        ASSESSMENT & PLAN:   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 Currently on adjuvant Arimidex. Clinically no evidence of recurrence noted.   # Left breast cancer status post lumpectomy- mammogram negative in September 2017.  #  DEXA scan 2016 - low bone mass /however improvement noted from 2014.     # Recommend follow-up in 6 months 11/19/2015 8:37 AM  

## 2015-11-18 NOTE — Progress Notes (Signed)
RN Chaperoned provider with Breast Exam.   

## 2015-11-18 NOTE — Progress Notes (Signed)
Pt reports on right ear has a place that has not healed.  Last mammo 2 days ago.

## 2016-01-22 ENCOUNTER — Ambulatory Visit: Payer: Medicare Other | Admitting: Cardiovascular Disease

## 2016-02-17 ENCOUNTER — Telehealth: Payer: Self-pay | Admitting: Family Medicine

## 2016-02-17 ENCOUNTER — Other Ambulatory Visit: Payer: Self-pay | Admitting: Family Medicine

## 2016-02-17 DIAGNOSIS — E559 Vitamin D deficiency, unspecified: Secondary | ICD-10-CM

## 2016-02-17 DIAGNOSIS — Z0000 Encounter for general adult medical examination without abnormal findings: Secondary | ICD-10-CM

## 2016-02-17 NOTE — Telephone Encounter (Signed)
-----   Message from Ellamae Sia sent at 02/12/2016  3:05 PM EST ----- Regarding: Lab orders for Friday, 12.29.17 Patient is scheduled for CPX labs, please order future labs, Thanks , Karna Christmas

## 2016-02-21 ENCOUNTER — Other Ambulatory Visit: Payer: Medicare Other

## 2016-02-21 ENCOUNTER — Ambulatory Visit (INDEPENDENT_AMBULATORY_CARE_PROVIDER_SITE_OTHER): Payer: Medicare HMO

## 2016-02-21 VITALS — BP 132/78 | HR 75 | Temp 97.6°F | Ht 60.0 in | Wt 127.0 lb

## 2016-02-21 DIAGNOSIS — E559 Vitamin D deficiency, unspecified: Secondary | ICD-10-CM

## 2016-02-21 DIAGNOSIS — Z0000 Encounter for general adult medical examination without abnormal findings: Secondary | ICD-10-CM

## 2016-02-21 LAB — COMPREHENSIVE METABOLIC PANEL
ALK PHOS: 59 U/L (ref 39–117)
ALT: 16 U/L (ref 0–35)
AST: 28 U/L (ref 0–37)
Albumin: 4.9 g/dL (ref 3.5–5.2)
BILIRUBIN TOTAL: 0.6 mg/dL (ref 0.2–1.2)
BUN: 13 mg/dL (ref 6–23)
CO2: 28 mEq/L (ref 19–32)
Calcium: 9.8 mg/dL (ref 8.4–10.5)
Chloride: 105 mEq/L (ref 96–112)
Creatinine, Ser: 1.03 mg/dL (ref 0.40–1.20)
GFR: 55.72 mL/min — ABNORMAL LOW (ref >60.00)
GLUCOSE: 105 mg/dL — AB (ref 70–99)
POTASSIUM: 4.1 meq/L (ref 3.5–5.1)
SODIUM: 141 meq/L (ref 135–145)
TOTAL PROTEIN: 7.3 g/dL (ref 6.0–8.3)

## 2016-02-21 LAB — CBC WITH DIFFERENTIAL/PLATELET
BASOS ABS: 0.1 10*3/uL (ref 0.0–0.1)
Basophils Relative: 0.9 % (ref 0.0–3.0)
EOS ABS: 0.1 10*3/uL (ref 0.0–0.7)
Eosinophils Relative: 1.8 % (ref 0.0–5.0)
HCT: 40.4 % (ref 36.0–46.0)
Hemoglobin: 13.8 g/dL (ref 12.0–15.0)
LYMPHS ABS: 1.7 10*3/uL (ref 0.7–4.0)
Lymphocytes Relative: 26.8 % (ref 12.0–46.0)
MCHC: 34.2 g/dL (ref 30.0–36.0)
MCV: 91.5 fl (ref 78.0–100.0)
MONO ABS: 0.4 10*3/uL (ref 0.1–1.0)
Monocytes Relative: 6.6 % (ref 3.0–12.0)
NEUTROS PCT: 63.9 % (ref 43.0–77.0)
Neutro Abs: 4.1 10*3/uL (ref 1.4–7.7)
Platelets: 260 10*3/uL (ref 150.0–400.0)
RBC: 4.41 Mil/uL (ref 3.87–5.11)
RDW: 13.1 % (ref 11.5–15.5)
WBC: 6.5 10*3/uL (ref 4.0–10.5)

## 2016-02-21 LAB — VITAMIN D 25 HYDROXY (VIT D DEFICIENCY, FRACTURES): VITD: 35.36 ng/mL (ref 30.00–100.00)

## 2016-02-21 LAB — LIPID PANEL
Cholesterol: 192 mg/dL (ref 0–200)
HDL: 77.5 mg/dL (ref >39.00)
LDL Cholesterol: 90 mg/dL (ref 0–99)
NONHDL: 114.77
Total CHOL/HDL Ratio: 2
Triglycerides: 125 mg/dL (ref 0.0–149.0)
VLDL: 25 mg/dL (ref 0.0–40.0)

## 2016-02-21 LAB — TSH: TSH: 6.98 u[IU]/mL — AB (ref 0.35–4.50)

## 2016-02-21 NOTE — Progress Notes (Signed)
Subjective:   Madison Huerta is a 75 y.o. female who presents for Medicare Annual (Subsequent) preventive examination.  Review of Systems:  N/A Cardiac Risk Factors include: advanced age (>52men, >22 women);dyslipidemia;hypertension     Objective:     Vitals: BP 132/78 (BP Location: Left Arm, Patient Position: Sitting, Cuff Size: Normal)   Pulse 75   Temp 97.6 F (36.4 C) (Oral)   Ht 5' (1.524 m) Comment: no shoes  Wt 127 lb (57.6 kg)   SpO2 98%   BMI 24.80 kg/m   Body mass index is 24.8 kg/m.   Tobacco History  Smoking Status  . Never Smoker  Smokeless Tobacco  . Never Used     Counseling given: No   Past Medical History:  Diagnosis Date  . Breast cancer ([place])    L breast- 1994- lumpectomy, care for at [facility]   . Cataract   . Endocarditis   . GERD (gastroesophageal reflux disease)   . Heart murmur    VSD- echocardiogram - [facility] 10 yrs. ago  . History of breast cancer   . Hyperlipidemia   . Hypertension   . Hypothyroidism   . Osteopenia   . Overweight(278.02)   . Thyroid disease    Hypothyroidism  . VSD (ventricular septal defect)    does need SBE prophylaxis (Keflex  3 gm 1 hour pprior and 1.5 gm 6 hours post  . VSD (ventricular septal defect)    Past Surgical History:  Procedure Laterality Date  . 2D echo  2004   [facility] at [facility]  . AUGMENTATION MAMMAPLASTY Bilateral    removed 1994  . BREAST BIOPSY Left 1994   breast ca  . BREAST IMPLANT REMOVAL  1994  . BREAST LUMPECTOMY  1994   Radiation  . MASTECTOMY  01/08/2012   RIGHT  . MASTECTOMY W/ SENTINEL NODE BIOPSY  01/08/2012   Procedure: MASTECTOMY WITH SENTINEL LYMPH NODE BIOPSY;  Surgeon: Madilyn Hook, DO;  Location: [facility];  Service: General;  Laterality: Right;  right breast mastectomy with sentinel lymph node biopsy   . PARATHYROIDECTOMY  12/2002  . TONSILLECTOMY     As a child  . TUBAL LIGATION     1976   Family History  Problem Relation Age of Onset  . Aneurysm Mother     brain  .  Cancer Father     Lung, Liver mets  . Lung cancer Father   . Heart disease Other   . Cancer Cousin     Breast CA  . Breast cancer Cousin   . Breast cancer Maternal Aunt 55   History  Sexual Activity  . Sexual activity: No    Outpatient Encounter Prescriptions as of 02/21/2016  Medication Sig  . acetaminophen (TYLENOL) 500 MG tablet Take 500 mg by mouth every 4 (four) hours as needed for fever.  Marland Kitchen amLODipine (NORVASC) 5 MG tablet Take 1 tablet (5 mg total) by mouth daily.  Marland Kitchen anastrozole (ARIMIDEX) 1 MG tablet take 1 tablet by mouth once daily  . cholecalciferol (VITAMIN D) 1000 units tablet Take 1,000 Units by mouth daily.  Marland Kitchen levothyroxine (SYNTHROID, LEVOTHROID) 50 MCG tablet take 1 tablet by mouth once daily  . rosuvastatin (CRESTOR) 10 MG tablet take 1 tablet by mouth once daily  . [DISCONTINUED] alendronate (FOSAMAX) 70 MG tablet take 1 tablet by mouth every week on an empty stomach with 6-8 oz of water. No food / meds for 30 min. Remain Upright   No facility-administered encounter medications  on file as of 02/21/2016.     Activities of Daily Living In your present state of health, do you have any difficulty performing the following activities: 02/21/2016  Hearing? N  Vision? N  Difficulty concentrating or making decisions? N  Walking or climbing stairs? N  Dressing or bathing? N  Doing errands, shopping? N  Preparing Food and eating ? N  Using the Toilet? N  In the past six months, have you accidently leaked urine? N  Do you have problems with loss of bowel control? N  Managing your Medications? N  Managing your Finances? N  Housekeeping or managing your Housekeeping? N  Some recent data might be hidden    Patient Care Team: Abner Greenspan, MD as PCP - General Cammie Sickle, MD as Consulting Physician (Internal Medicine) Karsten Fells, DDS as Consulting Physician (Dentistry) Kathyrn Lass, OD as Consulting Physician (Optometry)    Assessment:     Hearing  Screening   125Hz  250Hz  500Hz  1000Hz  2000Hz  3000Hz  4000Hz  6000Hz  8000Hz   Right ear:   40 40 40  40    Left ear:   40 40 40  40      Visual Acuity Screening   Right eye Left eye Both eyes  Without correction:     With correction: 20/40 20/25 20/20     Exercise Activities and Dietary recommendations Current Exercise Habits: Structured exercise class, Type of exercise: strength training/weights;stretching;calisthenics;Other - see comments (aerobics), Time (Minutes): 60, Frequency (Times/Week): 5, Weekly Exercise (Minutes/Week): 300, Intensity: Moderate, Exercise limited by: None identified  Goals    . Increase physical activity          Starting 02/21/2016, I will continue to exercise at least 60 min 4-5 days per week.       Fall Risk Fall Risk  02/21/2016 02/26/2015 02/21/2014 02/20/2013 02/19/2012  Falls in the past year? No No Yes No No  Number falls in past yr: - - 1 - -  Injury with Fall? - - No - -   Depression Screen PHQ 2/9 Scores 02/21/2016 02/26/2015 02/21/2014 02/20/2013  PHQ - 2 Score 0 0 0 0     Cognitive Function MMSE - Mini Mental State Exam 02/21/2016  Orientation to time 5  Orientation to Place 5  Registration 3  Attention/ Calculation 0  Recall 3  Language- name 2 objects 0  Language- repeat 1  Language- follow 3 step command 3  Language- read & follow direction 0  Write a sentence 0  Copy design 0  Total score 20     PLEASE NOTE: A Mini-Cog screen was completed. Maximum score is 20. A value of 0 denotes this part of Folstein MMSE was not completed or the patient failed this part of the Mini-Cog screening.   Mini-Cog Screening Orientation to Time - Max 5 pts Orientation to Place - Max 5 pts Registration - Max 3 pts Recall - Max 3 pts Language Repeat - Max 1 pts Language Follow 3 Step Command - Max 3 pts     Immunization History  Administered Date(s) Administered  . Influenza Split 11/20/2010  . Influenza Whole 12/15/2006, 01/23/2009, 12/13/2009,  11/16/2011  . Influenza, High Dose Seasonal PF 12/01/2014  . Influenza,inj,Quad PF,36+ Mos 10/21/2015  . Influenza-Unspecified 11/22/2012, 10/07/2013  . Pneumococcal Conjugate-13 02/21/2014  . Pneumococcal Polysaccharide-23 01/14/2004, 02/12/2010  . Td 11/28/2001  . Tdap 02/18/2011  . Zoster 02/06/2008   Screening Tests Health Maintenance  Topic Date Due  . MAMMOGRAM  11/13/2016  .  PAP SMEAR  02/25/2017  . TETANUS/TDAP  02/17/2021  . COLONOSCOPY  09/13/2021  . INFLUENZA VACCINE  Completed  . DEXA SCAN  Completed  . ZOSTAVAX  Completed  . PNA vac Low Risk Adult  Completed      Plan:     I have personally reviewed and addressed the Medicare Annual Wellness questionnaire and have noted the following in the patient's chart:  A. Medical and social history B. Use of alcohol, tobacco or illicit drugs  C. Current medications and supplements D. Functional ability and status E.  Nutritional status F.  Physical activity G. Advance directives H. List of other physicians I.  Hospitalizations, surgeries, and ER visits in previous 12 months J.  [place] to include hearing, vision, cognitive, depression L. Referrals and appointments - none  In addition, I have reviewed and discussed with patient certain preventive protocols, quality metrics, and best practice recommendations. A written personalized care plan for preventive services as well as general preventive health recommendations were provided to patient.  See attached scanned questionnaire for additional information.   Signed,   Lindell Noe, MHA, BS, LPN Health Coach

## 2016-02-21 NOTE — Patient Instructions (Signed)
Ms. Fullam , Thank you for taking time to come for your Medicare Wellness Visit. I appreciate your ongoing commitment to your health goals. Please review the following plan we discussed and let me know if I can assist you in the future.   These are the goals we discussed: Goals    . Increase physical activity          Starting 02/21/2016, I will continue to exercise at least 60 min 4-5 days per week.        This is a list of the screening recommended for you and due dates:  Health Maintenance  Topic Date Due  . Mammogram  11/13/2016  . Pap Smear  02/25/2017  . Tetanus Vaccine  02/17/2021  . Colon Cancer Screening  09/13/2021  . Flu Shot  Completed  . DEXA scan (bone density measurement)  Completed  . Shingles Vaccine  Completed  . Pneumonia vaccines  Completed   Preventive Care for Adults  A healthy lifestyle and preventive care can promote health and wellness. Preventive health guidelines for adults include the following key practices.  . A routine yearly physical is a good way to check with your health care provider about your health and preventive screening. It is a chance to share any concerns and updates on your health and to receive a thorough exam.  . Visit your dentist for a routine exam and preventive care every 6 months. Brush your teeth twice a day and floss once a day. Good oral hygiene prevents tooth decay and gum disease.  . The frequency of eye exams is based on your age, health, family medical history, use  of contact lenses, and other factors. Follow your health care provider's ecommendations for frequency of eye exams.  . Eat a healthy diet. Foods like vegetables, fruits, whole grains, low-fat dairy products, and lean protein foods contain the nutrients you need without too many calories. Decrease your intake of foods high in solid fats, added sugars, and salt. Eat the right amount of calories for you. Get information about a proper diet from your health care  provider, if necessary.  . Regular physical exercise is one of the most important things you can do for your health. Most adults should get at least 150 minutes of moderate-intensity exercise (any activity that increases your heart rate and causes you to sweat) each week. In addition, most adults need muscle-strengthening exercises on 2 or more days a week.  Silver Sneakers may be a benefit available to you. To determine eligibility, you may visit the website: www.silversneakers.com or contact program at 807-755-1246 Mon-Fri between 8AM-8PM.   . Maintain a healthy weight. The body mass index (BMI) is a screening tool to identify possible weight problems. It provides an estimate of body fat based on height and weight. Your health care provider can find your BMI and can help you achieve or maintain a healthy weight.   For adults 20 years and older: ? A BMI below 18.5 is considered underweight. ? A BMI of 18.5 to 24.9 is normal. ? A BMI of 25 to 29.9 is considered overweight. ? A BMI of 30 and above is considered obese.   . Maintain normal blood lipids and cholesterol levels by exercising and minimizing your intake of saturated fat. Eat a balanced diet with plenty of fruit and vegetables. Blood tests for lipids and cholesterol should begin at age 73 and be repeated every 5 years. If your lipid or cholesterol levels are high, you are  over 31, or you are at high risk for heart disease, you may need your cholesterol levels checked more frequently. Ongoing high lipid and cholesterol levels should be treated with medicines if diet and exercise are not working.  . If you smoke, find out from your health care provider how to quit. If you do not use tobacco, please do not start.  . If you choose to drink alcohol, please do not consume more than 2 drinks per day. One drink is considered to be 12 ounces (355 mL) of beer, 5 ounces (148 mL) of wine, or 1.5 ounces (44 mL) of liquor.  . If you are 64-79 years  old, ask your health care provider if you should take aspirin to prevent strokes.  . Use sunscreen. Apply sunscreen liberally and repeatedly throughout the day. You should seek shade when your shadow is shorter than you. Protect yourself by wearing long sleeves, pants, a wide-brimmed hat, and sunglasses year round, whenever you are outdoors.  . Once a month, do a whole body skin exam, using a mirror to look at the skin on your back. Tell your health care provider of new moles, moles that have irregular borders, moles that are larger than a pencil eraser, or moles that have changed in shape or color.

## 2016-02-21 NOTE — Progress Notes (Signed)
PCP notes:   Health maintenance:  No gaps identified. Health maintenance schedule reviewed with patient.  Abnormal screenings:   None  Patient concerns:   None  Nurse concerns:  None  Next PCP appt:   02/28/16 @ 0930  I reviewed health advisor's note, was available for consultation, and agree with documentation and plan. Loura Pardon MD

## 2016-02-21 NOTE — Progress Notes (Signed)
Pre visit review using our clinic review tool, if applicable. No additional management support is needed unless otherwise documented below in the visit note. 

## 2016-02-28 ENCOUNTER — Ambulatory Visit (INDEPENDENT_AMBULATORY_CARE_PROVIDER_SITE_OTHER): Payer: Medicare HMO | Admitting: Family Medicine

## 2016-02-28 ENCOUNTER — Encounter: Payer: Self-pay | Admitting: Family Medicine

## 2016-02-28 VITALS — BP 126/60 | HR 71 | Temp 97.6°F | Ht 60.0 in | Wt 128.2 lb

## 2016-02-28 DIAGNOSIS — Z0000 Encounter for general adult medical examination without abnormal findings: Secondary | ICD-10-CM

## 2016-02-28 DIAGNOSIS — M8589 Other specified disorders of bone density and structure, multiple sites: Secondary | ICD-10-CM | POA: Diagnosis not present

## 2016-02-28 DIAGNOSIS — C50311 Malignant neoplasm of lower-inner quadrant of right female breast: Secondary | ICD-10-CM

## 2016-02-28 DIAGNOSIS — L920 Granuloma annulare: Secondary | ICD-10-CM

## 2016-02-28 DIAGNOSIS — C50312 Malignant neoplasm of lower-inner quadrant of left female breast: Secondary | ICD-10-CM

## 2016-02-28 DIAGNOSIS — E039 Hypothyroidism, unspecified: Secondary | ICD-10-CM

## 2016-02-28 DIAGNOSIS — E7800 Pure hypercholesterolemia, unspecified: Secondary | ICD-10-CM

## 2016-02-28 DIAGNOSIS — R7309 Other abnormal glucose: Secondary | ICD-10-CM | POA: Insufficient documentation

## 2016-02-28 DIAGNOSIS — I10 Essential (primary) hypertension: Secondary | ICD-10-CM

## 2016-02-28 DIAGNOSIS — E559 Vitamin D deficiency, unspecified: Secondary | ICD-10-CM

## 2016-02-28 MED ORDER — AMLODIPINE BESYLATE 5 MG PO TABS: 5.0000 mg | ORAL_TABLET | Freq: Every day | ORAL | 11 refills | Status: DC

## 2016-02-28 MED ORDER — LEVOTHYROXINE SODIUM 75 MCG PO TABS: 75.0000 ug | ORAL_TABLET | Freq: Every day | ORAL | 11 refills | Status: DC

## 2016-02-28 MED ORDER — ROSUVASTATIN CALCIUM 10 MG PO TABS: 10.0000 mg | ORAL_TABLET | Freq: Every day | ORAL | 11 refills | Status: DC

## 2016-02-28 NOTE — Assessment & Plan Note (Signed)
utd dexa  No falls or fx On ca and D D is therapeutic  Continue to follow  On arimidex

## 2016-02-28 NOTE — Progress Notes (Signed)
Pre visit review using our clinic review tool, if applicable. No additional management support is needed unless otherwise documented below in the visit note. 

## 2016-02-28 NOTE — Assessment & Plan Note (Signed)
No changes - seen on feet/ankles

## 2016-02-28 NOTE — Assessment & Plan Note (Signed)
Reviewed health habits including diet and exercise and skin cancer prevention Reviewed appropriate screening tests for age  Also reviewed health mt list, fam hx and immunization status , as well as social and family history   See HPI AMW reviewed [employer] thyroid dose  bp fine on 2nd check  utd mammogram  utd dexa  utd imms  Colonoscopy due 7/18 -pt aware

## 2016-02-28 NOTE — Progress Notes (Signed)
Subjective:    Patient ID: Madison Huerta, female    DOB: [birth year], 76 y.o.   MRN: [id number]  HPI Here for health maintenance exam and to review chronic medical problems    Has been doing pretty well  In the process of moving in a month  Will need px printed for a change in pharmacy   Had AMW with Lesia  No gaps identified  No problems    Wt Readings from Last 3 Encounters:  02/28/16 128 lb 4 oz (58.2 kg)  02/21/16 127 lb (57.6 kg)  11/18/15 128 lb 12.8 oz (58.4 kg)  mt a good weight  Eating healthy  Exercise - (also trying to move) - will have more options with move[facility]  bmi is 25.0  Mammogram 9/17 neg unilateral  Hx of breast cancer on arimidex - unsure how many years left has been 5 years  Sees oncol in March  Doing well  Self breast exam -no lumps or changes  Pap 1/17-neg No vaginal bleeding at all  No symptoms   Colonoscopy 7/13- neg  5 year recall due to hx of prev polyps - aware   dexa 9/16 mild osteopenia  D level 35.3 Taking her ca and D most of the time  Exercises  No falls or fractures   Hx of VSD  bp is up on first check  today  No cp or palpitations or headaches or edema  No side effects to medicines  BP Readings from Last 3 Encounters:  02/28/16 (!) 142/70  02/21/16 132/78  11/18/15 (!) 158/74     Hypothyroidism  Pt has no clinical changes Note she is more tired and sluggish Lab Results  Component Value Date   TSH 6.98 (H) 02/21/2016    No missed doses  Takes in the am - correctly    Cholesterol Lab Results  Component Value Date   CHOL 192 02/21/2016   CHOL 193 02/19/2015   CHOL 186 02/14/2014   Lab Results  Component Value Date   HDL 77.50 02/21/2016   HDL 78.10 02/19/2015   HDL 65.50 02/14/2014   Lab Results  Component Value Date   LDLCALC 90 02/21/2016   LDLCALC 94 02/19/2015   LDLCALC 100 (H) 02/14/2014   Lab Results  Component Value Date   TRIG 125.0 02/21/2016   TRIG 106.0 02/19/2015   TRIG  105.0 02/14/2014   Lab Results  Component Value Date   CHOLHDL 2 02/21/2016   CHOLHDL 2 02/19/2015   CHOLHDL 3 02/14/2014   Lab Results  Component Value Date   LDLDIRECT 107.1 02/06/2010     Lab Results  Component Value Date   WBC 6.5 02/21/2016   HGB 13.8 02/21/2016   HCT 40.4 02/21/2016   MCV 91.5 02/21/2016   PLT 260.0 02/21/2016     Chemistry      Component Value Date/Time   NA 141 02/21/2016 0931   NA 144 05/05/2013 0807   K 4.1 02/21/2016 0931   K 3.8 05/05/2013 0807   CL 105 02/21/2016 0931   CL 105 05/06/2012 1433   CO2 28 02/21/2016 0931   CO2 25 05/05/2013 0807   BUN 13 02/21/2016 0931   BUN 15.6 05/05/2013 0807   CREATININE 1.03 02/21/2016 0931   CREATININE 1.06 11/13/2013 1247   CREATININE 1.0 05/05/2013 0807      Component Value Date/Time   CALCIUM 9.8 02/21/2016 0931   CALCIUM 9.7 05/05/2013 0807   ALKPHOS  59 02/21/2016 0931   ALKPHOS 58 11/13/2013 1247   ALKPHOS 79 05/05/2013 0807   AST 28 02/21/2016 0931   AST 32 11/13/2013 1247   AST 27 05/05/2013 0807   ALT 16 02/21/2016 0931   ALT 31 11/13/2013 1247   ALT 19 05/05/2013 0807   BILITOT 0.6 02/21/2016 0931   BILITOT 0.7 11/13/2013 1247   BILITOT 0.64 05/05/2013 0807      Review of Systems    Review of Systems  Constitutional: Negative for fever, appetite change,  and unexpected weight change.pos for fatigue/sluggishness   Eyes: Negative for pain and visual disturbance.  Respiratory: Negative for cough and shortness of breath.   Cardiovascular: Negative for cp or palpitations    Gastrointestinal: Negative for nausea, diarrhea and constipation.  Genitourinary: Negative for urgency and frequency.  Skin: Negative for pallor or rash   Neurological: Negative for weakness, light-headedness, numbness and headaches.  Hematological: Negative for adenopathy. Does not bruise/bleed easily.  Psychiatric/Behavioral: Negative for dysphoric mood. The patient is not nervous/anxious.      Objective:    Physical Exam  Constitutional: She appears well-developed and well-nourished. No distress.  Well appearing   HENT:  Head: Normocephalic and atraumatic.  Right Ear: External ear normal.  Left Ear: External ear normal.  Mouth/Throat: Oropharynx is clear and moist.  Eyes: Conjunctivae and EOM are normal. Pupils are equal, round, and reactive to light. No scleral icterus.  Neck: Normal range of motion. Neck supple. No JVD present. Carotid bruit is not present. No thyromegaly present.  Cardiovascular: Normal rate, regular rhythm and intact distal pulses.  Exam reveals no gallop.   Murmur heard. Pulmonary/Chest: Effort normal and breath sounds normal. No respiratory distress. She has no wheezes. She exhibits no tenderness.  Abdominal: Soft. Bowel sounds are normal. She exhibits no distension, no abdominal bruit and no mass. There is no tenderness.  Genitourinary: No breast swelling, tenderness, discharge or bleeding.  Genitourinary Comments: R mastectomy site stable L breast - Breast exam: No mass, nodules, thickening, tenderness, bulging, retraction, inflamation, nipple discharge or skin changes noted.  No axillary or clavicular LA.    (baseline thickening under scar on L breast)   Musculoskeletal: Normal range of motion. She exhibits no edema or tenderness.  No kyphosis   Lymphadenopathy:    She has no cervical adenopathy.  Neurological: She is alert. She has normal reflexes. No cranial nerve deficit. She exhibits normal muscle tone. Coordination normal.  Skin: Skin is warm and dry. No rash noted. No erythema. No pallor.  Granuloma annulare -feet, L ankle   Psychiatric: She has a normal mood and affect.          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 changes - seen on feet/ankles       Osteopenia    utd dexa  No falls or fx On ca and D D is therapeutic  Continue to follow  On arimidex        Other   Breast cancer of lower-inner quadrant of right female breast ([place])   Carcinoma of lower-inner quadrant  of breast ([place])   Elevated glucose level   Hyperlipidemia    Good control with crestor and diet  Disc goals for lipids and reasons to control them Rev labs with pt Rev low sat fat diet in detail        Relevant Medications   rosuvastatin (CRESTOR) 10 MG tablet   amLODipine (NORVASC) 5 MG tablet   Routine general medical examination at a health care facility    Reviewed health habits including diet and exercise and skin cancer prevention Reviewed appropriate screening tests for age  Also reviewed health mt list, fam hx and immunization status , as well as social and family history   See HPI AMW reviewed [employer] thyroid dose  bp fine on 2nd check  utd mammogram  utd dexa  utd imms  Colonoscopy due 7/18 -pt aware       Vitamin D deficiency    Vitamin D level is therapeutic with current supplementation Disc importance of this to bone and overall health

## 2016-02-28 NOTE — Patient Instructions (Addendum)
Keep watching diet Start exercise when you can  You are due for a colonoscopy in July  Increase your thyroid dose to 75 mcg daily  Schedule lab in 6 weeks   Take care of yourself  Watch sugar /carbs in diet for blood sugar/we will watch this

## 2016-02-28 NOTE — Assessment & Plan Note (Signed)
Vitamin D level is therapeutic with current supplementation Disc importance of this to bone and overall health  

## 2016-02-28 NOTE — Assessment & Plan Note (Signed)
Good control with crestor and diet  Disc goals for lipids and reasons to control them Rev labs with pt Rev low sat fat diet in detail

## 2016-03-09 ENCOUNTER — Other Ambulatory Visit: Payer: Self-pay | Admitting: Family Medicine

## 2016-04-05 ENCOUNTER — Telehealth: Payer: Self-pay | Admitting: Family Medicine

## 2016-04-05 DIAGNOSIS — E039 Hypothyroidism, unspecified: Secondary | ICD-10-CM

## 2016-04-05 NOTE — Telephone Encounter (Signed)
-----   Message from Ellamae Sia sent at 04/03/2016 10:33 AM EST ----- Regarding: Lab orders for Friday, 2.16.18 Lab orders for 6 week f/u

## 2016-04-10 ENCOUNTER — Other Ambulatory Visit (INDEPENDENT_AMBULATORY_CARE_PROVIDER_SITE_OTHER): Payer: Medicare HMO

## 2016-04-10 DIAGNOSIS — E039 Hypothyroidism, unspecified: Secondary | ICD-10-CM

## 2016-04-10 LAB — TSH: TSH: 0.37 u[IU]/mL (ref 0.35–4.50)

## 2016-05-06 DIAGNOSIS — H11123 Conjunctival concretions, bilateral: Secondary | ICD-10-CM | POA: Diagnosis not present

## 2016-05-06 DIAGNOSIS — H2513 Age-related nuclear cataract, bilateral: Secondary | ICD-10-CM | POA: Diagnosis not present

## 2016-05-13 NOTE — Progress Notes (Signed)
Cardiology Office Note  Date:  05/14/2016   ID:  Madison Huerta, Madison Huerta 1940/08/22, MRN [id number]  PCP:  Loura Pardon, MD   Chief Complaint  Patient presents with  . other    12 month follow up. Meds reviewed by the pt. verbally. "doing well."     HPI:  Madison Huerta is a pleasant 76 year old woman with history of breast cancer, radiation 20 years ago, recent admission to the hospital October 2016 for malaise, fevers, diagnosed with endocarditis treated with ABX through PICC line for 6 weeks under the guidance of Dr. Ola Spurr, also with small VSD noted on transesophageal echo. She presents for f/u of her endocarditis  In follow-up she reports it has been a stressful 6 or 7 months She sold her house within 1 day then moved into an apartment for 6 months Now moved into permanent home Lots of stress with moving and packing  Denies any significant leg swelling, shortness of breath, abdominal bloating, weight gain Overall feels she is doing very well Recently joined [facility]  denies any fevers concerning for recurrent infection. In the setting of Endocarditis in the past, She did feel weak, general malaise prior to treatment,    VSD since she was a child, With audible murmur,never caused any problems  Echocardiogram showing normal size right ventricle, normal pressures MRI Scan reviewed with her showing no significant plaque in the aorta   Tolerating Crestor 10 mg daily No prior smoking history, no diabetes  EKG on today's visit shows normal sinus rhythm with rate 73 bpm, no significant ST or T-wave changes    PMH:   has a past medical history of Breast cancer ([place]); Cataract; Endocarditis; GERD (gastroesophageal reflux disease); Heart murmur; History of breast cancer; Hyperlipidemia; Hypertension; Hypothyroidism; Osteopenia; Overweight(278.02); Thyroid disease; VSD (ventricular septal defect); and VSD (ventricular septal defect).  PSH:    Past Surgical History:  Procedure  Laterality Date  . 2D echo  2004   [facility] at [facility]  . AUGMENTATION MAMMAPLASTY Bilateral    removed 1994  . BREAST BIOPSY Left 1994   breast ca  . BREAST IMPLANT REMOVAL  1994  . BREAST LUMPECTOMY  1994   Radiation  . MASTECTOMY  01/08/2012   RIGHT  . MASTECTOMY W/ SENTINEL NODE BIOPSY  01/08/2012   Procedure: MASTECTOMY WITH SENTINEL LYMPH NODE BIOPSY;  Surgeon: Madilyn Hook, DO;  Location: [place];  Service: General;  Laterality: Right;  right breast mastectomy with sentinel lymph node biopsy   . PARATHYROIDECTOMY  12/2002  . TONSILLECTOMY     As a child  . TUBAL LIGATION     1976    Current Outpatient Prescriptions  Medication Sig Dispense Refill  . acetaminophen (TYLENOL) 500 MG tablet Take 500 mg by mouth every 4 (four) hours as needed for fever.    Marland Kitchen amLODipine (NORVASC) 5 MG tablet take 1 tablet by mouth once daily 30 tablet 11  . anastrozole (ARIMIDEX) 1 MG tablet take 1 tablet by mouth once daily 90 tablet 2  . cholecalciferol (VITAMIN D) 1000 units tablet Take 1,000 Units by mouth daily.    Marland Kitchen levothyroxine (SYNTHROID, LEVOTHROID) 75 MCG tablet Take 1 tablet (75 mcg total) by mouth daily. 30 tablet 11  . rosuvastatin (CRESTOR) 10 MG tablet Take 1 tablet (10 mg total) by mouth daily. 30 tablet 11   No current facility-administered medications for this visit.      Allergies:   Codeine; Penicillins; and Adhesive [tape]   Social History:  The patient  reports that she has never smoked. She has never used smokeless tobacco. She reports that she drinks alcohol. She reports that she does not use drugs.   Family History:   family history includes Aneurysm in her mother; Breast cancer in her cousin; Breast cancer (age of onset: 65) in her maternal aunt; Cancer in her cousin and father; Heart disease in her other; Lung cancer in her father.    Review of Systems: Review of Systems  Constitutional: Negative.   Respiratory: Negative.   Cardiovascular: Negative.    Gastrointestinal: Negative.   Musculoskeletal: Negative.   Neurological: Negative.   Psychiatric/Behavioral: Negative.   All other systems reviewed and are negative.    PHYSICAL EXAM: VS:  BP 136/60 (BP Location: Left Arm, Patient Position: Sitting, Cuff Size: Normal)   Pulse 74   Ht 5' (1.524 m)   Wt 125 lb 8 oz (56.9 kg)   BMI 24.51 kg/m  , BMI Body mass index is 24.51 kg/m. GEN: Well nourished, well developed, in no acute distress  HEENT: normal  Neck: no JVD, carotid bruits, or masses Cardiac: RRR; 2/6 SEM RSB, no  rubs, or gallops,no edema  Respiratory:  clear to auscultation bilaterally, normal work of breathing GI: soft, nontender, nondistended, + BS MS: no deformity or atrophy  Skin: warm and dry, no rash Neuro:  Strength and sensation are intact Psych: euthymic mood, full affect    Recent Labs: 02/21/2016: ALT 16; BUN 13; Creatinine, Ser 1.03; Hemoglobin 13.8; Platelets 260.0; Potassium 4.1; Sodium 141 04/10/2016: TSH 0.37    Lipid Panel Lab Results  Component Value Date   CHOL 192 02/21/2016   HDL 77.50 02/21/2016   LDLCALC 90 02/21/2016   TRIG 125.0 02/21/2016      Wt Readings from Last 3 Encounters:  05/14/16 125 lb 8 oz (56.9 kg)  02/28/16 128 lb 4 oz (58.2 kg)  02/21/16 127 lb (57.6 kg)       ASSESSMENT AND PLAN:  Pure hypercholesterolemia - Plan: EKG 12-Lead Cholesterol is at goal on the current lipid regimen. No changes to the medications were made. MRI scan reviewed showing full aorta from carotids through aortic arch and descending aorta No significant plaque noted  Essential hypertension - Plan: EKG 12-Lead Blood pressure is well controlled on today's visit. No changes made to the medications.  VSD (ventricular septal defect) - Plan: EKG 12-Lead Murmur on exam, no significant change to right ventricle, normal pressures on previous echocardiogram No further workup at this time. Recommended she look for leg swelling, abdominal  bloating, shortness of breath symptoms  History of endocarditis - Plan: EKG 12-Lead No evidence of recurrent disease, is doing well past 2 years  Disposition:   F/U  12 months   Total encounter time more than 25 minutes  Greater than 50% was spent in counseling and coordination of care with the patient    Orders Placed This Encounter  Procedures  . EKG 12-Lead     Signed, Esmond Plants, M.D., Ph.D. 05/14/2016  [place], [place]

## 2016-05-14 ENCOUNTER — Encounter: Payer: Self-pay | Admitting: Cardiovascular Disease

## 2016-05-14 ENCOUNTER — Ambulatory Visit (INDEPENDENT_AMBULATORY_CARE_PROVIDER_SITE_OTHER): Payer: Medicare HMO | Admitting: Cardiovascular Disease

## 2016-05-14 VITALS — BP 136/60 | HR 74 | Ht 60.0 in | Wt 125.5 lb

## 2016-05-14 DIAGNOSIS — I10 Essential (primary) hypertension: Secondary | ICD-10-CM | POA: Diagnosis not present

## 2016-05-14 DIAGNOSIS — Z8679 Personal history of other diseases of the circulatory system: Secondary | ICD-10-CM

## 2016-05-14 DIAGNOSIS — Q210 Ventricular septal defect: Secondary | ICD-10-CM

## 2016-05-14 DIAGNOSIS — E7800 Pure hypercholesterolemia, unspecified: Secondary | ICD-10-CM

## 2016-05-14 NOTE — Patient Instructions (Signed)

## 2016-05-18 ENCOUNTER — Inpatient Hospital Stay: Payer: Medicare HMO | Attending: Internal Medicine | Admitting: Internal Medicine

## 2016-05-18 ENCOUNTER — Encounter: Payer: Self-pay | Admitting: Internal Medicine

## 2016-05-18 ENCOUNTER — Inpatient Hospital Stay: Payer: Medicare HMO

## 2016-05-18 VITALS — BP 163/81 | HR 85 | Temp 97.3°F | Resp 20 | Ht 60.0 in | Wt 123.8 lb

## 2016-05-18 DIAGNOSIS — Z880 Allergy status to penicillin: Secondary | ICD-10-CM

## 2016-05-18 DIAGNOSIS — C50311 Malignant neoplasm of lower-inner quadrant of right female breast: Secondary | ICD-10-CM

## 2016-05-18 DIAGNOSIS — I10 Essential (primary) hypertension: Secondary | ICD-10-CM | POA: Insufficient documentation

## 2016-05-18 DIAGNOSIS — M899 Disorder of bone, unspecified: Secondary | ICD-10-CM | POA: Diagnosis not present

## 2016-05-18 DIAGNOSIS — E785 Hyperlipidemia, unspecified: Secondary | ICD-10-CM | POA: Diagnosis not present

## 2016-05-18 DIAGNOSIS — Z9223 Personal history of estrogen therapy: Secondary | ICD-10-CM | POA: Insufficient documentation

## 2016-05-18 DIAGNOSIS — Z853 Personal history of malignant neoplasm of breast: Secondary | ICD-10-CM | POA: Insufficient documentation

## 2016-05-18 DIAGNOSIS — Z803 Family history of malignant neoplasm of breast: Secondary | ICD-10-CM | POA: Diagnosis not present

## 2016-05-18 DIAGNOSIS — Z801 Family history of malignant neoplasm of trachea, bronchus and lung: Secondary | ICD-10-CM | POA: Insufficient documentation

## 2016-05-18 DIAGNOSIS — Q210 Ventricular septal defect: Secondary | ICD-10-CM

## 2016-05-18 DIAGNOSIS — Z885 Allergy status to narcotic agent status: Secondary | ICD-10-CM

## 2016-05-18 DIAGNOSIS — M8589 Other specified disorders of bone density and structure, multiple sites: Secondary | ICD-10-CM

## 2016-05-18 DIAGNOSIS — Z9011 Acquired absence of right breast and nipple: Secondary | ICD-10-CM | POA: Diagnosis not present

## 2016-05-18 DIAGNOSIS — Z923 Personal history of irradiation: Secondary | ICD-10-CM

## 2016-05-18 DIAGNOSIS — E039 Hypothyroidism, unspecified: Secondary | ICD-10-CM

## 2016-05-18 DIAGNOSIS — Z79811 Long term (current) use of aromatase inhibitors: Secondary | ICD-10-CM | POA: Insufficient documentation

## 2016-05-18 DIAGNOSIS — K219 Gastro-esophageal reflux disease without esophagitis: Secondary | ICD-10-CM | POA: Diagnosis not present

## 2016-05-18 DIAGNOSIS — Z79899 Other long term (current) drug therapy: Secondary | ICD-10-CM | POA: Diagnosis not present

## 2016-05-18 DIAGNOSIS — C50912 Malignant neoplasm of unspecified site of left female breast: Secondary | ICD-10-CM | POA: Diagnosis not present

## 2016-05-18 DIAGNOSIS — C50911 Malignant neoplasm of unspecified site of right female breast: Secondary | ICD-10-CM | POA: Diagnosis not present

## 2016-05-18 DIAGNOSIS — Z170 Estrogen receptor positive status [ER+]: Secondary | ICD-10-CM | POA: Insufficient documentation

## 2016-05-18 LAB — CBC WITH DIFFERENTIAL/PLATELET
Basophils Absolute: 0 10*3/uL (ref 0–0.1)
Basophils Relative: 1 %
Eosinophils Absolute: 0.1 10*3/uL (ref 0–0.7)
Eosinophils Relative: 1 %
HEMATOCRIT: 40 % (ref 35.0–47.0)
HEMOGLOBIN: 13.6 g/dL (ref 12.0–16.0)
LYMPHS ABS: 1.3 10*3/uL (ref 1.0–3.6)
LYMPHS PCT: 18 %
MCH: 30.5 pg (ref 26.0–34.0)
MCHC: 34.1 g/dL (ref 32.0–36.0)
MCV: 89.3 fL (ref 80.0–100.0)
MONOS PCT: 8 %
Monocytes Absolute: 0.6 10*3/uL (ref 0.2–0.9)
NEUTROS ABS: 5.2 10*3/uL (ref 1.4–6.5)
NEUTROS PCT: 72 %
Platelets: 247 10*3/uL (ref 150–440)
RBC: 4.48 MIL/uL (ref 3.80–5.20)
RDW: 12.6 % (ref 11.5–14.5)
WBC: 7.2 10*3/uL (ref 3.6–11.0)

## 2016-05-18 LAB — COMPREHENSIVE METABOLIC PANEL
ALK PHOS: 66 U/L (ref 38–126)
ALT: 19 U/L (ref 14–54)
ANION GAP: 10 (ref 5–15)
AST: 30 U/L (ref 15–41)
Albumin: 4.7 g/dL (ref 3.5–5.0)
BILIRUBIN TOTAL: 1 mg/dL (ref 0.3–1.2)
BUN: 16 mg/dL (ref 6–20)
CALCIUM: 9.7 mg/dL (ref 8.9–10.3)
CO2: 22 mmol/L (ref 22–32)
CREATININE: 0.91 mg/dL (ref 0.44–1.00)
Chloride: 108 mmol/L (ref 101–111)
GFR calc non Af Amer: >60 mL/min (ref >60)
GLUCOSE: 101 mg/dL — AB (ref 65–99)
Potassium: 4.1 mmol/L (ref 3.5–5.1)
Sodium: 140 mmol/L (ref 135–145)
TOTAL PROTEIN: 7.8 g/dL (ref 6.5–8.1)

## 2016-05-18 MED ORDER — ANASTROZOLE 1 MG PO TABS: 1.0000 mg | ORAL_TABLET | Freq: Every day | ORAL | 3 refills | Status: DC

## 2016-05-18 NOTE — Progress Notes (Signed)
Patient needs RF on Armidex. Also needs screening unilateral left breast cancer

## 2016-05-18 NOTE — Progress Notes (Signed)
[facility] OFFICE PROGRESS NOTE  Patient Care Team: Abner Greenspan, MD as PCP - General Cammie Sickle, MD as Consulting Physician (Internal Medicine) Karsten Fells, DDS as Consulting Physician (Dentistry) Kathyrn Lass, OD as Consulting Physician (Optometry)   SUMMARY OF ONCOLOGIC HISTORY:  Oncology History   # 1994- LEFT BREAST CA s/p Lumpec; RT- Tam  # OCT 2013-  RIGHT BREAST CA pT1c [multifocal-1.2cm s/p Mastec] pN0 ER/PR-pos;Her 2Neu-NEG;Dec 2013- Arimidex; mammo-Sep 2017-N   # SEP 2016- DEXA scan- low bone/improved [compared to 2014]  # BRCA- testing?      Carcinoma of lower-inner quadrant of right breast in female, estrogen receptor positive ([place])      INTERVAL HISTORY:  A very pleasant 76 year old female patient with above history of breast cancer- right side stage I ER/PR positive; her 2 Neu-NEG currently on Arimidex is here for follow-up.  Patient interested in knowing the length of her antiestrogen treatment. Patient denies any new lumps or bumps. Appetite is good. Denies any bone pain. No weight loss. No shortness of breath or cough.  REVIEW OF SYSTEMS:  A complete 10 point review of system is done which is negative except mentioned above/history of present illness.   PAST MEDICAL HISTORY :  Past Medical History:  Diagnosis Date  . Breast cancer ([place])    L breast- 1994- lumpectomy, care for at [facility]   . Cataract   . Endocarditis   . GERD (gastroesophageal reflux disease)   . Heart murmur    VSD- echocardiogram - [facility] 10 yrs. ago  . History of breast cancer   . Hyperlipidemia   . Hypertension   . Hypothyroidism   . Osteopenia   . Overweight(278.02)   . Thyroid disease    Hypothyroidism  . VSD (ventricular septal defect)    does need SBE prophylaxis (Keflex  3 gm 1 hour pprior and 1.5 gm 6 hours post  . VSD (ventricular septal defect)     PAST SURGICAL HISTORY :   Past Surgical History:  Procedure Laterality Date  . 2D echo   2004   Normal at [facility]  . AUGMENTATION MAMMAPLASTY Bilateral    removed 1994  . BREAST BIOPSY Left 1994   breast ca  . BREAST IMPLANT REMOVAL  1994  . BREAST LUMPECTOMY  1994   Radiation  . MASTECTOMY  01/08/2012   RIGHT  . MASTECTOMY W/ SENTINEL NODE BIOPSY  01/08/2012   Procedure: MASTECTOMY WITH SENTINEL LYMPH NODE BIOPSY;  Surgeon: Madilyn Hook, DO;  Location: [place];  Service: General;  Laterality: Right;  right breast mastectomy with sentinel lymph node biopsy   . PARATHYROIDECTOMY  12/2002  . TONSILLECTOMY     As a child  . TUBAL LIGATION     1976    FAMILY HISTORY :   Family History  Problem Relation Age of Onset  . Aneurysm Mother     brain  . Cancer Father     Lung, Liver mets  . Lung cancer Father 44  . Heart disease Other   . Cancer Cousin     Breast CA  . Breast cancer Cousin 30  . Breast cancer Maternal Aunt 55    SOCIAL HISTORY:   Social History  Substance Use Topics  . Smoking status: Never Smoker  . Smokeless tobacco: Never Used  . Alcohol use 0.0 oz/week     Comment: 1 glass of wine occ    ALLERGIES:  is allergic to codeine; penicillins; and adhesive [tape].  MEDICATIONS:  Current Outpatient Prescriptions  Medication Sig Dispense Refill  . amLODipine (NORVASC) 5 MG tablet take 1 tablet by mouth once daily 30 tablet 11  . anastrozole (ARIMIDEX) 1 MG tablet Take 1 tablet (1 mg total) by mouth daily. 90 tablet 3  . cholecalciferol (VITAMIN D) 1000 units tablet Take 1,000 Units by mouth daily.    Marland Kitchen levothyroxine (SYNTHROID, LEVOTHROID) 75 MCG tablet Take 1 tablet (75 mcg total) by mouth daily. 30 tablet 11  . rosuvastatin (CRESTOR) 10 MG tablet Take 1 tablet (10 mg total) by mouth daily. 30 tablet 11  . acetaminophen (TYLENOL) 500 MG tablet Take 500 mg by mouth every 4 (four) hours as needed for fever.     No current facility-administered medications for this visit.     PHYSICAL EXAMINATION: ECOG PERFORMANCE STATUS: 0 - Asymptomatic  BP (!)  163/81 (Patient Position: Sitting)   Pulse 85   Temp 97.3 F (36.3 C) (Tympanic)   Resp 20   Ht 5' (1.524 m)   Wt 123 lb 12.8 oz (56.2 kg)   BMI 24.18 kg/m   Filed Weights   05/18/16 1031  Weight: 123 lb 12.8 oz (56.2 kg)    GENERAL: Well-nourished well-developed; Alert, no distress and comfortable.  Alone.  EYES: no pallor or icterus OROPHARYNX: no thrush or ulceration; good dentition  NECK: supple, no masses felt LYMPH:  no palpable lymphadenopathy in the cervical, axillary or inguinal regions LUNGS: clear to auscultation and  No wheeze or crackles HEART/CVS: regular rate & rhythm and positive for murmurs; No lower extremity edema ABDOMEN:abdomen soft, non-tender and normal bowel sounds Musculoskeletal:no cyanosis of digits and no clubbing  PSYCH: alert & oriented x 3 with fluent speech NEURO: no focal motor/sensory deficits SKIN:  no rashes or significant lesions  LABORATORY DATA:  I have reviewed the data as listed    Component Value Date/Time   NA 140 05/18/2016 1010   NA 144 05/05/2013 0807   K 4.1 05/18/2016 1010   K 3.8 05/05/2013 0807   CL 108 05/18/2016 1010   CL 105 05/06/2012 1433   CO2 22 05/18/2016 1010   CO2 25 05/05/2013 0807   GLUCOSE 101 (H) 05/18/2016 1010   GLUCOSE 97 05/05/2013 0807   GLUCOSE 93 05/06/2012 1433   BUN 16 05/18/2016 1010   BUN 15.6 05/05/2013 0807   CREATININE 0.91 05/18/2016 1010   CREATININE 1.06 11/13/2013 1247   CREATININE 1.0 05/05/2013 0807   CALCIUM 9.7 05/18/2016 1010   CALCIUM 9.7 05/05/2013 0807   PROT 7.8 05/18/2016 1010   PROT 8.0 11/13/2013 1247   PROT 7.8 05/05/2013 0807   ALBUMIN 4.7 05/18/2016 1010   ALBUMIN 4.6 11/13/2013 1247   ALBUMIN 4.5 05/05/2013 0807   AST 30 05/18/2016 1010   AST 32 11/13/2013 1247   AST 27 05/05/2013 0807   ALT 19 05/18/2016 1010   ALT 31 11/13/2013 1247   ALT 19 05/05/2013 0807   ALKPHOS 66 05/18/2016 1010   ALKPHOS 58 11/13/2013 1247   ALKPHOS 79 05/05/2013 0807   BILITOT  1.0 05/18/2016 1010   BILITOT 0.7 11/13/2013 1247   BILITOT 0.64 05/05/2013 0807   GFRNONAA >60 05/18/2016 1010   GFRNONAA 53 (L) 11/13/2013 1247   GFRAA >60 05/18/2016 1010   GFRAA >60 11/13/2013 1247    No results found for: SPEP, UPEP  Lab Results  Component Value Date   WBC 7.2 05/18/2016   NEUTROABS 5.2 05/18/2016   HGB 13.6 05/18/2016  HCT 40.0 05/18/2016   MCV 89.3 05/18/2016   PLT 247 05/18/2016      Chemistry      Component Value Date/Time   NA 140 05/18/2016 1010   NA 144 05/05/2013 0807   K 4.1 05/18/2016 1010   K 3.8 05/05/2013 0807   CL 108 05/18/2016 1010   CL 105 05/06/2012 1433   CO2 22 05/18/2016 1010   CO2 25 05/05/2013 0807   BUN 16 05/18/2016 1010   BUN 15.6 05/05/2013 0807   CREATININE 0.91 05/18/2016 1010   CREATININE 1.06 11/13/2013 1247   CREATININE 1.0 05/05/2013 0807      Component Value Date/Time   CALCIUM 9.7 05/18/2016 1010   CALCIUM 9.7 05/05/2013 0807   ALKPHOS 66 05/18/2016 1010   ALKPHOS 58 11/13/2013 1247   ALKPHOS 79 05/05/2013 0807   AST 30 05/18/2016 1010   AST 32 11/13/2013 1247   AST 27 05/05/2013 0807   ALT 19 05/18/2016 1010   ALT 31 11/13/2013 1247   ALT 19 05/05/2013 0807   BILITOT 1.0 05/18/2016 1010   BILITOT 0.7 11/13/2013 1247   BILITOT 0.64 05/05/2013 0807        ASSESSMENT & PLAN:   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 Currently on adjuvant Arimidex. Clinically no evidence of recurrence noted. Discussed re: Breast cancer index. Pt agrees.   # Left breast cancer status post lumpectomy [1994]- mammogram negative in September 2017.  #  DEXA scan 2016 - low bone mass /however improvement noted from 2014. Repeat in Sep 2018/with mammogram of Left side.  # genetic testing- since  patient had breast cancer twice she may see NCCN guidelines for genetic testing. Discussed the potential implications of a positive test.pt agrees.   # Follow up in 2 months or so to discuss the results of above work up.   05/19/2016 2:49 PM

## 2016-05-18 NOTE — Assessment & Plan Note (Addendum)
#  RIGHT breast cancer [October 2013] stage I status post mastectomy; ER/PR positive HER-2/neu negative [Oncotype recurrent score 16;  the risk of distant recurrence 10%]. Currently on adjuvant Arimidex. Clinically no evidence of recurrence noted. Discussed re: Breast cancer index. Pt agrees.   # Left breast cancer status post lumpectomy [1994]- mammogram negative in September 2017.  #  DEXA scan 2016 - low bone mass /however improvement noted from 2014. Repeat in Sep 2018/with mammogram of Left side.  # genetic testing- since patient had breast cancer twice she may see NCCN guidelines for genetic testing. Discussed the potential implications of a positive test.pt agrees.   # Follow up in 2 months or so to discuss the results of above work up.

## 2016-05-27 ENCOUNTER — Encounter: Payer: Self-pay | Admitting: *Deleted

## 2016-05-27 NOTE — Progress Notes (Signed)
Prior auth required for braca-myriad my risk testing. Form completed and will be faxed/submitted  back to pt's [employer]. 1 [phone]

## 2016-05-28 ENCOUNTER — Encounter: Payer: Self-pay | Admitting: Internal Medicine

## 2016-05-28 DIAGNOSIS — C50311 Malignant neoplasm of lower-inner quadrant of right female breast: Secondary | ICD-10-CM | POA: Diagnosis not present

## 2016-06-04 ENCOUNTER — Encounter (HOSPITAL_COMMUNITY): Payer: Self-pay

## 2016-06-17 ENCOUNTER — Telehealth: Payer: Self-pay | Admitting: *Deleted

## 2016-06-17 NOTE — Telephone Encounter (Signed)
Patient and pt's husband called cancer center requesting call back to further discuss the letters he received in the mail for molecular and genetic testing.  Return call and RN spoke to patient and patient's husband. Pt specifically requested information on why [facility] was ordered and out of pocket cost for test.  Husband stated that he didn't know if the genetic myriad testing was the same as the[employer] testing.  Husband states that he received a letter from [employer] [place] stating the the myriad testing was approved. [facility] requested pt to sign a form to process the claim for her insurance. Husband concerned about the cost of the [employer]. pt has not yet received the EOB statement from [employer]. Pt has the phone number for [employer]; however, I encouraged pt's husband to contact the insurance company for the EOB and to further concerns on the billing.  I explained the purpose of each test and the rationale for why md ordered the tests.

## 2016-07-13 ENCOUNTER — Inpatient Hospital Stay: Payer: Medicare HMO | Attending: Internal Medicine | Admitting: Internal Medicine

## 2016-07-13 VITALS — BP 163/76 | HR 86 | Temp 98.0°F | Ht 60.0 in | Wt 128.0 lb

## 2016-07-13 DIAGNOSIS — Z170 Estrogen receptor positive status [ER+]: Secondary | ICD-10-CM

## 2016-07-13 DIAGNOSIS — E039 Hypothyroidism, unspecified: Secondary | ICD-10-CM | POA: Diagnosis not present

## 2016-07-13 DIAGNOSIS — Z801 Family history of malignant neoplasm of trachea, bronchus and lung: Secondary | ICD-10-CM | POA: Diagnosis not present

## 2016-07-13 DIAGNOSIS — Z9223 Personal history of estrogen therapy: Secondary | ICD-10-CM | POA: Diagnosis not present

## 2016-07-13 DIAGNOSIS — Z808 Family history of malignant neoplasm of other organs or systems: Secondary | ICD-10-CM | POA: Diagnosis not present

## 2016-07-13 DIAGNOSIS — Z803 Family history of malignant neoplasm of breast: Secondary | ICD-10-CM | POA: Insufficient documentation

## 2016-07-13 DIAGNOSIS — Z9011 Acquired absence of right breast and nipple: Secondary | ICD-10-CM | POA: Diagnosis not present

## 2016-07-13 DIAGNOSIS — Z880 Allergy status to penicillin: Secondary | ICD-10-CM | POA: Diagnosis not present

## 2016-07-13 DIAGNOSIS — M8580 Other specified disorders of bone density and structure, unspecified site: Secondary | ICD-10-CM

## 2016-07-13 DIAGNOSIS — Z79811 Long term (current) use of aromatase inhibitors: Secondary | ICD-10-CM | POA: Diagnosis not present

## 2016-07-13 DIAGNOSIS — E785 Hyperlipidemia, unspecified: Secondary | ICD-10-CM | POA: Diagnosis not present

## 2016-07-13 DIAGNOSIS — Q210 Ventricular septal defect: Secondary | ICD-10-CM

## 2016-07-13 DIAGNOSIS — Z885 Allergy status to narcotic agent status: Secondary | ICD-10-CM

## 2016-07-13 DIAGNOSIS — I10 Essential (primary) hypertension: Secondary | ICD-10-CM | POA: Diagnosis not present

## 2016-07-13 DIAGNOSIS — Z853 Personal history of malignant neoplasm of breast: Secondary | ICD-10-CM | POA: Insufficient documentation

## 2016-07-13 DIAGNOSIS — C50311 Malignant neoplasm of lower-inner quadrant of right female breast: Secondary | ICD-10-CM | POA: Diagnosis not present

## 2016-07-13 DIAGNOSIS — Z79899 Other long term (current) drug therapy: Secondary | ICD-10-CM | POA: Insufficient documentation

## 2016-07-13 DIAGNOSIS — K219 Gastro-esophageal reflux disease without esophagitis: Secondary | ICD-10-CM | POA: Diagnosis not present

## 2016-07-13 DIAGNOSIS — Z923 Personal history of irradiation: Secondary | ICD-10-CM

## 2016-07-13 NOTE — Progress Notes (Signed)
[place] OFFICE PROGRESS NOTE  Patient Care Team: Tower, Wynelle Fanny, MD as PCP - General Cammie Sickle, MD as Consulting Physician (Internal Medicine) Karsten Fells, DDS as Consulting Physician (Dentistry) Kathyrn Lass, OD as Consulting Physician (Optometry)   SUMMARY OF ONCOLOGIC HISTORY:  Oncology History   # 1994- LEFT BREAST CA s/p Lumpec; RT- Tam  # OCT 2013-  RIGHT BREAST CA pT1c [multifocal-1.2cm s/p Mastec] pN0 ER/PR-pos;Her 2Neu-NEG;Dec 2013- Arimidex; mammo-Sep 2017-N; BREAST CANCER INDEX- BENEFIT from extended AI.   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  Patient denies any new lumps or bumps. Appetite is good. Denies any bone pain. No weight loss. No shortness of breath or cough.  REVIEW OF SYSTEMS:  A complete 10 point review of system is done which is negative except mentioned above/history of present illness.   PAST MEDICAL HISTORY :  Past Medical History:  Diagnosis Date  . Breast cancer ([place])    L breast- 1994- lumpectomy, care [facility] at [facility]   . Cataract   . Endocarditis   . GERD (gastroesophageal reflux disease)   . Heart murmur    VSD- echocardiogram - [facility] 10 yrs. ago  . History of breast cancer   . Hyperlipidemia   . Hypertension   . Hypothyroidism   . Osteopenia   . Overweight(278.02)   . Thyroid disease    Hypothyroidism  . VSD (ventricular septal defect)    does need SBE prophylaxis (Keflex  3 gm 1 hour pprior and 1.5 gm 6 hours post  . VSD (ventricular septal defect)      PAST SURGICAL HISTORY :   Past Surgical History:  Procedure Laterality Date  . 2D echo  2004   Normal at [facility]  . AUGMENTATION MAMMAPLASTY Bilateral    removed 1994  . BREAST BIOPSY Left 1994   breast ca  . BREAST IMPLANT REMOVAL  1994  . BREAST LUMPECTOMY  1994   Radiation  . MASTECTOMY  01/08/2012   RIGHT  . MASTECTOMY W/ SENTINEL NODE BIOPSY  01/08/2012   Procedure: MASTECTOMY WITH SENTINEL LYMPH NODE BIOPSY;  Surgeon: Madilyn Hook, DO;  Location: [facility];  Service: General;  Laterality: Right;  right breast mastectomy with sentinel lymph node biopsy   . PARATHYROIDECTOMY  12/2002  . TONSILLECTOMY     As a child  . TUBAL LIGATION     1976    FAMILY HISTORY :   Family History  Problem Relation Age of Onset  . Aneurysm Mother        brain  . Cancer Father        Lung, Liver mets  . Lung cancer Father 67  . Heart disease Other   . Cancer Cousin        Breast CA  . Breast cancer Cousin 30  . Breast cancer Maternal Aunt 55    SOCIAL HISTORY:   Social History  Substance Use Topics  . Smoking status: Never Smoker  . Smokeless tobacco: Never Used  .  Alcohol use 0.0 oz/week     Comment: 1 glass of wine occ    ALLERGIES:  is allergic to codeine; penicillins; and adhesive [tape].  MEDICATIONS:  Current Outpatient Prescriptions  Medication Sig Dispense Refill  . acetaminophen (TYLENOL) 500 MG tablet Take 500 mg by mouth every 4 (four) hours as needed for fever.    Marland Kitchen amLODipine (NORVASC) 5 MG tablet take 1 tablet by mouth once daily 30 tablet 11  . anastrozole (ARIMIDEX) 1 MG tablet Take 1 tablet (1 mg total) by mouth daily. 90 tablet 3  . cholecalciferol (VITAMIN D) 1000 units tablet Take 1,000 Units by mouth daily.    Marland Kitchen levothyroxine (SYNTHROID, LEVOTHROID) 75 MCG tablet Take 1 tablet (75 mcg total) by mouth daily. 30 tablet 11  . rosuvastatin (CRESTOR) 10 MG tablet Take 1 tablet (10 mg total) by mouth daily. 30 tablet 11   No current facility-administered  medications for this visit.     PHYSICAL EXAMINATION: ECOG PERFORMANCE STATUS: 0 - Asymptomatic  BP (!) 163/76 (BP Location: Left Arm, Patient Position: Sitting)   Pulse 86   Temp 98 F (36.7 C) (Tympanic)   Ht 5' (1.524 m)   Wt 128 lb (58.1 kg)   BMI 25.00 kg/m   Filed Weights   07/13/16 1447  Weight: 128 lb (58.1 kg)    GENERAL: Well-nourished well-developed; Alert, no distress and comfortable.  Alone.  EYES: no pallor or icterus OROPHARYNX: no thrush or ulceration; good dentition  NECK: supple, no masses felt LYMPH:  no palpable lymphadenopathy in the cervical, axillary or inguinal regions LUNGS: clear to auscultation and  No wheeze or crackles HEART/CVS: regular rate & rhythm and positive for murmurs; No lower extremity edema ABDOMEN:abdomen soft, non-tender and normal bowel sounds Musculoskeletal:no cyanosis of digits and no clubbing  PSYCH: alert & oriented x 3 with fluent speech NEURO: no focal motor/sensory deficits SKIN:  no rashes or significant lesions  LABORATORY DATA:  I have reviewed the data as listed    Component Value Date/Time   NA 140 05/18/2016 1010   NA 144 05/05/2013 0807   K 4.1 05/18/2016 1010   K 3.8 05/05/2013 0807   CL 108 05/18/2016 1010   CL 105 05/06/2012 1433   CO2 22 05/18/2016 1010   CO2 25 05/05/2013 0807   GLUCOSE 101 (H) 05/18/2016 1010   GLUCOSE 97 05/05/2013 0807   GLUCOSE 93 05/06/2012 1433   BUN 16 05/18/2016 1010   BUN 15.6 05/05/2013 0807   CREATININE 0.91 05/18/2016 1010   CREATININE 1.06 11/13/2013 1247   CREATININE 1.0 05/05/2013 0807   CALCIUM 9.7 05/18/2016 1010   CALCIUM 9.7 05/05/2013 0807   PROT 7.8 05/18/2016 1010   PROT 8.0 11/13/2013 1247   PROT 7.8 05/05/2013 0807   ALBUMIN 4.7 05/18/2016 1010   ALBUMIN 4.6 11/13/2013 1247   ALBUMIN 4.5 05/05/2013 0807   AST 30 05/18/2016 1010   AST 32 11/13/2013 1247   AST 27 05/05/2013 0807   ALT 19 05/18/2016 1010   ALT 31 11/13/2013 1247   ALT 19 05/05/2013  0807   ALKPHOS 66 05/18/2016 1010   ALKPHOS 58 11/13/2013 1247   ALKPHOS 79 05/05/2013 0807   BILITOT 1.0 05/18/2016 1010   BILITOT 0.7 11/13/2013 1247   BILITOT 0.64 05/05/2013 0807   GFRNONAA >60 05/18/2016 1010   GFRNONAA 53 (L) 11/13/2013 1247   GFRAA >60 05/18/2016 1010   GFRAA >60 11/13/2013 1247    No results found for: SPEP, UPEP  Lab Results  Component Value Date   WBC 7.2 05/18/2016   NEUTROABS 5.2 05/18/2016   HGB 13.6 05/18/2016   HCT 40.0 05/18/2016   MCV 89.3 05/18/2016   PLT 247 05/18/2016      Chemistry      Component Value Date/Time   NA 140 05/18/2016 1010   NA 144 05/05/2013 0807   K 4.1 05/18/2016 1010   K 3.8 05/05/2013 0807   CL 108 05/18/2016 1010   CL 105 05/06/2012 1433   CO2 22 05/18/2016 1010   CO2 25 05/05/2013 0807   BUN 16 05/18/2016 1010   BUN 15.6 05/05/2013 0807   CREATININE 0.91 05/18/2016 1010   CREATININE 1.06 11/13/2013 1247   CREATININE 1.0 05/05/2013 0807      Component Value Date/Time   CALCIUM 9.7 05/18/2016 1010   CALCIUM 9.7 05/05/2013 0807   ALKPHOS 66 05/18/2016 1010   ALKPHOS 58 11/13/2013 1247   ALKPHOS 79 05/05/2013 0807   AST 30 05/18/2016 1010   AST 32 11/13/2013 1247   AST 27 05/05/2013 0807   ALT 19 05/18/2016 1010   ALT 31 11/13/2013 1247   ALT 19 05/05/2013 0807   BILITOT 1.0 05/18/2016 1010   BILITOT 0.7 11/13/2013 1247   BILITOT 0.64 05/05/2013 0807        ASSESSMENT & PLAN:   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 Currently on adjuvant Arimidex. Clinically no evidence of recurrence noted. Reviewed the Breast cancer index-risk of late of distant recurrence 7.8%; would benefit from an extended antihormone therapy. Discussed the patient she is in agreement.  #  DEXA scan 2016  - low bone mass /however improvement noted from 2014. Repeat in Sep 2018/with mammogram.   # My risk- BRCA/others- negative. Reviewed the results the patient in detail.  # follow up in 12 months/ no labs- mamo-dexa sep 2018 [will call if abnormal].   07/20/2016 4:34 PM

## 2016-07-13 NOTE — Progress Notes (Signed)
Patient here for results no changes since last appointment. 

## 2016-07-13 NOTE — Assessment & Plan Note (Addendum)
#  RIGHT breast cancer [October 2013] stage I status post mastectomy; ER/PR positive HER-2/neu negative [Oncotype recurrent score 16;  the risk of distant recurrence 10%]. Currently on adjuvant Arimidex. Clinically no evidence of recurrence noted. Reviewed the Breast cancer index-risk of late of distant recurrence 7.8%; would benefit from an extended antihormone therapy. Discussed the patient she is in agreement.  #  DEXA scan 2016 - low bone mass /however improvement noted from 2014. Repeat in Sep 2018/with mammogram.   # My risk- BRCA/others- negative. Reviewed the results the patient in detail.  # follow up in 12 months/ no labs- mamo-dexa sep 2018 [will call if abnormal].

## 2016-08-10 ENCOUNTER — Encounter: Payer: Self-pay | Admitting: Internal Medicine

## 2016-08-21 IMAGING — MR MR THORACIC SPINE W/O CM
6 series · 48 of 48 positions shown · non-contrast
Comparison: Chest radiographs [DATE] and earlier.

CLINICAL DATA: 74-year-old female with fever, chills, diaphoresis,
thoracic spine pain. Viridans strep positive blood cultures and
endocarditis with aortic valve vegetation.

EXAM:
MRI THORACIC SPINE WITHOUT CONTRAST
TECHNIQUE: Multiplanar, multisequence MR imaging of the thoracic spine was
performed. No intravenous contrast was administered.

[Series 4: T2 · sagittal · 4.0mm · 1.33mm/px · 4 of 13 slices shown (1 of 2)]
[im 1/13]
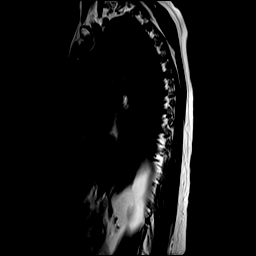
[im 5/13]
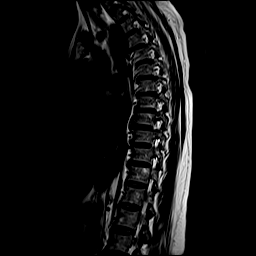
[im 9/13]
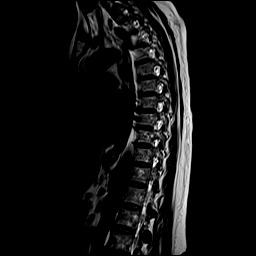
[im 13/13]
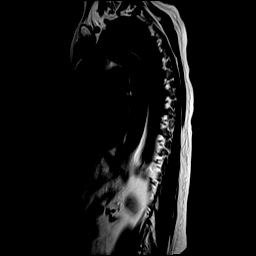

[Series 5: T1 · sagittal · 4.0mm · 1.33mm/px · 4 of 13 slices shown]
[im 1/13]
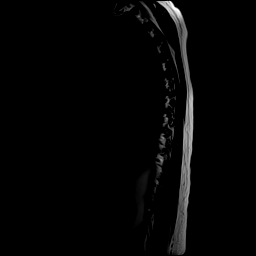
[im 5/13]
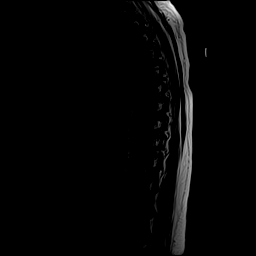
[im 9/13]
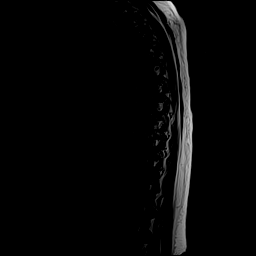
[im 13/13]
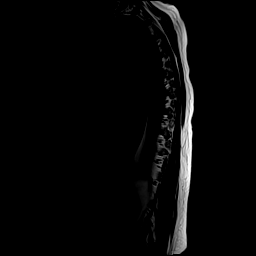

[Series 6: STIR · sagittal · 4.0mm · 1.33mm/px · 5 of 13 slices shown]
[im 1/13]
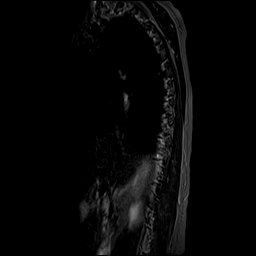
[im 4/13]
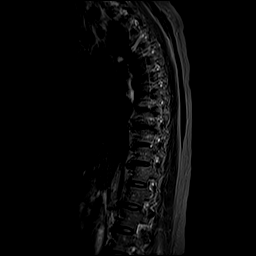
[im 7/13]
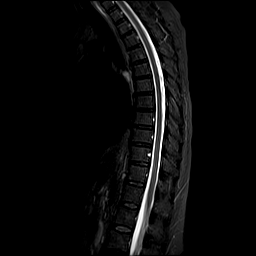
[im 10/13]
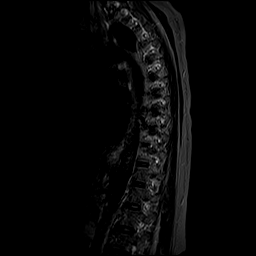
[im 13/13]
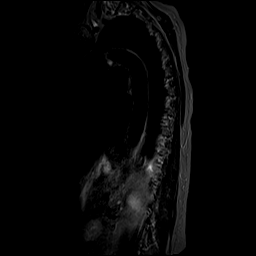

[Series 7: T2 · axial · 5.0mm · 0.86mm/px · z∈[-206,+41]mm · 16 of 42 slices shown (2 of 2)]
[im 1/42]
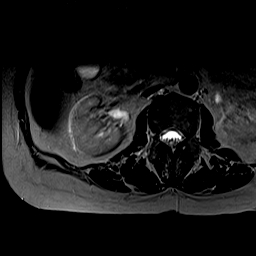
[im 3/42]
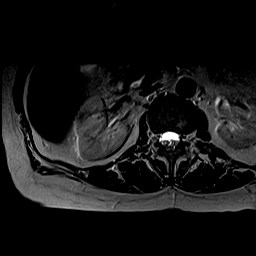
[im 6/42]
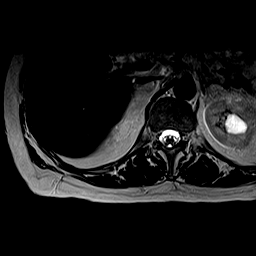
[im 9/42]
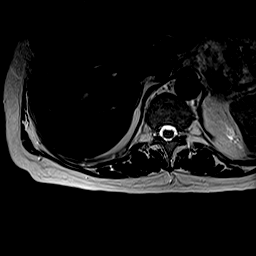
[im 11/42]
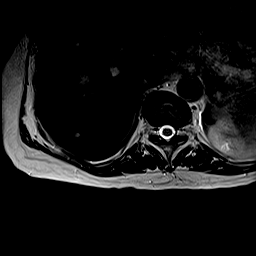
[im 14/42]
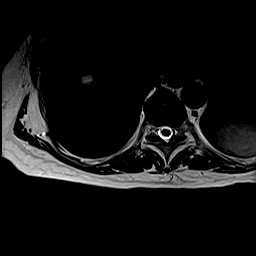
[im 17/42]
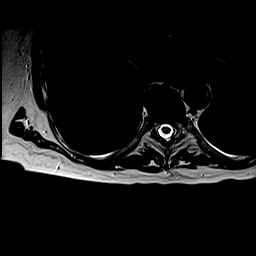
[im 20/42]
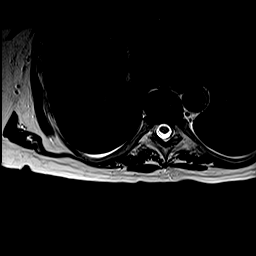
[im 22/42]
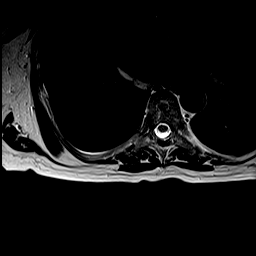
[im 25/42]
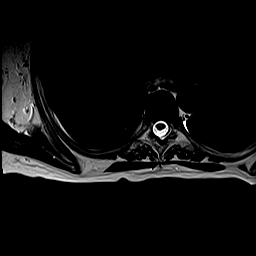
[im 28/42]
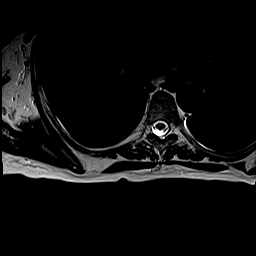
[im 31/42]
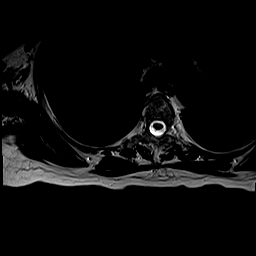
[im 33/42]
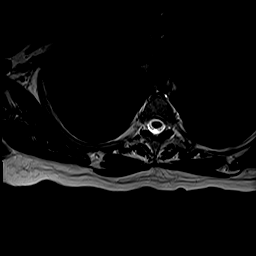
[im 36/42]
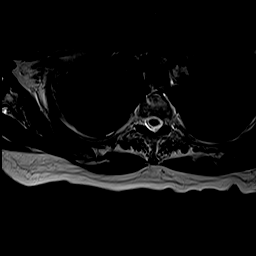
[im 39/42]
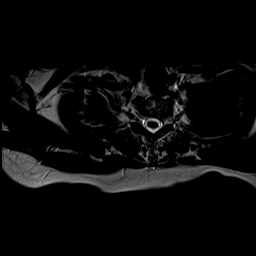
[im 42/42]
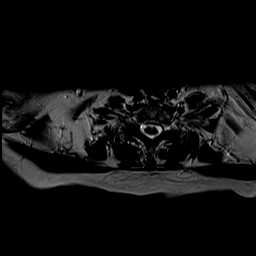

[Series 8: mpgr ax (3 · axial · 5.0mm · 0.78mm/px · z∈[-216,+38]mm · 16 of 42 slices shown]
[im 1/42]
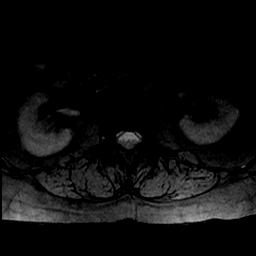
[im 3/42]
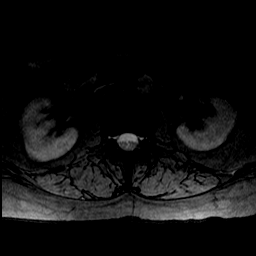
[im 6/42]
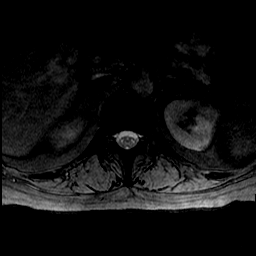
[im 9/42]
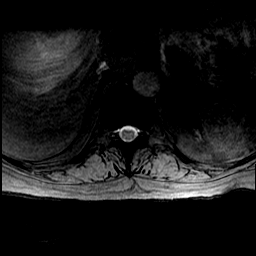
[im 11/42]
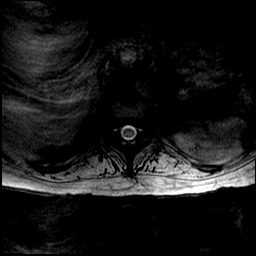
[im 14/42]
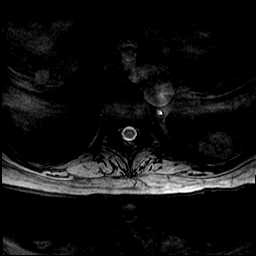
[im 17/42]
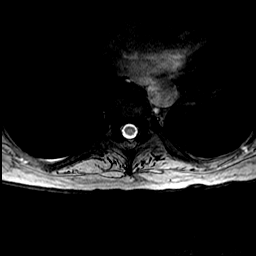
[im 20/42]
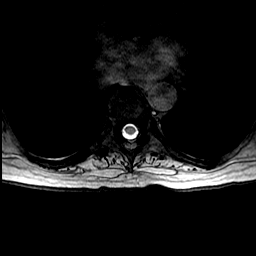
[im 22/42]
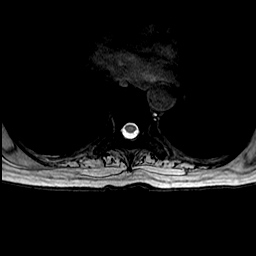
[im 25/42]
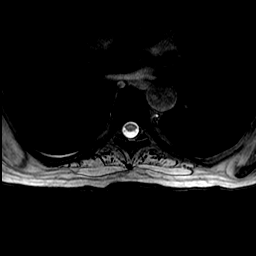
[im 28/42]
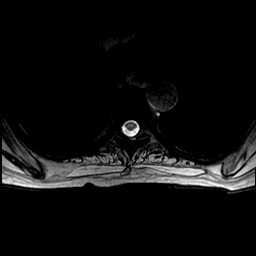
[im 31/42]
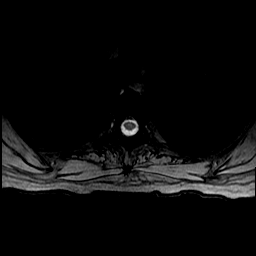
[im 33/42]
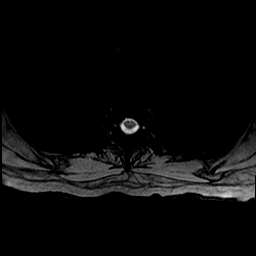
[im 36/42]
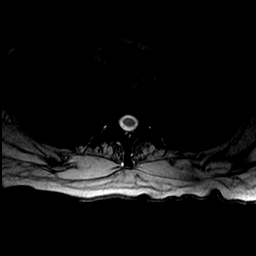
[im 39/42]
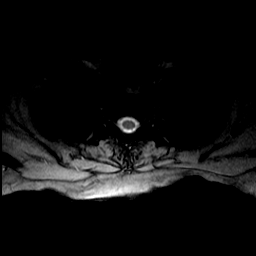
[im 42/42]
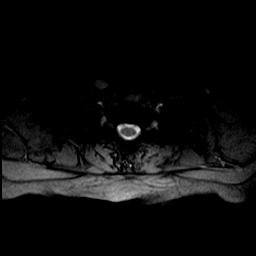

[Series 100: counting loc · sagittal · 5.0mm · 0.68mm/px · 3 of 7 slices shown]
[im 1/7]
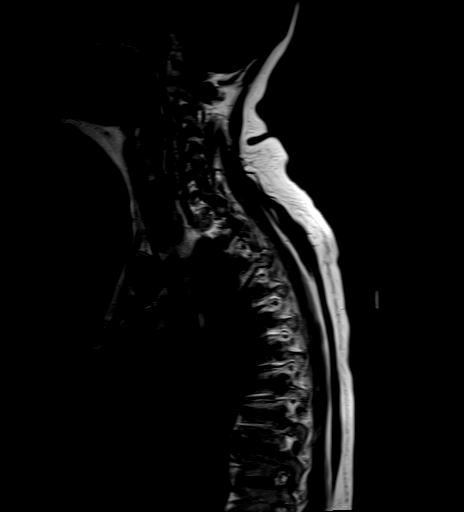
[im 4/7]
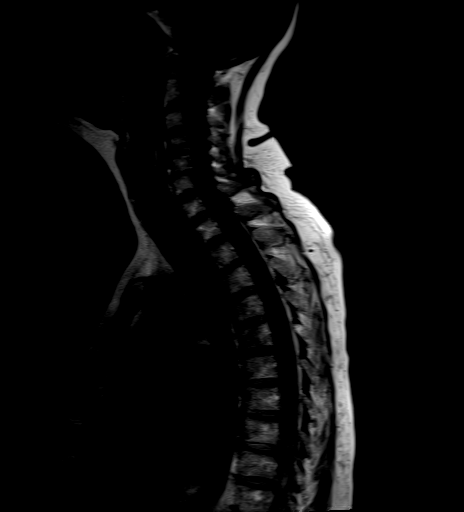
[im 7/7]
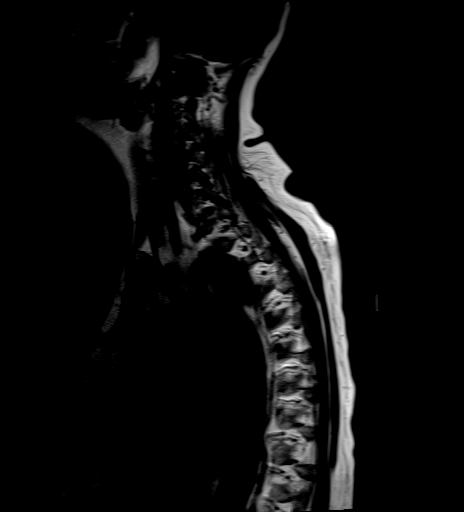

[48 of 48 positions shown; findings below may reference images not displayed]

FINDINGS: Limited sagittal imaging of the cervical spine is remarkable for
lower cervical disc degeneration with disc space loss, and mild
retrolisthesis at C5-C6. Cervical spine marrow signal appears
normal.

Preserved thoracic vertebral height and alignment. Mildly
heterogeneous thoracic bone marrow signal, but no marrow edema or
evidence of acute osseous abnormality. Thoracic intervertebral disc
signal and morphology is normal for age throughout. No inflammatory
signal within the thoracic or visualized upper lumbar discs.

No thoracic spinal stenosis. Spinal cord signal is within normal
limits at all visualized levels. Conus medullaris appears normal at
T12-L1.

Visualized thorax negative for pericardial or pleural effusion.
There are T2 hyperintense lesions in the liver dome measuring up to
14 mm. These are circumscribed and probably are benign cysts.
Similar probable benign left renal parapelvic cyst. Small gastric
hiatal hernia suspected.

Posterior paraspinal soft tissues appear normal.
IMPRESSION: 1. Negative for age thoracic spine. No acute or inflammatory finding
identified.
2. Probable benign cysts in the liver and left kidney.

## 2016-08-24 IMAGING — CR DG CHEST 1V PORT
1 series · 1 of 1 positions shown · non-contrast
Comparison: 11/13/2014

CLINICAL DATA: Status post PICC line placement

EXAM:
PORTABLE CHEST - 1 VIEW

[ap]
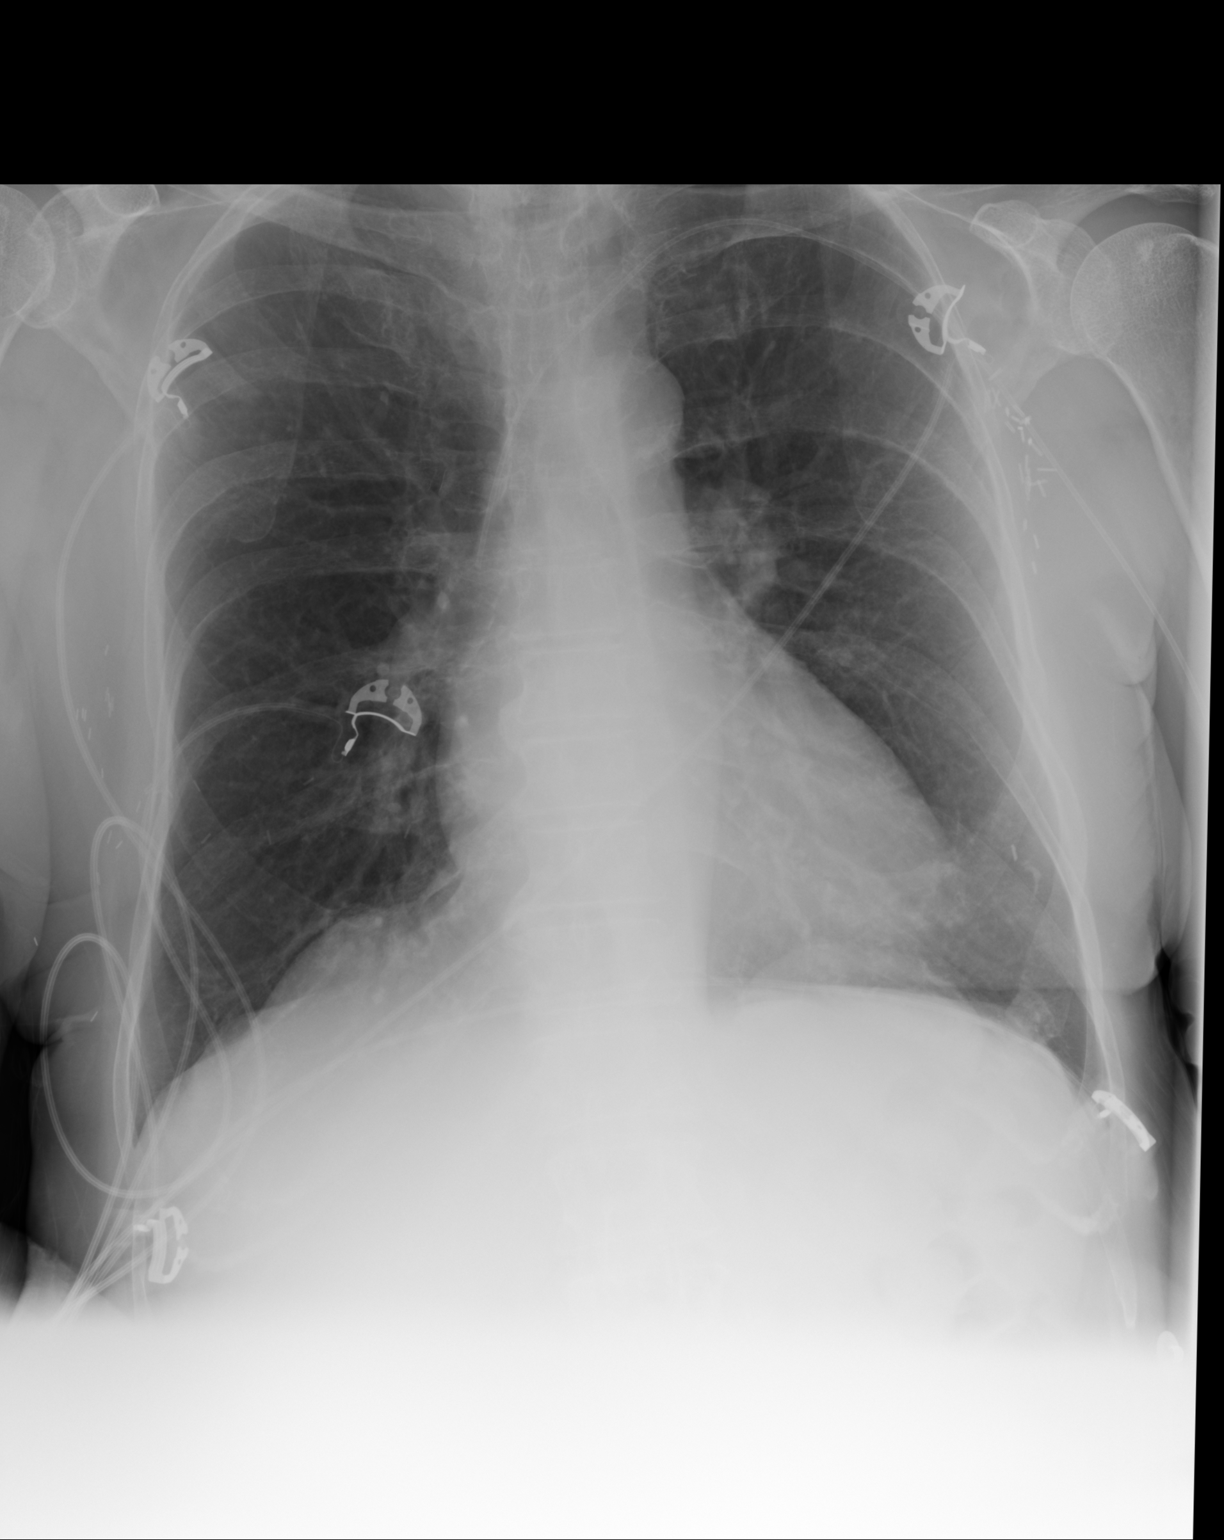

[1 of 1 positions shown; findings below may reference images not displayed]

FINDINGS: Cardiac shadow is within normal limits. A left-sided PICC line is
seen with the catheter tip in the mid superior vena cava. This could
be advanced 2.5 cm as clinically indicated. The lungs are clear.
Postsurgical changes are noted in the axilla bilaterally.
IMPRESSION: Left-sided PICC line with the catheter tip in the mid superior vena
cava. This could be advanced 2.5 cm as clinically necessary to the
cavoatrial junction.

## 2016-10-20 DIAGNOSIS — Z86010 Personal history of colonic polyps: Secondary | ICD-10-CM | POA: Diagnosis not present

## 2016-10-20 DIAGNOSIS — K5730 Diverticulosis of large intestine without perforation or abscess without bleeding: Secondary | ICD-10-CM | POA: Diagnosis not present

## 2016-10-20 DIAGNOSIS — K648 Other hemorrhoids: Secondary | ICD-10-CM | POA: Diagnosis not present

## 2016-10-20 DIAGNOSIS — Z1211 Encounter for screening for malignant neoplasm of colon: Secondary | ICD-10-CM | POA: Diagnosis not present

## 2016-11-16 ENCOUNTER — Ambulatory Visit: Payer: Medicare HMO

## 2016-11-16 ENCOUNTER — Ambulatory Visit
Admission: RE | Admit: 2016-11-16 | Discharge: 2016-11-16 | Disposition: A | Discharge status: Home / Self Care | Payer: Medicare HMO | Source: Ambulatory Visit | Attending: Internal Medicine | Admitting: Internal Medicine

## 2016-11-16 ENCOUNTER — Other Ambulatory Visit: Payer: Self-pay | Admitting: Internal Medicine

## 2016-11-16 DIAGNOSIS — M8589 Other specified disorders of bone density and structure, multiple sites: Secondary | ICD-10-CM | POA: Diagnosis not present

## 2016-11-16 DIAGNOSIS — Z780 Asymptomatic menopausal state: Secondary | ICD-10-CM | POA: Diagnosis not present

## 2016-11-16 DIAGNOSIS — C50311 Malignant neoplasm of lower-inner quadrant of right female breast: Secondary | ICD-10-CM

## 2016-11-16 DIAGNOSIS — Z79811 Long term (current) use of aromatase inhibitors: Secondary | ICD-10-CM

## 2016-11-16 DIAGNOSIS — Z1231 Encounter for screening mammogram for malignant neoplasm of breast: Secondary | ICD-10-CM | POA: Diagnosis not present

## 2016-11-16 HISTORY — DX: Personal history of irradiation: Z92.3

## 2016-11-18 ENCOUNTER — Other Ambulatory Visit: Payer: Self-pay | Admitting: *Deleted

## 2016-11-18 ENCOUNTER — Telehealth: Payer: Self-pay | Admitting: *Deleted

## 2016-11-18 DIAGNOSIS — Z170 Estrogen receptor positive status [ER+]: Principal | ICD-10-CM

## 2016-11-18 DIAGNOSIS — R69 Illness, unspecified: Secondary | ICD-10-CM | POA: Diagnosis not present

## 2016-11-18 DIAGNOSIS — M8589 Other specified disorders of bone density and structure, multiple sites: Secondary | ICD-10-CM

## 2016-11-18 DIAGNOSIS — C50311 Malignant neoplasm of lower-inner quadrant of right female breast: Secondary | ICD-10-CM

## 2016-11-18 MED ORDER — ALENDRONATE SODIUM 70 MG PO TABS: 70.0000 mg | ORAL_TABLET | ORAL | 11 refills | Status: DC

## 2016-11-18 NOTE — Telephone Encounter (Signed)
RN spoke with patient - reviewed bone density results with patient. New rx sent for Fosamax to pt's pharmacy. RN Instructed patient with verbal instructions how to take Foasamax-empty stomach, full glass of water and not to lie down after after taking the tablet.   Pt informed to continue taking OTC calcium/vitamin D. Pt will ensure that these supplements are added. Patient stated that she she will get started with 2 lb weights and excerises.  Teach back process performed with patient.

## 2016-11-18 NOTE — Telephone Encounter (Signed)
-----   Message from Cammie Sickle, MD sent at 11/17/2016  7:23 AM EDT ----- Please inform patient- bone density shows osteopenia [low bone mass]; not osteoporosis. Recommend continued calcium and vitamin D; Fosamax; include exercise/weightbearing if possible. Otherwise no new recommendations. Follow-up as planned. Dr.B

## 2017-02-18 ENCOUNTER — Telehealth: Payer: Self-pay | Admitting: Family Medicine

## 2017-02-18 DIAGNOSIS — E7800 Pure hypercholesterolemia, unspecified: Secondary | ICD-10-CM

## 2017-02-18 DIAGNOSIS — E039 Hypothyroidism, unspecified: Secondary | ICD-10-CM

## 2017-02-18 DIAGNOSIS — E559 Vitamin D deficiency, unspecified: Secondary | ICD-10-CM

## 2017-02-18 DIAGNOSIS — I10 Essential (primary) hypertension: Secondary | ICD-10-CM

## 2017-02-18 DIAGNOSIS — R7309 Other abnormal glucose: Secondary | ICD-10-CM

## 2017-02-18 NOTE — Telephone Encounter (Signed)
-----   Message from Ellamae Sia sent at 02/17/2017 11:14 AM EST ----- Regarding: Lab orders for Wednesday, 1.2.19 Patient is scheduled for CPX labs, please order future labs, Thanks , Karna Christmas

## 2017-02-22 ENCOUNTER — Ambulatory Visit: Payer: Medicare HMO

## 2017-02-24 ENCOUNTER — Other Ambulatory Visit (INDEPENDENT_AMBULATORY_CARE_PROVIDER_SITE_OTHER): Payer: Medicare HMO

## 2017-02-24 DIAGNOSIS — E039 Hypothyroidism, unspecified: Secondary | ICD-10-CM

## 2017-02-24 DIAGNOSIS — E7800 Pure hypercholesterolemia, unspecified: Secondary | ICD-10-CM

## 2017-02-24 DIAGNOSIS — R7309 Other abnormal glucose: Secondary | ICD-10-CM | POA: Diagnosis not present

## 2017-02-24 DIAGNOSIS — I10 Essential (primary) hypertension: Secondary | ICD-10-CM | POA: Diagnosis not present

## 2017-02-24 DIAGNOSIS — E559 Vitamin D deficiency, unspecified: Secondary | ICD-10-CM

## 2017-02-24 LAB — CBC WITH DIFFERENTIAL/PLATELET
BASOS ABS: 0.1 10*3/uL (ref 0.0–0.1)
Basophils Relative: 1.2 % (ref 0.0–3.0)
EOS ABS: 0.1 10*3/uL (ref 0.0–0.7)
Eosinophils Relative: 2.1 % (ref 0.0–5.0)
HCT: 42.3 % (ref 36.0–46.0)
Hemoglobin: 13.9 g/dL (ref 12.0–15.0)
LYMPHS ABS: 1.5 10*3/uL (ref 0.7–4.0)
Lymphocytes Relative: 23.8 % (ref 12.0–46.0)
MCHC: 32.8 g/dL (ref 30.0–36.0)
MCV: 92.8 fl (ref 78.0–100.0)
MONO ABS: 0.7 10*3/uL (ref 0.1–1.0)
Monocytes Relative: 10.3 % (ref 3.0–12.0)
NEUTROS PCT: 62.6 % (ref 43.0–77.0)
Neutro Abs: 4 10*3/uL (ref 1.4–7.7)
PLATELETS: 275 10*3/uL (ref 150.0–400.0)
RBC: 4.56 Mil/uL (ref 3.87–5.11)
RDW: 13.5 % (ref 11.5–15.5)
WBC: 6.4 10*3/uL (ref 4.0–10.5)

## 2017-02-24 LAB — COMPREHENSIVE METABOLIC PANEL
ALT: 20 U/L (ref 0–35)
AST: 28 U/L (ref 0–37)
Albumin: 4.7 g/dL (ref 3.5–5.2)
Alkaline Phosphatase: 58 U/L (ref 39–117)
BILIRUBIN TOTAL: 0.7 mg/dL (ref 0.2–1.2)
BUN: 14 mg/dL (ref 6–23)
CALCIUM: 9.5 mg/dL (ref 8.4–10.5)
CO2: 27 meq/L (ref 19–32)
CREATININE: 0.94 mg/dL (ref 0.40–1.20)
Chloride: 106 mEq/L (ref 96–112)
GFR: 61.75 mL/min (ref >60.00)
GLUCOSE: 98 mg/dL (ref 70–99)
Potassium: 4 mEq/L (ref 3.5–5.1)
Sodium: 144 mEq/L (ref 135–145)
Total Protein: 7.5 g/dL (ref 6.0–8.3)

## 2017-02-24 LAB — LIPID PANEL
CHOL/HDL RATIO: 3
Cholesterol: 162 mg/dL (ref 0–200)
HDL: 62.4 mg/dL (ref >39.00)
LDL Cholesterol: 78 mg/dL (ref 0–99)
NONHDL: 100.06
TRIGLYCERIDES: 111 mg/dL (ref 0.0–149.0)
VLDL: 22.2 mg/dL (ref 0.0–40.0)

## 2017-02-24 LAB — HEMOGLOBIN A1C: Hgb A1c MFr Bld: 5.6 % (ref 4.6–6.5)

## 2017-02-24 LAB — TSH: TSH: 0.17 u[IU]/mL — AB (ref 0.35–4.50)

## 2017-02-24 LAB — VITAMIN D 25 HYDROXY (VIT D DEFICIENCY, FRACTURES): VITD: 32.59 ng/mL (ref 30.00–100.00)

## 2017-02-25 ENCOUNTER — Ambulatory Visit: Payer: Medicare HMO

## 2017-03-03 ENCOUNTER — Ambulatory Visit (INDEPENDENT_AMBULATORY_CARE_PROVIDER_SITE_OTHER): Payer: Medicare HMO | Admitting: Family Medicine

## 2017-03-03 ENCOUNTER — Ambulatory Visit (INDEPENDENT_AMBULATORY_CARE_PROVIDER_SITE_OTHER): Payer: Medicare HMO

## 2017-03-03 ENCOUNTER — Encounter: Payer: Self-pay | Admitting: Family Medicine

## 2017-03-03 VITALS — BP 136/64 | HR 69 | Temp 97.9°F | Ht 60.0 in | Wt 128.8 lb

## 2017-03-03 DIAGNOSIS — M8589 Other specified disorders of bone density and structure, multiple sites: Secondary | ICD-10-CM

## 2017-03-03 DIAGNOSIS — E559 Vitamin D deficiency, unspecified: Secondary | ICD-10-CM | POA: Diagnosis not present

## 2017-03-03 DIAGNOSIS — R7309 Other abnormal glucose: Secondary | ICD-10-CM | POA: Diagnosis not present

## 2017-03-03 DIAGNOSIS — I10 Essential (primary) hypertension: Secondary | ICD-10-CM

## 2017-03-03 DIAGNOSIS — C50311 Malignant neoplasm of lower-inner quadrant of right female breast: Secondary | ICD-10-CM

## 2017-03-03 DIAGNOSIS — Z0000 Encounter for general adult medical examination without abnormal findings: Secondary | ICD-10-CM | POA: Diagnosis not present

## 2017-03-03 DIAGNOSIS — E039 Hypothyroidism, unspecified: Secondary | ICD-10-CM

## 2017-03-03 DIAGNOSIS — E7800 Pure hypercholesterolemia, unspecified: Secondary | ICD-10-CM

## 2017-03-03 DIAGNOSIS — Z170 Estrogen receptor positive status [ER+]: Secondary | ICD-10-CM | POA: Diagnosis not present

## 2017-03-03 MED ORDER — ROSUVASTATIN CALCIUM 10 MG PO TABS: 10.0000 mg | ORAL_TABLET | Freq: Every day | ORAL | 11 refills | Status: DC

## 2017-03-03 MED ORDER — AMLODIPINE BESYLATE 5 MG PO TABS: 5.0000 mg | ORAL_TABLET | Freq: Every day | ORAL | 11 refills | Status: DC

## 2017-03-03 MED ORDER — LEVOTHYROXINE SODIUM 50 MCG PO TABS
50.0000 ug | ORAL_TABLET | Freq: Every day | ORAL | 11 refills | Status: DC
Start: 2017-03-03 — End: 2018-03-17

## 2017-03-03 NOTE — Patient Instructions (Addendum)
Increase daily vit D to 2000 iu   If you are interested in the Shingrix vaccine (new) check in with your pharmacy   Your TSH is down so we need to decrease your levothyroxine to 50 mcg  Re check lab in 6 weeks   Take care of yourself  Glad you are doing well

## 2017-03-03 NOTE — Assessment & Plan Note (Signed)
Lab Results  Component Value Date   HGBA1C 5.6 02/24/2017   Doing well disc imp of low glycemic diet and wt loss to prevent DM2

## 2017-03-03 NOTE — Assessment & Plan Note (Signed)
Disc goals for lipids and reasons to control them Rev labs with pt Rev low sat fat diet in detail Well controlled with diet and crestor

## 2017-03-03 NOTE — Assessment & Plan Note (Signed)
Reviewed health habits including diet and exercise and skin cancer prevention Reviewed appropriate screening tests for age  Also reviewed health mt list, fam hx and immunization status , as well as social and family history   See HPI For amw today  Labs reviewed  Enc exercise program  Looking into shingrix vaccine

## 2017-03-03 NOTE — Progress Notes (Signed)
Subjective:    Patient ID: Madison Huerta, female    DOB: [birth year], 77 y.o.   MRN: [id number]  HPI Here for health maintenance exam and to review chronic medical problems    A lot of moves in 2018  Husband had a knee repl -doing better  Looking forward to spring  Feeling good   Wt Readings from Last 3 Encounters:  03/03/17 128 lb 12 oz (58.4 kg)  07/13/16 128 lb (58.1 kg)  05/18/16 123 lb 12.8 oz (56.2 kg)  mt her weight , she had xmas eating /now back to normal eating  Exercise - does at home and some walking twice daily  25.14 kg/m   Pap 1/17 neg  Arimidex for breast ca hx  No vag bleeding or spotting   Mammogram 9/18 neg Self breast exam  Past hx of breast cancer with R mastectomy Arimidex  Colonoscopy 8/18 neg Dr Allyn Kenner  The prep made her sick 5 y recall -unsure if she will do it   dexa 9/18-osteopenia  On fosamax from her oncol now  She takes her D and ca  D level is 32.5  (1000 iu daily)   zostavax 12/09 Interested in shingrix   bp is stable today  No cp or palpitations or headaches or edema  No side effects to medicines  BP Readings from Last 3 Encounters:  03/03/17 136/64  07/13/16 (!) 163/76  05/18/16 (!) 163/81     Hypothyroidism  Pt has no clinical changes No change in energy level/ hair or skin/ edema and no tremor Lab Results  Component Value Date   TSH 0.17 (L) 02/24/2017   on 75 - will need to go down to 50 mcg     Hyperlipidemia Lab Results  Component Value Date   CHOL 162 02/24/2017   CHOL 192 02/21/2016   CHOL 193 02/19/2015   Lab Results  Component Value Date   HDL 62.40 02/24/2017   HDL 77.50 02/21/2016   HDL 78.10 02/19/2015   Lab Results  Component Value Date   LDLCALC 78 02/24/2017   LDLCALC 90 02/21/2016   LDLCALC 94 02/19/2015   Lab Results  Component Value Date   TRIG 111.0 02/24/2017   TRIG 125.0 02/21/2016   TRIG 106.0 02/19/2015   Lab Results  Component Value Date   CHOLHDL 3 02/24/2017   CHOLHDL  2 02/21/2016   CHOLHDL 2 02/19/2015   Lab Results  Component Value Date   LDLDIRECT 107.1 02/06/2010   crestor and diet  Well controlled  Diet is good also   Other labs; Results for orders placed or performed in visit on 02/24/17  VITAMIN D 25 Hydroxy (Vit-D Deficiency, Fractures)  Result Value Ref Range   VITD 32.59 30.00 - 100.00 ng/mL  TSH  Result Value Ref Range   TSH 0.17 (L) 0.35 - 4.50 uIU/mL  Hemoglobin A1c  Result Value Ref Range   Hgb A1c MFr Bld 5.6 4.6 - 6.5 %  Lipid panel  Result Value Ref Range   Cholesterol 162 0 - 200 mg/dL   Triglycerides 111.0 0.0 - 149.0 mg/dL   HDL 62.40 >39.00 mg/dL   VLDL 22.2 0.0 - 40.0 mg/dL   LDL Cholesterol 78 0 - 99 mg/dL   Total CHOL/HDL Ratio 3    NonHDL 100.06   Comprehensive metabolic panel  Result Value Ref Range   Sodium 144 135 - 145 mEq/L   Potassium 4.0 3.5 - 5.1 mEq/L   Chloride 106 96 -  112 mEq/L   CO2 27 19 - 32 mEq/L   Glucose, Bld 98 70 - 99 mg/dL   BUN 14 6 - 23 mg/dL   Creatinine, Ser 0.94 0.40 - 1.20 mg/dL   Total Bilirubin 0.7 0.2 - 1.2 mg/dL   Alkaline Phosphatase 58 39 - 117 U/L   AST 28 0 - 37 U/L   ALT 20 0 - 35 U/L   Total Protein 7.5 6.0 - 8.3 g/dL   Albumin 4.7 3.5 - 5.2 g/dL   Calcium 9.5 8.4 - 10.5 mg/dL   GFR 61.75 >60.00 mL/min  CBC with Differential/Platelet  Result Value Ref Range   WBC 6.4 4.0 - 10.5 K/uL   RBC 4.56 3.87 - 5.11 Mil/uL   Hemoglobin 13.9 12.0 - 15.0 g/dL   HCT 42.3 36.0 - 46.0 %   MCV 92.8 78.0 - 100.0 fl   MCHC 32.8 30.0 - 36.0 g/dL   RDW 13.5 11.5 - 15.5 %   Platelets 275.0 150.0 - 400.0 K/uL   Neutrophils Relative % 62.6 43.0 - 77.0 %   Lymphocytes Relative 23.8 12.0 - 46.0 %   Monocytes Relative 10.3 3.0 - 12.0 %   Eosinophils Relative 2.1 0.0 - 5.0 %   Basophils Relative 1.2 0.0 - 3.0 %   Neutro Abs 4.0 1.4 - 7.7 K/uL   Lymphs Abs 1.5 0.7 - 4.0 K/uL   Monocytes Absolute 0.7 0.1 - 1.0 K/uL   Eosinophils Absolute 0.1 0.0 - 0.7 K/uL   Basophils Absolute 0.1  0.0 - 0.1 K/uL     For amw visit after this   Patient Active Problem List   Diagnosis Date Noted  . Elevated glucose level 02/28/2016  . 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 Routine general medical examination at a health care facility 02/26/2015  . Encounter for routine gynecological examination 02/26/2015  . 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 VSD (ventricular septal defect)   . 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 Encounter for Medicare annual wellness exam 02/20/2013  . Routine gynecological examination 02/19/2012  . PND (post-nasal drip) 12/25/2010  . Vitamin D deficiency 04/29/2009  . Hypothyroidism 02/06/2008  . Hyperlipidemia 01/26/2007  . Essential hypertension 01/26/2007  . Osteopenia 01/26/2007  . BREAST CANCER, HX OF 01/26/2007  . VENTRICULAR SEPTAL DEFECT, HX OF 01/26/2007   Past Medical History:  Diagnosis Date  . Breast cancer ([place]) 1994   L breast- lumpectomy, care for at [facility]   . Breast cancer [facility]) 2013   right breast- mastectomy, care at [facility]  . Cataract   . Endocarditis   . GERD (gastroesophageal reflux disease)   . Heart murmur    VSD- echocardiogram - [facility] 10 yrs. ago  . History of breast cancer   . Hyperlipidemia   . Hypertension   . Hypothyroidism   . Osteopenia   . Overweight(278.02)   . Personal history of radiation therapy 1994   left breast ca  . Thyroid disease    Hypothyroidism  . VSD (ventricular septal defect)    does need SBE prophylaxis (Keflex  3 gm 1 hour pprior and 1.5 gm 6 hours post  . VSD (ventricular septal defect)    Past Surgical History:  Procedure Laterality Date  . 2D echo  2004   Normal at [facility]  . AUGMENTATION MAMMAPLASTY Bilateral    removed 1994  . BREAST BIOPSY Left 1994   breast ca  . BREAST BIOPSY Right 2013  breast ca  . BREAST IMPLANT REMOVAL  1994  . BREAST LUMPECTOMY Left 1994   lumpectomy and rad tx and tamoxifen  . MASTECTOMY  Right 01/08/2012   complete and anastrozole  . MASTECTOMY W/ SENTINEL NODE BIOPSY  01/08/2012   Procedure: MASTECTOMY WITH SENTINEL LYMPH NODE BIOPSY;  Surgeon: Madilyn Hook, DO;  Location: [facility];  Service: General;  Laterality: Right;  right breast mastectomy with sentinel lymph node biopsy   . PARATHYROIDECTOMY  12/2002  . TONSILLECTOMY     As a child  . TUBAL LIGATION     1976   Social History   Tobacco Use  . Smoking status: Never Smoker  . Smokeless tobacco: Never Used  Substance Use Topics  . Alcohol use: Yes    Alcohol/week: 0.0 oz    Comment: 1 glass of wine occ  . Drug use: No   Family History  Problem Relation Age of Onset  . Aneurysm Mother        brain  . Cancer Father        Lung, Liver mets  . Lung cancer Father 63  . Heart disease Other   . Cancer Cousin        Breast CA  . Breast cancer Cousin 30  . Breast cancer Maternal Aunt 55   Allergies  Allergen Reactions  . Codeine Other (See Comments)    Reaction:  Severe headaches   . Penicillins Hives and Other (See Comments)    Has patient had a PCN reaction causing immediate rash, facial/tongue/throat swelling, SOB or lightheadedness with hypotension: No Has patient had a PCN reaction causing severe rash involving mucus membranes or skin necrosis: No Has patient had a PCN reaction that required hospitalization No Has patient had a PCN reaction occurring within the last 10 years: No If all of the above answers are "NO", then may proceed with Cephalosporin use.  . Adhesive [Tape] Rash   Current Outpatient Medications on File Prior to Visit  Medication Sig Dispense Refill  . acetaminophen (TYLENOL) 500 MG tablet Take 500 mg by mouth every 4 (four) hours as needed for fever.    Marland Kitchen alendronate (FOSAMAX) 70 MG tablet Take 1 tablet (70 mg total) by mouth once a week. Take with a full glass of water on an empty stomach. 4 tablet 11  . anastrozole (ARIMIDEX) 1 MG tablet Take 1 tablet (1 mg total) by mouth daily. 90  tablet 3  . cholecalciferol (VITAMIN D) 1000 units tablet Take 1,000 Units by mouth daily.     No current facility-administered medications on file prior to visit.     Review of Systems  Constitutional: Positive for fatigue. Negative for activity change, appetite change, fever and unexpected weight change.  HENT: Negative for congestion, ear pain, rhinorrhea, sinus pressure and sore throat.   Eyes: Negative for pain, redness and visual disturbance.  Respiratory: Negative for cough, shortness of breath and wheezing.   Cardiovascular: Negative for chest pain and palpitations.  Gastrointestinal: Negative for abdominal pain, blood in stool, constipation and diarrhea.  Endocrine: Negative for polydipsia and polyuria.  Genitourinary: Negative for dysuria, frequency and urgency.  Musculoskeletal: Negative for arthralgias, back pain and myalgias.  Skin: Negative for pallor and rash.  Allergic/Immunologic: Negative for environmental allergies.  Neurological: Negative for dizziness, syncope and headaches.  Hematological: Negative for adenopathy. Does not bruise/bleed easily.  Psychiatric/Behavioral: Negative for decreased concentration and dysphoric mood. The patient is not nervous/anxious.        Objective:  Physical Exam  Constitutional: She appears well-developed and well-nourished. No distress.  HENT:  Head: Normocephalic and atraumatic.  Right Ear: External ear normal.  Left Ear: External ear normal.  Mouth/Throat: Oropharynx is clear and moist.  Eyes: Conjunctivae and EOM are normal. Pupils are equal, round, and reactive to light. No scleral icterus.  Neck: Normal range of motion. Neck supple. No JVD present. Carotid bruit is not present. No thyromegaly present.  Cardiovascular: Normal rate, regular rhythm and intact distal pulses. Exam reveals no gallop.  Murmur heard. Pulmonary/Chest: Effort normal and breath sounds normal. No respiratory distress. She has no wheezes. She exhibits  no tenderness.  Abdominal: Soft. Bowel sounds are normal. She exhibits no distension, no abdominal bruit and no mass. There is no tenderness.  Genitourinary: No breast swelling, tenderness, discharge or bleeding.  Genitourinary Comments: S/p R mastectomy-no masses or tenderness in axilla  L breast: Breast exam: No mass, nodules, thickening, tenderness, bulging, retraction, inflamation, nipple discharge or skin changes noted.  No axillary or clavicular LA.    (scars from prev reduction noted)   Musculoskeletal: Normal range of motion. She exhibits no edema or tenderness.  No kyphosis   Lymphadenopathy:    She has no cervical adenopathy.  Neurological: She is alert. She has normal reflexes. No cranial nerve deficit. She exhibits normal muscle tone. Coordination normal.  Skin: Skin is warm and dry. No rash noted. No erythema. No pallor.  Solar lentigines diffusely   Psychiatric: She has a normal mood and affect.          Assessment & Plan:   Problem List Items Addressed This Visit      Cardiovascular and Mediastinum   Essential hypertension    bp in fair control at this time  BP Readings from Last 1 Encounters:  03/03/17 136/64   No changes needed Disc lifstyle change with low sodium diet and exercise  Labs rev  Refill amlodipine       Relevant Medications   amLODipine (NORVASC) 5 MG tablet   rosuvastatin (CRESTOR) 10 MG tablet     Endocrine   Hypothyroidism    Hypothyroidism  Pt has no clinical changes No change in energy level/ hair or skin/ edema and no tremor Lab Results  Component Value Date   TSH 0.17 (L) 02/24/2017    Will dec dose of levothy to 50 mcg r echeck in 6 wk       Relevant Medications   levothyroxine (SYNTHROID, LEVOTHROID) 50 MCG tablet     Musculoskeletal and Integument   Osteopenia    On arimidex-inc risk of OP On ca and D Inc D to 2000 iu daily  Exercise No falls or fx  Rev recent dexa         Other   Carcinoma of lower-inner  quadrant of right breast in female, estrogen receptor positive ([place])    Doing well with regular f/u  On Arimidex Disc risk of endom hyperplasia and lowering bone density with this       Elevated glucose level    Lab Results  Component Value Date   HGBA1C 5.6 02/24/2017   Doing well disc imp of low glycemic diet and wt loss to prevent DM2       Hyperlipidemia    Disc goals for lipids and reasons to control them Rev labs with pt Rev low sat fat diet in detail Well controlled with diet and crestor       Relevant Medications   amLODipine (NORVASC) 5  MG tablet   rosuvastatin (CRESTOR) 10 MG tablet   Routine general medical examination at a health care facility - Primary    Reviewed health habits including diet and exercise and skin cancer prevention Reviewed appropriate screening tests for age  Also reviewed health mt list, fam hx and immunization status , as well as social and family history   See HPI For amw today  Labs reviewed  Enc exercise program  Looking into shingrix vaccine       Vitamin D deficiency    Level in 49s  Inc to 2000 iu daily  Disc imp to bone and overall health

## 2017-03-03 NOTE — Assessment & Plan Note (Signed)
On arimidex-inc risk of OP On ca and D Inc D to 2000 iu daily  Exercise No falls or fx  Rev recent dexa

## 2017-03-03 NOTE — Progress Notes (Signed)
Subjective:   Madison Huerta is a 77 y.o. female who presents for Medicare Annual (Subsequent) preventive examination.  Review of Systems:  N/A Cardiac Risk Factors include: advanced age (>59men, >79 women);dyslipidemia;hypertension     Objective:     Vitals: BP 136/64 (BP Location: Right Arm, Patient Position: Sitting, Cuff Size: Normal)   Pulse 69   Temp 97.9 F (36.6 C) (Oral)   Ht 5' (1.524 m)   Wt 128 lb 12 oz (58.4 kg)   SpO2 97%   BMI 25.14 kg/m   Body mass index is 25.14 kg/m.  Advanced Directives 03/03/2017 07/13/2016 05/18/2016 02/21/2016 11/18/2015 05/01/2015 11/26/2014  Does Patient Have a Medical Advance Directive? Yes Yes Yes Yes Yes No No  Type of Paramedic of [place];Living will [place];Living will [place];Living will [place];Living will Home;Living will - -  Does patient want to make changes to medical advance directive? - - No - Patient declined - No - Patient declined - -  Copy of [place] in Chart? No - copy requested No - copy requested Yes No - copy requested No - copy requested - -  Would patient like information on creating a medical advance directive? - - - - - No - patient declined information -    Tobacco Social History   Tobacco Use  Smoking Status Never Smoker  Smokeless Tobacco Never Used     Counseling given: No   Clinical Intake:  Pre-visit preparation completed: Yes  Pain : No/denies pain Pain Score: 0-No pain     Nutritional Status: BMI 25 -29 Overweight Nutritional Risks: None Diabetes: No  How often do you need to have someone help you when you read instructions, pamphlets, or other written materials from your doctor or pharmacy?: 1 - Never What is the last grade level you completed in school?: 12th grade  Interpreter Needed?: No  Comments: pt lives with spouse Information entered by :: LPinson,  LPN  Past Medical History:  Diagnosis Date  . Breast cancer ([place]) 1994   L breast- lumpectomy, [facility] at [facility]   . Breast cancer [facility]) 2013   right breast- mastectomy, care at [facility]  . Cataract   . Endocarditis   . GERD (gastroesophageal reflux disease)   . Heart murmur    VSD- echocardiogram - [facility] 10 yrs. ago  . History of breast cancer   . Hyperlipidemia   . Hypertension   . Hypothyroidism   . Osteopenia   . Overweight(278.02)   . Personal history of radiation therapy 1994   left breast ca  . Thyroid disease    Hypothyroidism  . VSD (ventricular septal defect)    does need SBE prophylaxis (Keflex  3 gm 1 hour pprior and 1.5 gm 6 hours post  . VSD (ventricular septal defect)    Past Surgical History:  Procedure Laterality Date  . 2D echo  2004   Normal at [facility]  . AUGMENTATION MAMMAPLASTY Bilateral    removed 1994  . BREAST BIOPSY Left 1994   breast ca  . BREAST BIOPSY Right 2013   breast ca  . BREAST IMPLANT REMOVAL  1994  . BREAST LUMPECTOMY Left 1994   lumpectomy and rad tx and tamoxifen  . MASTECTOMY Right 01/08/2012   complete and anastrozole  . MASTECTOMY W/ SENTINEL NODE BIOPSY  01/08/2012   Procedure: MASTECTOMY WITH SENTINEL LYMPH NODE BIOPSY;  Surgeon: Madilyn Hook, DO;  Location: [facility];  Service: General;  Laterality: Right;  right breast mastectomy with sentinel lymph node biopsy   . PARATHYROIDECTOMY  12/2002  . TONSILLECTOMY     As a child  . TUBAL LIGATION     1976   Family History  Problem Relation Age of Onset  . Aneurysm Mother        brain  . Cancer Father        Lung, Liver mets  . Lung cancer Father 42  . Heart disease Other   . Cancer Cousin        Breast CA  . Breast cancer Cousin 30  . Breast cancer Maternal Aunt 58   Social History   Socioeconomic History  . Marital status: Married    Spouse name: None  . Number of children: 2  . Years of education: None  . Highest education level: None  Social Needs  . Financial  resource strain: None  . Food insecurity - worry: None  . Food insecurity - inability: None  . Transportation needs - medical: None  . Transportation needs - non-medical: None  Occupational History  . Occupation: Animator: UNEMPLOYED  Tobacco Use  . Smoking status: Never Smoker  . Smokeless tobacco: Never Used  Substance and Sexual Activity  . Alcohol use: Yes    Alcohol/week: 0.0 oz    Comment: 1 glass of wine occ  . Drug use: No  . Sexual activity: No  Other Topics Concern  . None  Social History Narrative  . None    Outpatient Encounter Medications as of 03/03/2017  Medication Sig  . acetaminophen (TYLENOL) 500 MG tablet Take 500 mg by mouth every 4 (four) hours as needed for fever.  Marland Kitchen alendronate (FOSAMAX) 70 MG tablet Take 1 tablet (70 mg total) by mouth once a week. Take with a full glass of water on an empty stomach.  Marland Kitchen amLODipine (NORVASC) 5 MG tablet Take 1 tablet (5 mg total) by mouth daily.  Marland Kitchen anastrozole (ARIMIDEX) 1 MG tablet Take 1 tablet (1 mg total) by mouth daily.  . cholecalciferol (VITAMIN D) 1000 units tablet Take 1,000 Units by mouth daily.  Marland Kitchen levothyroxine (SYNTHROID, LEVOTHROID) 50 MCG tablet Take 1 tablet (50 mcg total) by mouth daily.  . rosuvastatin (CRESTOR) 10 MG tablet Take 1 tablet (10 mg total) by mouth daily.  . [DISCONTINUED] rosuvastatin (CRESTOR) 10 MG tablet Take 1 tablet (10 mg total) by mouth daily.   No facility-administered encounter medications on file as of 03/03/2017.     Activities of Daily Living In your present state of health, do you have any difficulty performing the following activities: 03/03/2017  Hearing? N  Vision? N  Difficulty concentrating or making decisions? N  Walking or climbing stairs? N  Dressing or bathing? N  Doing errands, shopping? N  Preparing Food and eating ? N  Using the Toilet? N  In the past six months, have you accidently leaked urine? N  Do you have problems with loss of bowel  control? N  Managing your Medications? N  Managing your Finances? N  Housekeeping or managing your Housekeeping? N  Some recent data might be hidden    Patient Care Team: Tower, Wynelle Fanny, MD as PCP - General Rogue Bussing, Elisha Headland, MD as Consulting Physician (Internal Medicine) Karsten Fells, DDS as Consulting Physician (Dentistry) Kathyrn Lass, OD as Consulting Physician (Optometry)    Assessment:   This is a routine wellness examination for  Luellen Pucker.   Hearing Screening   125Hz  250Hz  500Hz  1000Hz  2000Hz  3000Hz  4000Hz  6000Hz  8000Hz   Right ear:   40 40 40  40    Left ear:   40 40 40  40    Vision Screening Comments: Last vision exam with Dr. Atilano Median in May 2018  Exercise Activities and Dietary recommendations Current Exercise Habits: Home exercise routine, Type of exercise: walking, Time (Minutes): 30, Frequency (Times/Week): 7, Weekly Exercise (Minutes/Week): 210, Intensity: Moderate, Exercise limited by: None identified  Goals    . Increase physical activity     Starting 03/03/2017, I will continue to walk for 30 minutes daily.        Fall Risk Fall Risk  03/03/2017 02/21/2016 02/26/2015 02/21/2014 02/20/2013  Falls in the past year? No No No Yes No  Number falls in past yr: - - - 1 -  Injury with Fall? - - - No -    Depression Screen PHQ 2/9 Scores 03/03/2017 02/21/2016 02/26/2015 02/21/2014  PHQ - 2 Score 0 0 0 0  PHQ- 9 Score 0 - - -     Cognitive Function MMSE - Mini Mental State Exam 03/03/2017 02/21/2016  Orientation to time 5 5  Orientation to Place 5 5  Registration 3 3  Attention/ Calculation 0 0  Recall 3 3  Language- name 2 objects 0 0  Language- repeat 1 1  Language- follow 3 step command 3 3  Language- read & follow direction 0 0  Write a sentence 0 0  Copy design 0 0  Total score 20 20       PLEASE NOTE: A Mini-Cog screen was completed. Maximum score is 20. A value of 0 denotes this part of [place] MMSE was not completed or the patient failed this part of  the Mini-Cog screening.   Mini-Cog Screening Orientation to Time - Max 5 pts Orientation to Place - Max 5 pts Registration - Max 3 pts Recall - Max 3 pts Language Repeat - Max 1 pts Language Follow 3 Step Command - Max 3 pts   Immunization History  Administered Date(s) Administered  . Influenza Split 11/20/2010  . Influenza Whole 12/15/2006, 01/23/2009, 12/13/2009, 11/16/2011  . Influenza, High Dose Seasonal PF 12/01/2014, 11/18/2016  . Influenza,inj,Quad PF,6+ Mos 10/21/2015  . Influenza-Unspecified 11/22/2012, 10/07/2013  . Pneumococcal Conjugate-13 02/21/2014  . Pneumococcal Polysaccharide-23 01/14/2004, 02/12/2010  . Td 11/28/2001  . Tdap 02/18/2011  . Zoster 02/06/2008   Screening Tests Health Maintenance  Topic Date Due  . PAP SMEAR  03/04/2027 (Originally 02/25/2017)  . MAMMOGRAM  11/16/2017  . TETANUS/TDAP  02/17/2021  . COLONOSCOPY  10/21/2026  . INFLUENZA VACCINE  Completed  . DEXA SCAN  Completed  . PNA vac Low Risk Adult  Completed      Plan:     I have personally reviewed, addressed, and noted the following in the patient's chart:  A. Medical and social history B. Use of alcohol, tobacco or illicit drugs  C. Current medications and supplements D. Functional ability and status E.  Nutritional status F.  Physical activity G. Advance directives H. List of other physicians I.  Hospitalizations, surgeries, and ER visits in previous 12 months J.  [place] to include hearing, vision, cognitive, depression L. Referrals and appointments - none  In addition, I have reviewed and discussed with patient certain preventive protocols, quality metrics, and best practice recommendations. A written personalized care plan for preventive services as well as general preventive health recommendations were provided  to patient.  See attached scanned questionnaire for additional information.   Signed,   Lindell Noe, MHA, BS, LPN Health Coach

## 2017-03-03 NOTE — Progress Notes (Signed)
PCP notes:   Health maintenance:  No gaps identified.  Abnormal screenings:   None  Patient concerns:   None  Nurse concerns:  None  Next PCP appt:   N/A; CPE prior to AWV  I reviewed health advisor's note, was available for consultation, and agree with documentation and plan. Loura Pardon MD

## 2017-03-03 NOTE — Assessment & Plan Note (Signed)
bp in fair control at this time  BP Readings from Last 1 Encounters:  03/03/17 136/64   No changes needed Disc lifstyle change with low sodium diet and exercise  Labs rev  Refill amlodipine

## 2017-03-03 NOTE — Progress Notes (Signed)
Pre visit review using our clinic review tool, if applicable. No additional management support is needed unless otherwise documented below in the visit note. 

## 2017-03-03 NOTE — Assessment & Plan Note (Signed)
Doing well with regular f/u  On Arimidex Disc risk of endom hyperplasia and lowering bone density with this

## 2017-03-03 NOTE — Patient Instructions (Signed)
Madison Huerta , Thank you for taking time to come for your Medicare Wellness Visit. I appreciate your ongoing commitment to your health goals. Please review the following plan we discussed and let me know if I can assist you in the future.   These are the goals we discussed: Goals    . Increase physical activity     Starting 03/03/2017, I will continue to walk for 30 minutes daily.        This is a list of the screening recommended for you and due dates:  Health Maintenance  Topic Date Due  . Pap Smear  03/04/2027*  . Mammogram  11/16/2017  . Tetanus Vaccine  02/17/2021  . Colon Cancer Screening  10/21/2026  . Flu Shot  Completed  . DEXA scan (bone density measurement)  Completed  . Pneumonia vaccines  Completed  *Topic was postponed. The date shown is not the original due date.   Preventive Care for Adults  A healthy lifestyle and preventive care can promote health and wellness. Preventive health guidelines for adults include the following key practices.  . A routine yearly physical is a good way to check with your health care provider about your health and preventive screening. It is a chance to share any concerns and updates on your health and to receive a thorough exam.  . Visit your dentist for a routine exam and preventive care every 6 months. Brush your teeth twice a day and floss once a day. Good oral hygiene prevents tooth decay and gum disease.  . The frequency of eye exams is based on your age, health, family medical history, use  of contact lenses, and other factors. Follow your health care provider's recommendations for frequency of eye exams.  . Eat a healthy diet. Foods like vegetables, fruits, whole grains, low-fat dairy products, and lean protein foods contain the nutrients you need without too many calories. Decrease your intake of foods high in solid fats, added sugars, and salt. Eat the right amount of calories for you. Get information about a proper diet from your  health care provider, if necessary.  . Regular physical exercise is one of the most important things you can do for your health. Most adults should get at least 150 minutes of moderate-intensity exercise (any activity that increases your heart rate and causes you to sweat) each week. In addition, most adults need muscle-strengthening exercises on 2 or more days a week.  Silver Sneakers may be a benefit available to you. To determine eligibility, you may visit the website: www.silversneakers.com or contact program at (804) 078-5656 Mon-Fri between 8AM-8PM.   . Maintain a healthy weight. The body mass index (BMI) is a screening tool to identify possible weight problems. It provides an estimate of body fat based on height and weight. Your health care provider can find your BMI and can help you achieve or maintain a healthy weight.   For adults 20 years and older: ? A BMI below 18.5 is considered underweight. ? A BMI of 18.5 to 24.9 is normal. ? A BMI of 25 to 29.9 is considered overweight. ? A BMI of 30 and above is considered obese.   . Maintain normal blood lipids and cholesterol levels by exercising and minimizing your intake of saturated fat. Eat a balanced diet with plenty of fruit and vegetables. Blood tests for lipids and cholesterol should begin at age 89 and be repeated every 5 years. If your lipid or cholesterol levels are high, you are over 50,  or you are at high risk for heart disease, you may need your cholesterol levels checked more frequently. Ongoing high lipid and cholesterol levels should be treated with medicines if diet and exercise are not working.  . If you smoke, find out from your health care provider how to quit. If you do not use tobacco, please do not start.  . If you choose to drink alcohol, please do not consume more than 2 drinks per day. One drink is considered to be 12 ounces (355 mL) of beer, 5 ounces (148 mL) of wine, or 1.5 ounces (44 mL) of liquor.  . If you are  17-69 years old, ask your health care provider if you should take aspirin to prevent strokes.  . Use sunscreen. Apply sunscreen liberally and repeatedly throughout the day. You should seek shade when your shadow is shorter than you. Protect yourself by wearing long sleeves, pants, a wide-brimmed hat, and sunglasses year round, whenever you are outdoors.  . Once a month, do a whole body skin exam, using a mirror to look at the skin on your back. Tell your health care provider of new moles, moles that have irregular borders, moles that are larger than a pencil eraser, or moles that have changed in shape or color.

## 2017-03-03 NOTE — Assessment & Plan Note (Signed)
Hypothyroidism  Pt has no clinical changes No change in energy level/ hair or skin/ edema and no tremor Lab Results  Component Value Date   TSH 0.17 (L) 02/24/2017    Will dec dose of levothy to 50 mcg r echeck in 6 wk

## 2017-03-03 NOTE — Assessment & Plan Note (Signed)
Level in 30s  Inc to 2000 iu daily  Disc imp to bone and overall health

## 2017-03-07 ENCOUNTER — Other Ambulatory Visit: Payer: Self-pay | Admitting: Family Medicine

## 2017-04-14 ENCOUNTER — Other Ambulatory Visit (INDEPENDENT_AMBULATORY_CARE_PROVIDER_SITE_OTHER): Payer: Medicare HMO

## 2017-04-14 DIAGNOSIS — E039 Hypothyroidism, unspecified: Secondary | ICD-10-CM | POA: Diagnosis not present

## 2017-04-14 LAB — TSH: TSH: 4.26 u[IU]/mL (ref 0.35–4.50)

## 2017-04-28 ENCOUNTER — Other Ambulatory Visit: Payer: Self-pay | Admitting: Family Medicine

## 2017-05-11 DIAGNOSIS — H2513 Age-related nuclear cataract, bilateral: Secondary | ICD-10-CM | POA: Diagnosis not present

## 2017-05-28 ENCOUNTER — Other Ambulatory Visit: Payer: Self-pay | Admitting: Internal Medicine

## 2017-05-28 DIAGNOSIS — C50311 Malignant neoplasm of lower-inner quadrant of right female breast: Secondary | ICD-10-CM

## 2017-06-30 DIAGNOSIS — K648 Other hemorrhoids: Secondary | ICD-10-CM | POA: Diagnosis not present

## 2017-07-14 ENCOUNTER — Inpatient Hospital Stay: Payer: Medicare HMO | Attending: Internal Medicine | Admitting: Internal Medicine

## 2017-07-14 ENCOUNTER — Encounter: Payer: Self-pay | Admitting: Internal Medicine

## 2017-07-14 VITALS — BP 156/74 | HR 74 | Temp 97.4°F | Resp 16 | Wt 131.2 lb

## 2017-07-14 DIAGNOSIS — Z79811 Long term (current) use of aromatase inhibitors: Secondary | ICD-10-CM | POA: Insufficient documentation

## 2017-07-14 DIAGNOSIS — Z170 Estrogen receptor positive status [ER+]: Secondary | ICD-10-CM | POA: Diagnosis not present

## 2017-07-14 DIAGNOSIS — I10 Essential (primary) hypertension: Secondary | ICD-10-CM | POA: Diagnosis not present

## 2017-07-14 DIAGNOSIS — Z9011 Acquired absence of right breast and nipple: Secondary | ICD-10-CM | POA: Insufficient documentation

## 2017-07-14 DIAGNOSIS — K219 Gastro-esophageal reflux disease without esophagitis: Secondary | ICD-10-CM | POA: Diagnosis not present

## 2017-07-14 DIAGNOSIS — Z853 Personal history of malignant neoplasm of breast: Secondary | ICD-10-CM

## 2017-07-14 DIAGNOSIS — Z79899 Other long term (current) drug therapy: Secondary | ICD-10-CM | POA: Diagnosis not present

## 2017-07-14 DIAGNOSIS — Z801 Family history of malignant neoplasm of trachea, bronchus and lung: Secondary | ICD-10-CM | POA: Insufficient documentation

## 2017-07-14 DIAGNOSIS — Z9223 Personal history of estrogen therapy: Secondary | ICD-10-CM

## 2017-07-14 DIAGNOSIS — K648 Other hemorrhoids: Secondary | ICD-10-CM | POA: Diagnosis not present

## 2017-07-14 DIAGNOSIS — Q210 Ventricular septal defect: Secondary | ICD-10-CM | POA: Diagnosis not present

## 2017-07-14 DIAGNOSIS — Z923 Personal history of irradiation: Secondary | ICD-10-CM | POA: Diagnosis not present

## 2017-07-14 DIAGNOSIS — Z808 Family history of malignant neoplasm of other organs or systems: Secondary | ICD-10-CM

## 2017-07-14 DIAGNOSIS — M8580 Other specified disorders of bone density and structure, unspecified site: Secondary | ICD-10-CM | POA: Diagnosis not present

## 2017-07-14 DIAGNOSIS — Z803 Family history of malignant neoplasm of breast: Secondary | ICD-10-CM | POA: Diagnosis not present

## 2017-07-14 DIAGNOSIS — E039 Hypothyroidism, unspecified: Secondary | ICD-10-CM

## 2017-07-14 DIAGNOSIS — C50311 Malignant neoplasm of lower-inner quadrant of right female breast: Secondary | ICD-10-CM | POA: Insufficient documentation

## 2017-07-14 DIAGNOSIS — E785 Hyperlipidemia, unspecified: Secondary | ICD-10-CM | POA: Diagnosis not present

## 2017-07-14 NOTE — Assessment & Plan Note (Addendum)
#  Right breast cancer [October 2013]; stage I status post mastectomy; ER PR positive HER-2 negative-extended adjuvant Arimidex.  #Stable; no evidence of any recurrence.  Continue adjuvant Arimidex.  #DEXA scan September 2018 osteopenia T score -1.8; continue Fosamax; calcium plus vitamin D; continue weights  # follow up in 12 months/ no labs- mamo

## 2017-07-14 NOTE — Progress Notes (Signed)
[place] OFFICE PROGRESS NOTE  Patient Care Team: Tower, Wynelle Fanny, MD as PCP - General Rogue Bussing, Elisha Headland, MD as Consulting Physician (Internal Medicine) Karsten Fells, DDS as Consulting Physician (Dentistry) Kathyrn Lass, OD as Consulting Physician (Optometry)  Cancer Staging Carcinoma of lower-inner quadrant of right breast in female, estrogen receptor positive ([place]) Staging form: Breast, AJCC 7th Edition - Clinical: No stage assigned - Unsigned    Oncology History   # 1994- LEFT BREAST CA s/p Lumpec; RT- Tam  # OCT 2013-  RIGHT BREAST CA pT1c [multifocal-1.2cm s/p Mastec] pN0 ER/PR-pos;Her 2Neu-NEG;Dec 2013- Arimidex; mammo-Sep 2017-N; BREAST CANCER INDEX- BENEFIT from extended AI.   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  female pleasant patient above history of stage I breast cancer is here for follow-up.  Patient denies any hot flashes.  Denies any reflux problems.  Appetite is good.  No weight loss no shortness of breath no cough.  Review of Systems  Constitutional: Negative for chills, diaphoresis, fever, malaise/fatigue and weight loss.  HENT: Negative for nosebleeds and sore throat.   Eyes: Negative for double vision.  Respiratory: Negative for cough, hemoptysis, sputum production, shortness of breath and wheezing.   Cardiovascular: Negative for chest pain, palpitations, orthopnea and leg swelling.  Gastrointestinal: Negative for abdominal pain, blood in stool, constipation, diarrhea, heartburn, melena, nausea and vomiting.  Genitourinary:  Negative for dysuria, frequency and urgency.  Musculoskeletal: Negative for back pain and joint pain.  Skin: Negative.  Negative for itching and rash.  Neurological: Negative for dizziness, tingling, focal weakness, weakness and headaches.  Endo/Heme/Allergies: Does not bruise/bleed easily.  Psychiatric/Behavioral: Negative for depression. The patient is not nervous/anxious and does not have insomnia.       PAST MEDICAL HISTORY :  Past Medical History:  Diagnosis Date  . Breast cancer ([place]) 1994   L breast- lumpectomy, care for at [facility]   . Breast cancer [facility]) 2013   right breast- mastectomy, care at [facility]  . Cataract   . Endocarditis   . GERD (gastroesophageal reflux disease)   . Heart murmur    VSD- echocardiogram - [facility] 10 yrs. ago  . History of breast cancer   . Hyperlipidemia   . Hypertension   . Hypothyroidism   . Osteopenia   . Overweight(278.02)   . Personal history of radiation therapy 1994   left breast ca  . Thyroid disease    Hypothyroidism  . VSD (ventricular septal defect)    does need SBE prophylaxis (Keflex  3 gm 1 hour pprior and 1.5 gm 6 hours post  . VSD (ventricular septal defect)     PAST SURGICAL HISTORY :   Past Surgical History:  Procedure Laterality Date  . 2D echo  2004   Normal at [facility]  . AUGMENTATION MAMMAPLASTY Bilateral    removed 1994  . BREAST BIOPSY Left 1994   breast ca  . BREAST BIOPSY Right 2013   breast ca  . BREAST IMPLANT REMOVAL  1994  . BREAST LUMPECTOMY Left 1994   lumpectomy and rad tx and tamoxifen  . MASTECTOMY Right  01/08/2012   complete and anastrozole  . MASTECTOMY W/ SENTINEL NODE BIOPSY  01/08/2012   Procedure: MASTECTOMY WITH SENTINEL LYMPH NODE BIOPSY;  Surgeon: Madilyn Hook, DO;  Location: [place];  Service: General;  Laterality: Right;  right breast mastectomy with sentinel lymph node biopsy   . PARATHYROIDECTOMY  12/2002  . TONSILLECTOMY     As a child  . TUBAL LIGATION     1976    FAMILY HISTORY :    Family History  Problem Relation Age of Onset  . Aneurysm Mother        brain  . Cancer Father        Lung, Liver mets  . Lung cancer Father 69  . Heart disease Other   . Cancer Cousin        Breast CA  . Breast cancer Cousin 30  . Breast cancer Maternal Aunt 49    SOCIAL HISTORY:   Social History   Tobacco Use  . Smoking status: Never Smoker  . Smokeless tobacco: Never Used  Substance Use Topics  . Alcohol use: Yes    Alcohol/week: 0.0 oz    Comment: 1 glass of wine occ  . Drug use: No    ALLERGIES:  is allergic to codeine; penicillins; and adhesive [tape].  MEDICATIONS:  Current Outpatient Medications  Medication Sig Dispense Refill  . acetaminophen (TYLENOL) 500 MG tablet Take 500 mg by mouth every 4 (four) hours as needed for fever.    Marland Kitchen alendronate (FOSAMAX) 70 MG tablet Take 1 tablet (70 mg total) by mouth once a week. Take with a full glass of water on an empty stomach. 4 tablet 11  . amLODipine (NORVASC) 5 MG tablet Take 1 tablet (5 mg total) by mouth daily. 30 tablet 11  . anastrozole (ARIMIDEX) 1 MG tablet TAKE 1 TABLET(1 MG) BY MOUTH DAILY 90 tablet 0  . cholecalciferol (VITAMIN D) 1000 units tablet Take 1,000 Units by mouth daily.    Marland Kitchen levothyroxine (SYNTHROID, LEVOTHROID) 50 MCG tablet Take 1 tablet (50 mcg total) by mouth daily. 30 tablet 11  . rosuvastatin (CRESTOR) 10 MG tablet Take 1 tablet (10 mg total) by mouth daily. 30 tablet 11   No current facility-administered medications for this visit.     PHYSICAL EXAMINATION: ECOG PERFORMANCE STATUS: 0 - Asymptomatic  BP (!) 156/74 (BP Location: Left Arm, Patient Position: Sitting)   Pulse 74   Temp (!) 97.4 F (36.3 C) (Tympanic)   Resp 16   Wt 131 lb 3.2 oz (59.5 kg)   BMI 25.62 kg/m   Filed Weights   07/14/17 1351  Weight: 131 lb 3.2 oz (59.5 kg)    GENERAL: Well-nourished well-developed; Alert, no distress and comfortable.  She is alone. EYES: no pallor or icterus OROPHARYNX: no thrush or  ulceration; NECK: supple; no lymph nodes felt. LYMPH:  no palpable lymphadenopathy in the axillary or inguinal regions LUNGS: Decreased breath sounds auscultation bilaterally. No wheeze or crackles HEART/CVS: regular rate & rhythm and no murmurs; No lower extremity edema ABDOMEN:abdomen soft, non-tender and normal bowel sounds. No hepatomegaly or splenomegaly.  Musculoskeletal:no cyanosis of digits and no clubbing  PSYCH: alert & oriented x 3 with fluent speech NEURO: no focal motor/sensory deficits SKIN:  no rashes or significant lesions    LABORATORY DATA:  I have reviewed the data as listed    Component Value Date/Time   NA 144 02/24/2017 0900   NA 144 05/05/2013 0807   K 4.0 02/24/2017 0900  K 3.8 05/05/2013 0807   CL 106 02/24/2017 0900   CL 105 05/06/2012 1433   CO2 27 02/24/2017 0900   CO2 25 05/05/2013 0807   GLUCOSE 98 02/24/2017 0900   GLUCOSE 97 05/05/2013 0807   GLUCOSE 93 05/06/2012 1433   BUN 14 02/24/2017 0900   BUN 15.6 05/05/2013 0807   CREATININE 0.94 02/24/2017 0900   CREATININE 1.06 11/13/2013 1247   CREATININE 1.0 05/05/2013 0807   CALCIUM 9.5 02/24/2017 0900   CALCIUM 9.7 05/05/2013 0807   PROT 7.5 02/24/2017 0900   PROT 8.0 11/13/2013 1247   PROT 7.8 05/05/2013 0807   ALBUMIN 4.7 02/24/2017 0900   ALBUMIN 4.6 11/13/2013 1247   ALBUMIN 4.5 05/05/2013 0807   AST 28 02/24/2017 0900   AST 32 11/13/2013 1247   AST 27 05/05/2013 0807   ALT 20 02/24/2017 0900   ALT 31 11/13/2013 1247   ALT 19 05/05/2013 0807   ALKPHOS 58 02/24/2017 0900   ALKPHOS 58 11/13/2013 1247   ALKPHOS 79 05/05/2013 0807   BILITOT 0.7 02/24/2017 0900   BILITOT 0.7 11/13/2013 1247   BILITOT 0.64 05/05/2013 0807   GFRNONAA >60 05/18/2016 1010   GFRNONAA 53 (L) 11/13/2013 1247   GFRAA >60 05/18/2016 1010   GFRAA >60 11/13/2013 1247    No results found for: SPEP, UPEP  Lab Results  Component Value Date   WBC 6.4 02/24/2017   NEUTROABS 4.0 02/24/2017   HGB 13.9  02/24/2017   HCT 42.3 02/24/2017   MCV 92.8 02/24/2017   PLT 275.0 02/24/2017      Chemistry      Component Value Date/Time   NA 144 02/24/2017 0900   NA 144 05/05/2013 0807   K 4.0 02/24/2017 0900   K 3.8 05/05/2013 0807   CL 106 02/24/2017 0900   CL 105 05/06/2012 1433   CO2 27 02/24/2017 0900   CO2 25 05/05/2013 0807   BUN 14 02/24/2017 0900   BUN 15.6 05/05/2013 0807   CREATININE 0.94 02/24/2017 0900   CREATININE 1.06 11/13/2013 1247   CREATININE 1.0 05/05/2013 0807      Component Value Date/Time   CALCIUM 9.5 02/24/2017 0900   CALCIUM 9.7 05/05/2013 0807   ALKPHOS 58 02/24/2017 0900   ALKPHOS 58 11/13/2013 1247   ALKPHOS 79 05/05/2013 0807   AST 28 02/24/2017 0900   AST 32 11/13/2013 1247   AST 27 05/05/2013 0807   ALT 20 02/24/2017 0900   ALT 31 11/13/2013 1247   ALT 19 05/05/2013 0807   BILITOT 0.7 02/24/2017 0900   BILITOT 0.7 11/13/2013 1247   BILITOT 0.64 05/05/2013 0807       RADIOGRAPHIC STUDIES: I have personally reviewed the radiological images as listed and agreed with the findings in the report. No results found.   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 Currently on adjuvant Arimidex.   # Clinically no evidence of recurrence noted. Reviewed the Breast cancer index-risk of late of distant recurrence 7.8%; would benefit from an extended antihormone therapy. Discussed the patient she is in agreement.  #  DEXA scan 2018- Osteopenia- continue foasoax; ca=vit D; pt doe weights.   # follow up in 12 months/ no labs- mamo   Orders Placed This Encounter  Procedures  . MM 3D  SCREEN BREAST UNI LEFT    Standing Status:   Future    Standing Expiration Date:   09/14/2018    Order Specific Question:   Reason for  Exam (SYMPTOM  OR DIAGNOSIS REQUIRED)    Answer:   breast cancer    Order Specific Question:   Preferred imaging location?    Answer:   [facility]   All questions were answered. The patient knows to call the clinic with any problems, questions or concerns.      Cammie Sickle, MD 07/14/2017 2:17 PM

## 2017-07-28 DIAGNOSIS — K648 Other hemorrhoids: Secondary | ICD-10-CM | POA: Diagnosis not present

## 2017-08-26 ENCOUNTER — Other Ambulatory Visit: Payer: Self-pay | Admitting: Internal Medicine

## 2017-08-26 DIAGNOSIS — C50311 Malignant neoplasm of lower-inner quadrant of right female breast: Secondary | ICD-10-CM

## 2017-10-09 ENCOUNTER — Other Ambulatory Visit: Payer: Self-pay | Admitting: Internal Medicine

## 2017-10-09 DIAGNOSIS — Z170 Estrogen receptor positive status [ER+]: Principal | ICD-10-CM

## 2017-10-09 DIAGNOSIS — M8589 Other specified disorders of bone density and structure, multiple sites: Secondary | ICD-10-CM

## 2017-10-09 DIAGNOSIS — C50311 Malignant neoplasm of lower-inner quadrant of right female breast: Secondary | ICD-10-CM

## 2017-11-06 ENCOUNTER — Other Ambulatory Visit: Payer: Self-pay | Admitting: Internal Medicine

## 2017-11-06 DIAGNOSIS — M8589 Other specified disorders of bone density and structure, multiple sites: Secondary | ICD-10-CM

## 2017-11-06 DIAGNOSIS — Z170 Estrogen receptor positive status [ER+]: Principal | ICD-10-CM

## 2017-11-06 DIAGNOSIS — C50311 Malignant neoplasm of lower-inner quadrant of right female breast: Secondary | ICD-10-CM

## 2017-11-24 ENCOUNTER — Other Ambulatory Visit: Payer: Self-pay | Admitting: Internal Medicine

## 2017-11-24 DIAGNOSIS — C50311 Malignant neoplasm of lower-inner quadrant of right female breast: Secondary | ICD-10-CM

## 2017-12-01 ENCOUNTER — Ambulatory Visit
Admission: RE | Admit: 2017-12-01 | Discharge: 2017-12-01 | Disposition: A | Discharge status: Home / Self Care | Payer: Medicare HMO | Source: Ambulatory Visit | Attending: Internal Medicine | Admitting: Internal Medicine

## 2017-12-01 DIAGNOSIS — Z1231 Encounter for screening mammogram for malignant neoplasm of breast: Secondary | ICD-10-CM | POA: Diagnosis not present

## 2017-12-01 DIAGNOSIS — Z853 Personal history of malignant neoplasm of breast: Secondary | ICD-10-CM | POA: Insufficient documentation

## 2017-12-01 DIAGNOSIS — Z170 Estrogen receptor positive status [ER+]: Secondary | ICD-10-CM

## 2017-12-01 DIAGNOSIS — C50311 Malignant neoplasm of lower-inner quadrant of right female breast: Secondary | ICD-10-CM

## 2017-12-03 ENCOUNTER — Other Ambulatory Visit: Payer: Self-pay | Admitting: Internal Medicine

## 2017-12-03 DIAGNOSIS — Z170 Estrogen receptor positive status [ER+]: Principal | ICD-10-CM

## 2017-12-03 DIAGNOSIS — M8589 Other specified disorders of bone density and structure, multiple sites: Secondary | ICD-10-CM

## 2017-12-03 DIAGNOSIS — C50311 Malignant neoplasm of lower-inner quadrant of right female breast: Secondary | ICD-10-CM

## 2017-12-17 DIAGNOSIS — R69 Illness, unspecified: Secondary | ICD-10-CM | POA: Diagnosis not present

## 2018-02-22 ENCOUNTER — Other Ambulatory Visit: Payer: Self-pay | Admitting: Internal Medicine

## 2018-02-22 DIAGNOSIS — C50311 Malignant neoplasm of lower-inner quadrant of right female breast: Secondary | ICD-10-CM

## 2018-03-03 ENCOUNTER — Telehealth: Payer: Self-pay | Admitting: Family Medicine

## 2018-03-03 DIAGNOSIS — E559 Vitamin D deficiency, unspecified: Secondary | ICD-10-CM

## 2018-03-03 DIAGNOSIS — E039 Hypothyroidism, unspecified: Secondary | ICD-10-CM

## 2018-03-03 DIAGNOSIS — E7800 Pure hypercholesterolemia, unspecified: Secondary | ICD-10-CM

## 2018-03-03 DIAGNOSIS — R7309 Other abnormal glucose: Secondary | ICD-10-CM

## 2018-03-03 DIAGNOSIS — I10 Essential (primary) hypertension: Secondary | ICD-10-CM

## 2018-03-03 NOTE — Telephone Encounter (Signed)
-----   Message from Eustace Pen, LPN sent at 02/28/1759  4:19 PM EST ----- Regarding: Labs 1/10 Lab orders needed. Thank you.  Insurance:  [employer]

## 2018-03-04 ENCOUNTER — Ambulatory Visit (INDEPENDENT_AMBULATORY_CARE_PROVIDER_SITE_OTHER): Payer: Medicare HMO

## 2018-03-04 ENCOUNTER — Ambulatory Visit: Payer: Medicare HMO

## 2018-03-04 VITALS — BP 150/70 | HR 77 | Temp 97.7°F | Ht 60.0 in | Wt 132.5 lb

## 2018-03-04 DIAGNOSIS — R7309 Other abnormal glucose: Secondary | ICD-10-CM | POA: Diagnosis not present

## 2018-03-04 DIAGNOSIS — E559 Vitamin D deficiency, unspecified: Secondary | ICD-10-CM | POA: Diagnosis not present

## 2018-03-04 DIAGNOSIS — I10 Essential (primary) hypertension: Secondary | ICD-10-CM

## 2018-03-04 DIAGNOSIS — Z0000 Encounter for general adult medical examination without abnormal findings: Secondary | ICD-10-CM

## 2018-03-04 DIAGNOSIS — E039 Hypothyroidism, unspecified: Secondary | ICD-10-CM

## 2018-03-04 DIAGNOSIS — E7800 Pure hypercholesterolemia, unspecified: Secondary | ICD-10-CM | POA: Diagnosis not present

## 2018-03-04 LAB — LIPID PANEL
CHOL/HDL RATIO: 2
Cholesterol: 187 mg/dL (ref 0–200)
HDL: 75 mg/dL (ref >39.00)
LDL Cholesterol: 83 mg/dL (ref 0–99)
NONHDL: 112.18
Triglycerides: 144 mg/dL (ref 0.0–149.0)
VLDL: 28.8 mg/dL (ref 0.0–40.0)

## 2018-03-04 LAB — COMPREHENSIVE METABOLIC PANEL
ALBUMIN: 4.6 g/dL (ref 3.5–5.2)
ALT: 13 U/L (ref 0–35)
AST: 23 U/L (ref 0–37)
Alkaline Phosphatase: 51 U/L (ref 39–117)
BUN: 11 mg/dL (ref 6–23)
CALCIUM: 9.6 mg/dL (ref 8.4–10.5)
CHLORIDE: 106 meq/L (ref 96–112)
CO2: 27 mEq/L (ref 19–32)
Creatinine, Ser: 0.93 mg/dL (ref 0.40–1.20)
GFR: 62.35 mL/min (ref >60.00)
Glucose, Bld: 100 mg/dL — ABNORMAL HIGH (ref 70–99)
POTASSIUM: 3.7 meq/L (ref 3.5–5.1)
SODIUM: 142 meq/L (ref 135–145)
Total Bilirubin: 0.6 mg/dL (ref 0.2–1.2)
Total Protein: 7.3 g/dL (ref 6.0–8.3)

## 2018-03-04 LAB — CBC WITH DIFFERENTIAL/PLATELET
BASOS PCT: 1.8 % (ref 0.0–3.0)
Basophils Absolute: 0.1 10*3/uL (ref 0.0–0.1)
Eosinophils Absolute: 0.2 10*3/uL (ref 0.0–0.7)
Eosinophils Relative: 2.8 % (ref 0.0–5.0)
HCT: 40.1 % (ref 36.0–46.0)
HEMOGLOBIN: 13.6 g/dL (ref 12.0–15.0)
Lymphocytes Relative: 27.7 % (ref 12.0–46.0)
Lymphs Abs: 1.7 10*3/uL (ref 0.7–4.0)
MCHC: 34 g/dL (ref 30.0–36.0)
MCV: 92.8 fl (ref 78.0–100.0)
MONOS PCT: 9.5 % (ref 3.0–12.0)
Monocytes Absolute: 0.6 10*3/uL (ref 0.1–1.0)
Neutro Abs: 3.6 10*3/uL (ref 1.4–7.7)
Neutrophils Relative %: 58.2 % (ref 43.0–77.0)
Platelets: 300 10*3/uL (ref 150.0–400.0)
RBC: 4.32 Mil/uL (ref 3.87–5.11)
RDW: 13.2 % (ref 11.5–15.5)
WBC: 6.2 10*3/uL (ref 4.0–10.5)

## 2018-03-04 LAB — HEMOGLOBIN A1C: Hgb A1c MFr Bld: 5.9 % (ref 4.6–6.5)

## 2018-03-04 LAB — TSH: TSH: 7.69 u[IU]/mL — ABNORMAL HIGH (ref 0.35–4.50)

## 2018-03-04 LAB — VITAMIN D 25 HYDROXY (VIT D DEFICIENCY, FRACTURES): VITD: 41.44 ng/mL (ref 30.00–100.00)

## 2018-03-04 NOTE — Progress Notes (Signed)
Subjective:   Madison Huerta is a 78 y.o. female who presents for Medicare Annual (Subsequent) preventive examination.  Review of Systems:  N/A Cardiac Risk Factors include: advanced age (>41men, >22 women);dyslipidemia;hypertension     Objective:     Vitals: BP (!) 150/70 (BP Location: Right Arm, Patient Position: Sitting, Cuff Size: Normal) Comment: BP medication not taken  Pulse 77   Temp 97.7 F (36.5 C) (Oral)   Ht 5' (1.524 m) Comment: no shoes  Wt 132 lb 8 oz (60.1 kg)   SpO2 98%   BMI 25.88 kg/m   Body mass index is 25.88 kg/m.  Advanced Directives 03/04/2018 07/14/2017 03/03/2017 07/13/2016 05/18/2016 02/21/2016 11/18/2015  Does Patient Have a Medical Advance Directive? Yes Yes Yes Yes Yes Yes Yes  Type of Paramedic of [place];Living will Living will;Healthcare Power of [place];Living will [place];Living will [place];Living will [place];Living will [place];Living will  Does patient want to make changes to medical advance directive? - No - Patient declined - - No - Patient declined - No - Patient declined  Copy of [place] in Chart? No - copy requested No - copy requested No - copy requested No - copy requested Yes No - copy requested No - copy requested  Would patient like information on creating a medical advance directive? - - - - - - -    Tobacco Social History   Tobacco Use  Smoking Status Never Smoker  Smokeless Tobacco Never Used     Counseling given: No   Clinical Intake:  Pre-visit preparation completed: Yes  Pain : No/denies pain Pain Score: 0-No pain     Nutritional Status: BMI 25 -29 Overweight Nutritional Risks: None Diabetes: No  How often do you need to have someone help you when you read instructions, pamphlets, or other written materials from your doctor or pharmacy?: 1 - Never What  is the last grade level you completed in school?: 12th grade  Interpreter Needed?: No  Comments: pt lives with spouse Information entered by :: LPinson, LPN  Past Medical History:  Diagnosis Date  . Breast cancer ([place]) 1994   L breast- lumpectomy, care for at [facility]   . Breast cancer [facility]) 2013   right breast- mastectomy, care at [facility]  . Cataract   . Endocarditis   . GERD (gastroesophageal reflux disease)   . Heart murmur    VSD- echocardiogram - [facility] 10 yrs. ago  . History of breast cancer   . Hyperlipidemia   . Hypertension   . Hypothyroidism   . Osteopenia   . Overweight(278.02)   . Personal history of radiation therapy 1994   left breast ca  . Thyroid disease    Hypothyroidism  . VSD (ventricular septal defect)    does need SBE prophylaxis (Keflex  3 gm 1 hour pprior and 1.5 gm 6 hours post  . VSD (ventricular septal defect)    Past Surgical History:  Procedure Laterality Date  . 2D echo  2004   [facility] at [facility]  . AUGMENTATION MAMMAPLASTY Bilateral    removed 1994  . BREAST BIOPSY Left 1994   breast ca  . BREAST BIOPSY Right 2013   breast ca  . BREAST IMPLANT REMOVAL  1994  . BREAST LUMPECTOMY Left 1994   lumpectomy and rad tx and tamoxifen  . MASTECTOMY Right 01/08/2012   complete and anastrozole  . MASTECTOMY  W/ SENTINEL NODE BIOPSY  01/08/2012   Procedure: MASTECTOMY WITH SENTINEL LYMPH NODE BIOPSY;  Surgeon: Madilyn Hook, DO;  Location: [place];  Service: General;  Laterality: Right;  right breast mastectomy with sentinel lymph node biopsy   . PARATHYROIDECTOMY  12/2002  . TONSILLECTOMY     As a child  . TUBAL LIGATION     1976   Family History  Problem Relation Age of Onset  . Aneurysm Mother        brain  . Cancer Father        Lung, Liver mets  . Lung cancer Father 65  . Heart disease Other   . Cancer Cousin        Breast CA  . Breast cancer Cousin 30  . Breast cancer Maternal Aunt 21   Social History   Socioeconomic History  . Marital  status: Married    Spouse name: Not on file  . Number of children: 2  . Years of education: Not on file  . Highest education level: Not on file  Occupational History  . Occupation: Animator: UNEMPLOYED  Social Needs  . Financial resource strain: Not on file  . Food insecurity:    Worry: Not on file    Inability: Not on file  . Transportation needs:    Medical: Not on file    Non-medical: Not on file  Tobacco Use  . Smoking status: Never Smoker  . Smokeless tobacco: Never Used  Substance and Sexual Activity  . Alcohol use: Yes    Alcohol/week: 3.0 standard drinks    Types: 3 Glasses of wine per week  . Drug use: No  . Sexual activity: Never  Lifestyle  . Physical activity:    Days per week: Not on file    Minutes per session: Not on file  . Stress: Not on file  Relationships  . Social connections:    Talks on phone: Not on file    Gets together: Not on file    Attends religious service: Not on file    Active member of club or organization: Not on file    Attends meetings of clubs or organizations: Not on file    Relationship status: Not on file  Other Topics Concern  . Not on file  Social History Narrative  . Not on file    Outpatient Encounter Medications as of 03/04/2018  Medication Sig  . acetaminophen (TYLENOL) 500 MG tablet Take 500 mg by mouth every 4 (four) hours as needed for fever.  Marland Kitchen alendronate (FOSAMAX) 70 MG tablet TAKE 1 TABLET BY MOUTH ONCE A WEEK, TAKE WITH A FULL GLASS OF WATER ON AN EMPTY STOMACH  . amLODipine (NORVASC) 5 MG tablet Take 1 tablet (5 mg total) by mouth daily.  Marland Kitchen anastrozole (ARIMIDEX) 1 MG tablet TAKE 1 TABLET(1 MG) BY MOUTH DAILY  . cholecalciferol (VITAMIN D) 1000 units tablet Take 1,000 Units by mouth daily.  Marland Kitchen levothyroxine (SYNTHROID, LEVOTHROID) 50 MCG tablet Take 1 tablet (50 mcg total) by mouth daily.  . rosuvastatin (CRESTOR) 10 MG tablet Take 1 tablet (10 mg total) by mouth daily.   No  facility-administered encounter medications on file as of 03/04/2018.     Activities of Daily Living In your present state of health, do you have any difficulty performing the following activities: 03/04/2018  Hearing? N  Vision? N  Difficulty concentrating or making decisions? N  Walking or climbing stairs? N  Dressing or bathing?  N  Doing errands, shopping? N  Preparing Food and eating ? N  Using the Toilet? N  In the past six months, have you accidently leaked urine? N  Do you have problems with loss of bowel control? N  Managing your Medications? N  Managing your Finances? N  Housekeeping or managing your Housekeeping? N  Some recent data might be hidden    Patient Care Team: Tower, Wynelle Fanny, MD as PCP - General Rogue Bussing, Elisha Headland, MD as Consulting Physician (Internal Medicine) Karsten Fells, DDS as Consulting Physician (Dentistry) Kathyrn Lass, OD as Consulting Physician (Optometry)    Assessment:   This is a routine wellness examination for Doninique.   Hearing Screening   125Hz  250Hz  500Hz  1000Hz  2000Hz  3000Hz  4000Hz  6000Hz  8000Hz   Right ear:   40 40 40  40    Left ear:   40 40 40  40    Vision Screening Comments: Vision exam in 2019 with Dr. Atilano Median   Exercise Activities and Dietary recommendations Current Exercise Habits: Home exercise routine, Type of exercise: walking, Time (Minutes): 30, Frequency (Times/Week): 7, Weekly Exercise (Minutes/Week): 210, Intensity: Mild, Exercise limited by: None identified  Goals    . Increase physical activity     Weather permitting, I will continue to walk for 30 minutes daily.        Fall Risk Fall Risk  03/04/2018 03/03/2017 02/21/2016 02/26/2015 02/21/2014  Falls in the past year? 0 No No No Yes  Number falls in past yr: - - - - 1  Injury with Fall? - - - - No   Depression Screen PHQ 2/9 Scores 03/04/2018 03/03/2017 02/21/2016 02/26/2015  PHQ - 2 Score 0 0 0 0  PHQ- 9 Score 0 0 - -     Cognitive Function MMSE - Mini Mental  State Exam 03/04/2018 03/03/2017 02/21/2016  Orientation to time 5 5 5   Orientation to Place 5 5 5   Registration 3 3 3   Attention/ Calculation 0 0 0  Recall 3 3 3   Language- name 2 objects 0 0 0  Language- repeat 1 1 1   Language- follow 3 step command 3 3 3   Language- read & follow direction 0 0 0  Write a sentence 0 0 0  Copy design 0 0 0  Total score 20 20 20         Immunization History  Administered Date(s) Administered  . Influenza Split 11/20/2010  . Influenza Whole 12/15/2006, 01/23/2009, 12/13/2009, 11/16/2011  . Influenza, High Dose Seasonal PF 12/01/2014, 11/18/2016, 12/24/2017  . Influenza,inj,Quad PF,6+ Mos 10/21/2015  . Influenza-Unspecified 11/22/2012, 10/07/2013  . Pneumococcal Conjugate-13 02/21/2014  . Pneumococcal Polysaccharide-23 01/14/2004, 02/12/2010  . Td 11/28/2001  . Tdap 02/18/2011  . Zoster 02/06/2008  . Zoster Recombinat (Shingrix) 05/25/2017, 09/22/2017    Screening Tests Health Maintenance  Topic Date Due  . PAP SMEAR-Modifier  03/04/2027 (Originally 02/25/2017)  . MAMMOGRAM  12/02/2018  . TETANUS/TDAP  02/17/2021  . COLONOSCOPY  10/21/2026  . INFLUENZA VACCINE  Completed  . DEXA SCAN  Completed  . PNA vac Low Risk Adult  Completed       Plan:     I have personally reviewed, addressed, and noted the following in the patient's chart:  A. Medical and social history B. Use of alcohol, tobacco or illicit drugs  C. Current medications and supplements D. Functional ability and status E.  Nutritional status F.  Physical activity G. Advance directives H. List of other physicians I.  Hospitalizations, surgeries, and ER  visits in previous 12 months J.  [place] to include hearing, vision, cognitive, depression L. Referrals and appointments - none  In addition, I have reviewed and discussed with patient certain preventive protocols, quality metrics, and best practice recommendations. A written personalized care plan for preventive  services as well as general preventive health recommendations were provided to patient.  See attached scanned questionnaire for additional information.   Signed,   Lindell Noe, MHA, BS, LPN Health Coach

## 2018-03-04 NOTE — Progress Notes (Signed)
PCP notes:   Health maintenance:  No gaps identified.  Abnormal screenings:   None  Patient concerns:   None  Nurse concerns:  None  Next PCP appt:   03/08/18 @ 0930  I reviewed health advisor's note, was available for consultation, and agree with documentation and plan. Loura Pardon MD

## 2018-03-04 NOTE — Patient Instructions (Signed)
Madison Huerta , Thank you for taking time to come for your Medicare Wellness Visit. I appreciate your ongoing commitment to your health goals. Please review the following plan we discussed and let me know if I can assist you in the future.   These are the goals we discussed: Goals    . Increase physical activity     Weather permitting, I will continue to walk for 30 minutes daily.        This is a list of the screening recommended for you and due dates:  Health Maintenance  Topic Date Due  . Pap Smear  03/04/2027*  . Mammogram  12/02/2018  . Tetanus Vaccine  02/17/2021  . Colon Cancer Screening  10/21/2026  . Flu Shot  Completed  . DEXA scan (bone density measurement)  Completed  . Pneumonia vaccines  Completed  *Topic was postponed. The date shown is not the original due date.   Preventive Care for Adults  A healthy lifestyle and preventive care can promote health and wellness. Preventive health guidelines for adults include the following key practices.  . A routine yearly physical is a good way to check with your health care provider about your health and preventive screening. It is a chance to share any concerns and updates on your health and to receive a thorough exam.  . Visit your dentist for a routine exam and preventive care every 6 months. Brush your teeth twice a day and floss once a day. Good oral hygiene prevents tooth decay and gum disease.  . The frequency of eye exams is based on your age, health, family medical history, use  of contact lenses, and other factors. Follow your health care provider's recommendations for frequency of eye exams.  . Eat a healthy diet. Foods like vegetables, fruits, whole grains, low-fat dairy products, and lean protein foods contain the nutrients you need without too many calories. Decrease your intake of foods high in solid fats, added sugars, and salt. Eat the right amount of calories for you. Get information about a proper diet from your  health care provider, if necessary.  . Regular physical exercise is one of the most important things you can do for your health. Most adults should get at least 150 minutes of moderate-intensity exercise (any activity that increases your heart rate and causes you to sweat) each week. In addition, most adults need muscle-strengthening exercises on 2 or more days a week.  Silver Sneakers may be a benefit available to you. To determine eligibility, you may visit the website: www.silversneakers.com or contact program at 708-193-9839 Mon-Fri between 8AM-8PM.   . Maintain a healthy weight. The body mass index (BMI) is a screening tool to identify possible weight problems. It provides an estimate of body fat based on height and weight. Your health care provider can find your BMI and can help you achieve or maintain a healthy weight.   For adults 20 years and older: ? A BMI below 18.5 is considered underweight. ? A BMI of 18.5 to 24.9 is normal. ? A BMI of 25 to 29.9 is considered overweight. ? A BMI of 30 and above is considered obese.   . Maintain normal blood lipids and cholesterol levels by exercising and minimizing your intake of saturated fat. Eat a balanced diet with plenty of fruit and vegetables. Blood tests for lipids and cholesterol should begin at age 39 and be repeated every 5 years. If your lipid or cholesterol levels are high, you are over 50,  or you are at high risk for heart disease, you may need your cholesterol levels checked more frequently. Ongoing high lipid and cholesterol levels should be treated with medicines if diet and exercise are not working.  . If you smoke, find out from your health care provider how to quit. If you do not use tobacco, please do not start.  . If you choose to drink alcohol, please do not consume more than 2 drinks per day. One drink is considered to be 12 ounces (355 mL) of beer, 5 ounces (148 mL) of wine, or 1.5 ounces (44 mL) of liquor.  . If you are  17-69 years old, ask your health care provider if you should take aspirin to prevent strokes.  . Use sunscreen. Apply sunscreen liberally and repeatedly throughout the day. You should seek shade when your shadow is shorter than you. Protect yourself by wearing long sleeves, pants, a wide-brimmed hat, and sunglasses year round, whenever you are outdoors.  . Once a month, do a whole body skin exam, using a mirror to look at the skin on your back. Tell your health care provider of new moles, moles that have irregular borders, moles that are larger than a pencil eraser, or moles that have changed in shape or color.

## 2018-03-08 ENCOUNTER — Encounter: Payer: Medicare HMO | Admitting: Family Medicine

## 2018-03-10 ENCOUNTER — Encounter: Payer: Medicare HMO | Admitting: Family Medicine

## 2018-03-17 ENCOUNTER — Ambulatory Visit (INDEPENDENT_AMBULATORY_CARE_PROVIDER_SITE_OTHER): Payer: Medicare HMO | Admitting: Family Medicine

## 2018-03-17 ENCOUNTER — Encounter: Payer: Self-pay | Admitting: Family Medicine

## 2018-03-17 VITALS — BP 139/72 | HR 72 | Temp 97.8°F | Ht 60.0 in | Wt 133.0 lb

## 2018-03-17 DIAGNOSIS — M8589 Other specified disorders of bone density and structure, multiple sites: Secondary | ICD-10-CM

## 2018-03-17 DIAGNOSIS — E039 Hypothyroidism, unspecified: Secondary | ICD-10-CM | POA: Diagnosis not present

## 2018-03-17 DIAGNOSIS — Z170 Estrogen receptor positive status [ER+]: Secondary | ICD-10-CM

## 2018-03-17 DIAGNOSIS — I10 Essential (primary) hypertension: Secondary | ICD-10-CM

## 2018-03-17 DIAGNOSIS — R7309 Other abnormal glucose: Secondary | ICD-10-CM | POA: Diagnosis not present

## 2018-03-17 DIAGNOSIS — C50311 Malignant neoplasm of lower-inner quadrant of right female breast: Secondary | ICD-10-CM

## 2018-03-17 DIAGNOSIS — Z0000 Encounter for general adult medical examination without abnormal findings: Secondary | ICD-10-CM

## 2018-03-17 DIAGNOSIS — E7800 Pure hypercholesterolemia, unspecified: Secondary | ICD-10-CM

## 2018-03-17 DIAGNOSIS — E559 Vitamin D deficiency, unspecified: Secondary | ICD-10-CM | POA: Diagnosis not present

## 2018-03-17 MED ORDER — ROSUVASTATIN CALCIUM 10 MG PO TABS: 10.0000 mg | ORAL_TABLET | Freq: Every day | ORAL | 3 refills | Status: DC

## 2018-03-17 MED ORDER — AMLODIPINE BESYLATE 5 MG PO TABS: 5.0000 mg | ORAL_TABLET | Freq: Every day | ORAL | 3 refills | Status: DC

## 2018-03-17 MED ORDER — LEVOTHYROXINE SODIUM 75 MCG PO TABS: 75.0000 ug | ORAL_TABLET | Freq: Every day | ORAL | 3 refills | Status: DC

## 2018-03-17 NOTE — Assessment & Plan Note (Signed)
Reviewed health habits including diet and exercise and skin cancer prevention Reviewed appropriate screening tests for age  Also reviewed health mt list, fam hx and immunization status , as well as social and family history   See [facility] reviewed amw reviewed  Good health habits- commended

## 2018-03-17 NOTE — Assessment & Plan Note (Signed)
dexa 9/18  tx by her oncologist with fosamax  No falls or fx  Taking D and Ca On arimidex (risk factor)-continue to monitor

## 2018-03-17 NOTE — Assessment & Plan Note (Signed)
Disc goals for lipids and reasons to control them Rev last labs with pt Rev low sat fat diet in detail  Will continue crestor an diet-good control

## 2018-03-17 NOTE — Assessment & Plan Note (Signed)
Lab Results  Component Value Date   TSH 7.69 (H) 03/04/2018   Increasing levothy to 75 mcg TSH planned for 6 wk  This should help fatigue

## 2018-03-17 NOTE — Assessment & Plan Note (Signed)
bp in fair control at this time  BP Readings from Last 1 Encounters:  03/17/18 139/72   No changes needed Most recent labs reviewed  Disc lifstyle change with low sodium diet and exercise

## 2018-03-17 NOTE — Patient Instructions (Addendum)
We need to increase thyroid dose to 75 mcg  Stop at check out to schedule follow up labs for 6 weeks   Keep taking good care of yourself  Eat healthy  Exercise  Keep playing bridge

## 2018-03-17 NOTE — Assessment & Plan Note (Signed)
Disc imp of D to bone and overall health Level is tx  Continue current dosing

## 2018-03-17 NOTE — Progress Notes (Signed)
Subjective:    Patient ID: Madison Huerta, female    DOB: [birth year], 78 y.o.   MRN: [id number]  HPI Here for health maintenance exam and to review chronic medical problems    Doing pretty well  Feels her age  Taking care of herself   Lives farther away now  Will have to get est at the [place] office   Wt Readings from Last 3 Encounters:  03/17/18 133 lb (60.3 kg)  03/04/18 132 lb 8 oz (60.1 kg)  07/14/17 131 lb 3.2 oz (59.5 kg)  exercising  Doing weights  Eating healthy for the most part  25.97 kg/m    Had amw on 1/10  No concerns   Mammogram 10/19  Self breast exam -no lumps  arimidex for breast cancer hx  (h/o R mastectomy)-nothing new /doing well  Will take it for 10 years   No gyn problems or vaginal bleeding   Had to have hemorrhoid banding -it worked well   Colonoscopy 8/18 neg with Dr Allyn Kenner 5 y recall   dexa 9/18 -osteopenia  Fosamax from oncology  D level is tx Taking vit D and ca   shingrix vaccine utd  bp is stable today  No cp or palpitations or headaches or edema  No side effects to medicines  BP Readings from Last 3 Encounters:  03/17/18 140/76  03/04/18 (!) 150/70  07/14/17 (!) 156/74     Hypothyroidism  Pt has no clinical changes Has been more tired  Lab Results  Component Value Date   TSH 7.69 (H) 03/04/2018    Currently on 50 mcg   Glucose level 100 fasting Lab Results  Component Value Date   HGBA1C 5.9 03/04/2018   Hyperlipidemia Lab Results  Component Value Date   CHOL 187 03/04/2018   CHOL 162 02/24/2017   CHOL 192 02/21/2016   Lab Results  Component Value Date   HDL 75.00 03/04/2018   HDL 62.40 02/24/2017   HDL 77.50 02/21/2016   Lab Results  Component Value Date   LDLCALC 83 03/04/2018   LDLCALC 78 02/24/2017   LDLCALC 90 02/21/2016   Lab Results  Component Value Date   TRIG 144.0 03/04/2018   TRIG 111.0 02/24/2017   TRIG 125.0 02/21/2016   Lab Results  Component Value Date   CHOLHDL 2  03/04/2018   CHOLHDL 3 02/24/2017   CHOLHDL 2 02/21/2016   Lab Results  Component Value Date   LDLDIRECT 107.1 02/06/2010   crestor and diet   Patient Active Problem List   Diagnosis Date Noted  . Elevated glucose level 02/28/2016  . 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 Routine general medical examination at a health care facility 02/26/2015  . Encounter for routine gynecological examination 02/26/2015  . 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 VSD (ventricular septal defect)   . 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 Encounter for Medicare annual wellness exam 02/20/2013  . Routine gynecological examination 02/19/2012  . PND (post-nasal drip) 12/25/2010  . Vitamin D deficiency 04/29/2009  . Hypothyroidism 02/06/2008  . Hyperlipidemia 01/26/2007  . Essential hypertension 01/26/2007  . Osteopenia 01/26/2007  . BREAST CANCER, HX OF 01/26/2007  . VENTRICULAR SEPTAL DEFECT, HX OF 01/26/2007   Past Medical History:  Diagnosis Date  . Breast cancer ([place]) 1994   L breast- lumpectomy, care for at [facility]   . Breast cancer [facility]) 2013   right breast- mastectomy, care at [facility]  . Cataract   .  Endocarditis   . GERD (gastroesophageal reflux disease)   . Heart murmur    VSD- echocardiogram - [facility] 10 yrs. ago  . History of breast cancer   . Hyperlipidemia   . Hypertension   . Hypothyroidism   . Osteopenia   . Overweight(278.02)   . Personal history of radiation therapy 1994   left breast ca  . Thyroid disease    Hypothyroidism  . VSD (ventricular septal defect)    does need SBE prophylaxis (Keflex  3 gm 1 hour pprior and 1.5 gm 6 hours post  . VSD (ventricular septal defect)    Past Surgical History:  Procedure Laterality Date  . 2D echo  2004   [facility] at [facility]  . AUGMENTATION MAMMAPLASTY Bilateral    removed 1994  . BREAST BIOPSY Left 1994   breast ca  . BREAST BIOPSY Right 2013   breast ca  . BREAST IMPLANT  REMOVAL  1994  . BREAST LUMPECTOMY Left 1994   lumpectomy and rad tx and tamoxifen  . MASTECTOMY Right 01/08/2012   complete and anastrozole  . MASTECTOMY W/ SENTINEL NODE BIOPSY  01/08/2012   Procedure: MASTECTOMY WITH SENTINEL LYMPH NODE BIOPSY;  Surgeon: Madilyn Hook, DO;  Location: [place];  Service: General;  Laterality: Right;  right breast mastectomy with sentinel lymph node biopsy   . PARATHYROIDECTOMY  12/2002  . TONSILLECTOMY     As a child  . TUBAL LIGATION     1976   Social History   Tobacco Use  . Smoking status: Never Smoker  . Smokeless tobacco: Never Used  Substance Use Topics  . Alcohol use: Yes    Alcohol/week: 3.0 standard drinks    Types: 3 Glasses of wine per week  . Drug use: No   Family History  Problem Relation Age of Onset  . Aneurysm Mother        brain  . Cancer Father        Lung, Liver mets  . Lung cancer Father 84  . Heart disease Other   . Cancer Cousin        Breast CA  . Breast cancer Cousin 30  . Breast cancer Maternal Aunt 55   Allergies  Allergen Reactions  . Codeine Other (See Comments)    Reaction:  Severe headaches   . Penicillins Hives and Other (See Comments)    Has patient had a PCN reaction causing immediate rash, facial/tongue/throat swelling, SOB or lightheadedness with hypotension: No Has patient had a PCN reaction causing severe rash involving mucus membranes or skin necrosis: No Has patient had a PCN reaction that required hospitalization No Has patient had a PCN reaction occurring within the last 10 years: No If all of the above answers are "NO", then may proceed with Cephalosporin use.  . Adhesive [Tape] Rash   Current Outpatient Medications on File Prior to Visit  Medication Sig Dispense Refill  . acetaminophen (TYLENOL) 500 MG tablet Take 500 mg by mouth every 4 (four) hours as needed for fever.    Marland Kitchen alendronate (FOSAMAX) 70 MG tablet TAKE 1 TABLET BY MOUTH ONCE A WEEK, TAKE WITH A FULL GLASS OF WATER ON AN EMPTY  STOMACH 12 tablet 1  . anastrozole (ARIMIDEX) 1 MG tablet TAKE 1 TABLET(1 MG) BY MOUTH DAILY 90 tablet 0  . cholecalciferol (VITAMIN D) 1000 units tablet Take 1,000 Units by mouth daily.     No current facility-administered medications on file prior to visit.     Review  of Systems  Constitutional: Negative for activity change, appetite change, fatigue, fever and unexpected weight change.  HENT: Negative for congestion, ear pain, rhinorrhea, sinus pressure and sore throat.   Eyes: Negative for pain, redness and visual disturbance.  Respiratory: Negative for cough, shortness of breath and wheezing.   Cardiovascular: Negative for chest pain and palpitations.  Gastrointestinal: Negative for abdominal pain, blood in stool, constipation and diarrhea.  Endocrine: Negative for polydipsia and polyuria.  Genitourinary: Negative for dysuria, frequency and urgency.  Musculoskeletal: Negative for arthralgias, back pain and myalgias.  Skin: Negative for pallor and rash.  Allergic/Immunologic: Negative for environmental allergies.  Neurological: Negative for dizziness, syncope and headaches.  Hematological: Negative for adenopathy. Does not bruise/bleed easily.  Psychiatric/Behavioral: Negative for decreased concentration and dysphoric mood. The patient is not nervous/anxious.        Objective:   Physical Exam Constitutional:      General: She is not in acute distress.    Appearance: Normal appearance. She is well-developed and normal weight. She is not ill-appearing.  HENT:     Head: Normocephalic and atraumatic.     Right Ear: Tympanic membrane, ear canal and external ear normal.     Left Ear: Tympanic membrane, ear canal and external ear normal.     Nose: Nose normal.     Mouth/Throat:     Mouth: Mucous membranes are moist.     Pharynx: Oropharynx is clear.  Eyes:     General: No scleral icterus.    Conjunctiva/sclera: Conjunctivae normal.     Pupils: Pupils are equal, round, and reactive  to light.  Neck:     Musculoskeletal: Normal range of motion and neck supple.     Thyroid: No thyromegaly.     Vascular: No carotid bruit or JVD.  Cardiovascular:     Rate and Rhythm: Normal rate and regular rhythm.     Pulses: Normal pulses.     Heart sounds: Murmur present. No gallop.   Pulmonary:     Effort: Pulmonary effort is normal. No respiratory distress.     Breath sounds: Normal breath sounds. No wheezing.  Chest:     Chest wall: No tenderness.  Abdominal:     General: Bowel sounds are normal. There is no distension or abdominal bruit.     Palpations: Abdomen is soft. There is no mass.     Tenderness: There is no abdominal tenderness.  Genitourinary:    Comments: R mastectomy-site is clear of M or tenderness  L breast: Breast exam: No mass, nodules, thickening, tenderness, bulging, retraction, inflamation, nipple discharge or skin changes noted.  No axillary or clavicular LA.    (baseline scarring noted)  Musculoskeletal: Normal range of motion.        General: No tenderness.  Lymphadenopathy:     Cervical: No cervical adenopathy.  Skin:    General: Skin is warm and dry.     Capillary Refill: Capillary refill takes less than 2 seconds.     Coloration: Skin is not pale.     Findings: No erythema or rash.     Comments: Solar lentigines diffusely   Neurological:     General: No focal deficit present.     Mental Status: She is alert.     Cranial Nerves: No cranial nerve deficit.     Motor: No abnormal muscle tone.     Coordination: Coordination normal.     Deep Tendon Reflexes: Reflexes are normal and symmetric.  Psychiatric:  Mood and Affect: Mood normal.           Assessment & Plan:   Problem List Items Addressed This Visit      Cardiovascular and Mediastinum   Essential hypertension    bp in fair control at this time  BP Readings from Last 1 Encounters:  03/17/18 139/72   No changes needed Most recent labs reviewed  Disc lifstyle change with  low sodium diet and exercise        Relevant Medications   amLODipine (NORVASC) 5 MG tablet   rosuvastatin (CRESTOR) 10 MG tablet     Endocrine   Hypothyroidism    Lab Results  Component Value Date   TSH 7.69 (H) 03/04/2018   Increasing levothy to 75 mcg TSH planned for 6 wk  This should help fatigue       Relevant Medications   levothyroxine (SYNTHROID, LEVOTHROID) 75 MCG tablet     Musculoskeletal and Integument   Osteopenia    dexa 9/18  tx by her oncologist with fosamax  No falls or fx  Taking D and Ca On arimidex (risk factor)-continue to monitor         Other   Vitamin D deficiency    Disc imp of D to bone and overall health Level is tx  Continue current dosing       Hyperlipidemia    Disc goals for lipids and reasons to control them Rev last labs with pt Rev low sat fat diet in detail  Will continue crestor an diet-good control       Relevant Medications   amLODipine (NORVASC) 5 MG tablet   rosuvastatin (CRESTOR) 10 MG tablet   Routine general medical examination at a health care facility - Primary    Reviewed health habits including diet and exercise and skin cancer prevention Reviewed appropriate screening tests for age  Also reviewed health mt list, fam hx and immunization status , as well as social and family history   See [facility] Labs reviewed amw reviewed  Good health habits- commended       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

## 2018-03-17 NOTE — Assessment & Plan Note (Signed)
Lab Results  Component Value Date   HGBA1C 5.9 03/04/2018   disc imp of low glycemic diet and wt loss to prevent DM2

## 2018-03-22 ENCOUNTER — Other Ambulatory Visit: Payer: Self-pay | Admitting: Family Medicine

## 2018-03-31 DIAGNOSIS — H1033 Unspecified acute conjunctivitis, bilateral: Secondary | ICD-10-CM | POA: Diagnosis not present

## 2018-03-31 DIAGNOSIS — J069 Acute upper respiratory infection, unspecified: Secondary | ICD-10-CM | POA: Diagnosis not present

## 2018-03-31 DIAGNOSIS — J029 Acute pharyngitis, unspecified: Secondary | ICD-10-CM | POA: Diagnosis not present

## 2018-03-31 DIAGNOSIS — J019 Acute sinusitis, unspecified: Secondary | ICD-10-CM | POA: Diagnosis not present

## 2018-04-09 DIAGNOSIS — J111 Influenza due to unidentified influenza virus with other respiratory manifestations: Secondary | ICD-10-CM | POA: Diagnosis not present

## 2018-04-22 ENCOUNTER — Telehealth: Payer: Self-pay | Admitting: Family Medicine

## 2018-04-22 NOTE — Telephone Encounter (Signed)
Okay with me 

## 2018-04-22 NOTE — Telephone Encounter (Signed)
Copied from [place]. Topic: Quick Communication - See Telephone Encounter >> Apr 22, 2018  8:56 AM Antonieta Iba C wrote: CRM for notification. See Telephone encounter for: 04/22/18.  Pt called in to schedule a TOC visit. Pt says that she would like to switch from Dr. Glori Bickers to Dr. Ethelene Hal. Pt says that she now lives closer to the [place] location.   Please advise.

## 2018-04-22 NOTE — Telephone Encounter (Signed)
That is fine with me if ok with other provider

## 2018-04-25 NOTE — Telephone Encounter (Signed)
Okay to schedule patient. 

## 2018-04-26 NOTE — Telephone Encounter (Signed)
Patient scheduled for [facility] appt on 05/04/2018 @ 9:30am wit Dr. Ethelene Hal.

## 2018-04-28 ENCOUNTER — Other Ambulatory Visit: Payer: Medicare HMO

## 2018-05-02 ENCOUNTER — Other Ambulatory Visit (INDEPENDENT_AMBULATORY_CARE_PROVIDER_SITE_OTHER): Payer: Medicare HMO

## 2018-05-02 DIAGNOSIS — E039 Hypothyroidism, unspecified: Secondary | ICD-10-CM | POA: Diagnosis not present

## 2018-05-02 LAB — TSH: TSH: 0.35 u[IU]/mL (ref 0.35–4.50)

## 2018-05-04 ENCOUNTER — Other Ambulatory Visit: Payer: Self-pay

## 2018-05-04 ENCOUNTER — Ambulatory Visit: Payer: Medicare HMO | Admitting: Family Medicine

## 2018-05-04 ENCOUNTER — Encounter: Payer: Self-pay | Admitting: Family Medicine

## 2018-05-04 VITALS — BP 120/70 | HR 100 | Ht 60.0 in | Wt 131.5 lb

## 2018-05-04 DIAGNOSIS — R0982 Postnasal drip: Secondary | ICD-10-CM

## 2018-05-04 MED ORDER — FLUTICASONE PROPIONATE 50 MCG/ACT NA SUSP: 2.0000 | Freq: Every day | NASAL | 6 refills | Status: DC

## 2018-05-04 NOTE — Progress Notes (Signed)
Established Patient Office Visit  Subjective:  Patient ID: Madison Huerta, female    DOB: 10/14/1940  Age: 78 y.o. MRN: [id number]  CC:  Chief Complaint  Patient presents with  . Establish Care    HPI Madison Huerta presents for establishment of care by way of transfer.  Patient has a history of hypertension that is been well controlled with amlodipine.  Hypothyroidism is treated with Synthroid.  She is taking Crestor for primary prevention.  Recent history of endocarditis treated with 7 weeks of IV antibiotics.  History of breast cancer status post right mastectomy.  She is in remission and taking Arumadex.  Patient has been experiencing postnasal drip status post recent URI.  She denies fevers chills purulent rhinorrhea facial pressure or teeth pain.  She does have a history of springtime allergy rhinitis.  She feels as though that may be starting as well.  Past Medical History:  Diagnosis Date  . Breast cancer ([place]) 1994   L breast- lumpectomy, care for at [facility]   . Breast cancer [facility]) 2013   right breast- mastectomy, care at [facility]  . Cataract   . Endocarditis   . GERD (gastroesophageal reflux disease)   . Heart murmur    VSD- echocardiogram - [facility] 10 yrs. ago  . History of breast cancer   . Hyperlipidemia   . Hypertension   . Hypothyroidism   . Osteopenia   . Overweight(278.02)   . Personal history of radiation therapy 1994   left breast ca  . Thyroid disease    Hypothyroidism  . VSD (ventricular septal defect)    does need SBE prophylaxis (Keflex  3 gm 1 hour pprior and 1.5 gm 6 hours post  . VSD (ventricular septal defect)     Past Surgical History:  Procedure Laterality Date  . 2D echo  2004   Normal at [facility]  . AUGMENTATION MAMMAPLASTY Bilateral    removed 1994  . BREAST BIOPSY Left 1994   breast ca  . BREAST BIOPSY Right 2013   breast ca  . BREAST IMPLANT REMOVAL  1994  . BREAST LUMPECTOMY Left 1994   lumpectomy and rad tx and tamoxifen  . MASTECTOMY  Right 01/08/2012   complete and anastrozole  . MASTECTOMY W/ SENTINEL NODE BIOPSY  01/08/2012   Procedure: MASTECTOMY WITH SENTINEL LYMPH NODE BIOPSY;  Surgeon: Madilyn Hook, DO;  Location: [place];  Service: General;  Laterality: Right;  right breast mastectomy with sentinel lymph node biopsy   . PARATHYROIDECTOMY  12/2002  . TONSILLECTOMY     As a child  . TUBAL LIGATION     1976    Family History  Problem Relation Age of Onset  . Aneurysm Mother        brain  . Cancer Father        Lung, Liver mets  . Lung cancer Father 94  . Heart disease Other   . Cancer Cousin        Breast CA  . Breast cancer Cousin 30  . Breast cancer Maternal Aunt 107    Social History   Socioeconomic History  . Marital status: Married    Spouse name: Not on file  . Number of children: 2  . Years of education: Not on file  . Highest education level: Not on file  Occupational History  . Occupation: Animator: UNEMPLOYED  Social Needs  . Financial resource strain: Not on file  . Food insecurity:  Worry: Not on file    Inability: Not on file  . Transportation needs:    Medical: Not on file    Non-medical: Not on file  Tobacco Use  . Smoking status: Never Smoker  . Smokeless tobacco: Never Used  Substance and Sexual Activity  . Alcohol use: Yes    Alcohol/week: 3.0 standard drinks    Types: 3 Glasses of wine per week  . Drug use: No  . Sexual activity: Never  Lifestyle  . Physical activity:    Days per week: Not on file    Minutes per session: Not on file  . Stress: Not on file  Relationships  . Social connections:    Talks on phone: Not on file    Gets together: Not on file    Attends religious service: Not on file    Active member of club or organization: Not on file    Attends meetings of clubs or organizations: Not on file    Relationship status: Not on file  . Intimate partner violence:    Fear of current or ex partner: Not on file    Emotionally abused:  Not on file    Physically abused: Not on file    Forced sexual activity: Not on file  Other Topics Concern  . Not on file  Social History Narrative  . Not on file    Outpatient Medications Prior to Visit  Medication Sig Dispense Refill  . acetaminophen (TYLENOL) 500 MG tablet Take 500 mg by mouth every 4 (four) hours as needed for fever.    Marland Kitchen alendronate (FOSAMAX) 70 MG tablet TAKE 1 TABLET BY MOUTH ONCE A WEEK, TAKE WITH A FULL GLASS OF WATER ON AN EMPTY STOMACH 12 tablet 1  . amLODipine (NORVASC) 5 MG tablet Take 1 tablet (5 mg total) by mouth daily. 90 tablet 3  . anastrozole (ARIMIDEX) 1 MG tablet TAKE 1 TABLET(1 MG) BY MOUTH DAILY 90 tablet 0  . cholecalciferol (VITAMIN D) 1000 units tablet Take 1,000 Units by mouth daily.    Marland Kitchen levothyroxine (SYNTHROID, LEVOTHROID) 75 MCG tablet Take 1 tablet (75 mcg total) by mouth daily. 90 tablet 3  . rosuvastatin (CRESTOR) 10 MG tablet Take 1 tablet (10 mg total) by mouth daily. 90 tablet 3   No facility-administered medications prior to visit.     Allergies  Allergen Reactions  . Codeine Other (See Comments)    Reaction:  Severe headaches   . Penicillins Hives and Other (See Comments)    Has patient had a PCN reaction causing immediate rash, facial/tongue/throat swelling, SOB or lightheadedness with hypotension: No Has patient had a PCN reaction causing severe rash involving mucus membranes or skin necrosis: No Has patient had a PCN reaction that required hospitalization No Has patient had a PCN reaction occurring within the last 10 years: No If all of the above answers are "NO", then may proceed with Cephalosporin use.  . Adhesive [Tape] Rash    ROS Review of Systems  Constitutional: Negative for diaphoresis, fatigue, fever and unexpected weight change.  HENT: Positive for congestion, postnasal drip and rhinorrhea. Negative for sinus pressure, sinus pain and sore throat.   Eyes: Negative for photophobia and visual disturbance.   Respiratory: Positive for cough. Negative for shortness of breath and wheezing.   Cardiovascular: Negative.  Negative for chest pain and palpitations.  Gastrointestinal: Negative.   Genitourinary: Negative.   Musculoskeletal: Negative for gait problem and joint swelling.  Neurological: Negative.   Psychiatric/Behavioral: Negative.  Objective:    Physical Exam  Constitutional: She is oriented to person, place, and time. She appears well-developed and well-nourished. No distress.  HENT:  Head: Normocephalic and atraumatic.  Right Ear: External ear normal.  Left Ear: External ear normal.  Mouth/Throat: No oropharyngeal exudate.  Eyes: Pupils are equal, round, and reactive to light. Conjunctivae are normal. Right eye exhibits no discharge. Left eye exhibits no discharge. No scleral icterus.  Neck: Neck supple. No JVD present. No tracheal deviation present. No thyromegaly present.  Cardiovascular: Normal rate, regular rhythm and normal heart sounds.  Pulmonary/Chest: Effort normal and breath sounds normal. No stridor.  Lymphadenopathy:    She has no cervical adenopathy.  Neurological: She is alert and oriented to person, place, and time.  Skin: Skin is warm and dry. She is not diaphoretic.  Psychiatric: She has a normal mood and affect. Her behavior is normal.    BP 120/70   Pulse 100   Ht 5' (1.524 m)   Wt 131 lb 8 oz (59.6 kg)   SpO2 97%   BMI 25.68 kg/m  Wt Readings from Last 3 Encounters:  05/04/18 131 lb 8 oz (59.6 kg)  03/17/18 133 lb (60.3 kg)  03/04/18 132 lb 8 oz (60.1 kg)   BP Readings from Last 3 Encounters:  05/04/18 120/70  03/17/18 139/72  03/04/18 (!) 150/70   Guideline developer:  UpToDate (see UpToDate for funding source) Date Released: June 2014  There are no preventive care reminders to display for this patient.  There are no preventive care reminders to display for this patient.  Lab Results  Component Value Date   TSH 0.35 05/02/2018    Lab Results  Component Value Date   WBC 6.2 03/04/2018   HGB 13.6 03/04/2018   HCT 40.1 03/04/2018   MCV 92.8 03/04/2018   PLT 300.0 03/04/2018   Lab Results  Component Value Date   NA 142 03/04/2018   K 3.7 03/04/2018   CHLORIDE 108 05/05/2013   CO2 27 03/04/2018   GLUCOSE 100 (H) 03/04/2018   BUN 11 03/04/2018   CREATININE 0.93 03/04/2018   BILITOT 0.6 03/04/2018   ALKPHOS 51 03/04/2018   AST 23 03/04/2018   ALT 13 03/04/2018   PROT 7.3 03/04/2018   ALBUMIN 4.6 03/04/2018   CALCIUM 9.6 03/04/2018   ANIONGAP 10 05/18/2016   GFR 62.35 03/04/2018   Lab Results  Component Value Date   CHOL 187 03/04/2018   Lab Results  Component Value Date   HDL 75.00 03/04/2018   Lab Results  Component Value Date   LDLCALC 83 03/04/2018   Lab Results  Component Value Date   TRIG 144.0 03/04/2018   Lab Results  Component Value Date   CHOLHDL 2 03/04/2018   Lab Results  Component Value Date   HGBA1C 5.9 03/04/2018      Assessment & Plan:   Problem List Items Addressed This Visit      Other   PND (post-nasal drip) - Primary   Relevant Medications   fluticasone (FLONASE) 50 MCG/ACT nasal spray      Meds ordered this encounter  Medications  . fluticasone (FLONASE) 50 MCG/ACT nasal spray    Sig: Place 2 sprays into both nostrils daily.    Dispense:  16 g    Refill:  6    Follow-up: Return in about 4 months (around 09/03/2018).

## 2018-05-04 NOTE — Patient Instructions (Signed)
Postnasal Drip  Postnasal drip is the feeling of mucus going down the back of your throat. Mucus is a slimy substance that moistens and cleans your nose and throat, as well as the air pockets in face bones near your forehead and cheeks (sinuses). Small amounts of mucus pass from your nose and sinuses down the back of your throat all the time. This is normal. When you produce too much mucus or the mucus gets too thick, you can feel it.  Some common causes of postnasal drip include:   Having more mucus because of:  ? A cold or the flu.  ? Allergies.  ? Cold air.  ? Certain medicines.   Having more mucus that is thicker because of:  ? A sinus or nasal infection.  ? Dry air.  ? A food allergy.  Follow these instructions at home:  Relieving discomfort     Gargle with a salt-water mixture 3-4 times a day or as needed. To make a salt-water mixture, completely dissolve -1 tsp of salt in 1 cup of warm water.   If the air in your home is dry, use a humidifier to add moisture to the air.   Use a saline spray or container (neti pot) to flush out the nose (nasal irrigation). These methods can help clear away mucus and keep the nasal passages moist.  General instructions   Take over-the-counter and prescription medicines only as told by your health care provider.   Follow instructions from your health care provider about eating or drinking restrictions. You may need to avoid caffeine.   Avoid things that you know you are allergic to (allergens), like dust, mold, pollen, pets, or certain foods.   Drink enough fluid to keep your urine pale yellow.   Keep all follow-up visits as told by your health care provider. This is important.  Contact a health care provider if:   You have a fever.   You have a sore throat.   You have difficulty swallowing.   You have headache.   You have sinus pain.   You have a cough that does not go away.   The mucus from your nose becomes thick and is green or yellow in color.   You have  cold or flu symptoms that last more than 10 days.  Summary   Postnasal drip is the feeling of mucus going down the back of your throat.   If your health care provider approves, use nasal irrigation or a nasal spray 2?4 times a day.   Avoid things that you know you are allergic to (allergens), like dust, mold, pollen, pets, or certain foods.  This information is not intended to replace advice given to you by your health care provider. Make sure you discuss any questions you have with your health care provider.  Document Released: 05/25/2016 Document Revised: 05/25/2016 Document Reviewed: 05/25/2016  Elsevier Interactive Patient Education  2019 [employer]

## 2018-05-12 ENCOUNTER — Other Ambulatory Visit: Payer: Self-pay | Admitting: Internal Medicine

## 2018-05-12 DIAGNOSIS — C50311 Malignant neoplasm of lower-inner quadrant of right female breast: Secondary | ICD-10-CM

## 2018-06-27 ENCOUNTER — Telehealth: Payer: Self-pay

## 2018-06-27 NOTE — Telephone Encounter (Signed)
Spoke with patient to schedule overdue F/U with Dr. Rockey Situ. Offered telephone or video visit due to current clinic policies related to COVID 19 precautions. Patient declined stating that she prefers to have a physical exam in the office because she "has a small hole in her heart". Patient requested appt later this summer. Advised her that recall will be placed for 09/24/2018.

## 2018-07-15 ENCOUNTER — Inpatient Hospital Stay: Payer: Medicare HMO | Attending: Internal Medicine | Admitting: Internal Medicine

## 2018-07-15 ENCOUNTER — Telehealth: Payer: Self-pay | Admitting: *Deleted

## 2018-07-15 ENCOUNTER — Other Ambulatory Visit: Payer: Self-pay

## 2018-07-15 ENCOUNTER — Encounter: Payer: Self-pay | Admitting: Internal Medicine

## 2018-07-15 DIAGNOSIS — C50311 Malignant neoplasm of lower-inner quadrant of right female breast: Secondary | ICD-10-CM

## 2018-07-15 DIAGNOSIS — Z170 Estrogen receptor positive status [ER+]: Secondary | ICD-10-CM

## 2018-07-15 MED ORDER — ANASTROZOLE 1 MG PO TABS: 1.0000 mg | ORAL_TABLET | Freq: Every day | ORAL | 4 refills | Status: DC

## 2018-07-15 NOTE — Progress Notes (Signed)
Called patient today for Telehealth visit via [place].  Patient states she will need a refill on fosamax if she needs to continue.

## 2018-07-15 NOTE — Telephone Encounter (Signed)
Orders entered

## 2018-07-15 NOTE — Assessment & Plan Note (Addendum)
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  #Clinically no evidence of recurrence.  Stable.  Await mammogram in October 2020.  Continue as well.  Tolerating well without any major side effects.  #DEXA scan September 2018 osteopenia T score -1.8; continue Fosamax; clinically stable.  Continue the same for now.  Will repeat bone density in 1 year.  # DISPOSITION:  # follow up in 12 months-MD/ labs- cbc/cmp/ Dexa scan prior  # mammo in October 2020- Dr.B

## 2018-07-15 NOTE — Progress Notes (Signed)
I connected with [name] on 07/15/18 at  1:30 PM EDT by video enabled telemedicine visit and verified that I am speaking with the correct person using two identifiers.  I discussed the limitations, risks, security and privacy concerns of performing an evaluation and management service by telemedicine and the availability of in-person appointments. I also discussed with the patient that there may be a patient responsible charge related to this service. The patient expressed understanding and agreed to proceed.    Other persons participating in the visit and their role in the encounter: None Patient's location: Home Provider's location: Home  Oncology History   # 1994- LEFT BREAST CA s/p Lumpec; RT- Tam  # OCT 2013-  RIGHT BREAST CA pT1c [multifocal-1.2cm s/p Mastec] pN0 ER/PR-pos;Her 2Neu-NEG;Dec 2013- Arimidex; mammo-Sep 2017-N; BREAST CANCER INDEX- BENEFIT from extended AI.  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  female with history of ER PR positive HER-2- breast cancer currently on extended aromatase therapy.  Patient denies hot flashes.  Denies any bone pain.  Denies any new acute problems.  Denies any shortness of breath or cough.  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  #Clinically no evidence of  recurrence.  Stable.  Await mammogram in October 2020.  Continue as well.  Tolerating well without any major side effects.  #DEXA scan September 2018 osteopenia T score -1.8; continue Fosamax; clinically stable.  Continue the same for now.  Will repeat bone density in 1 year.  # DISPOSITION:  # follow up in 12 months-MD/ labs- cbc/cmp/ Dexa scan prior  # mammo in October 2020- Dr.B  Follow-up instructions:  I discussed the assessment and treatment plan with the patient.  The patient was provided an opportunity to ask questions and all were answered.  The patient agreed with the plan and demonstrated understanding of instructions.  The patient was advised to call back or seek an in person evaluation if the symptoms worsen or if the condition fails to improve as anticipated.  Dr. Charlaine Dalton [place] at [facility] 07/15/2018 2:03 PM

## 2018-07-21 ENCOUNTER — Other Ambulatory Visit: Payer: Self-pay | Admitting: Internal Medicine

## 2018-07-21 DIAGNOSIS — Z170 Estrogen receptor positive status [ER+]: Secondary | ICD-10-CM

## 2018-07-21 DIAGNOSIS — M8589 Other specified disorders of bone density and structure, multiple sites: Secondary | ICD-10-CM

## 2018-07-21 DIAGNOSIS — C50311 Malignant neoplasm of lower-inner quadrant of right female breast: Secondary | ICD-10-CM

## 2018-09-05 ENCOUNTER — Ambulatory Visit: Payer: Medicare HMO | Admitting: Family Medicine

## 2018-09-09 ENCOUNTER — Telehealth: Payer: Self-pay

## 2018-09-09 NOTE — Telephone Encounter (Signed)

## 2018-09-12 ENCOUNTER — Ambulatory Visit: Payer: Medicare HMO | Admitting: Family Medicine

## 2018-09-12 ENCOUNTER — Encounter: Payer: Self-pay | Admitting: Family Medicine

## 2018-09-12 VITALS — BP 136/70 | HR 97 | Ht 60.0 in | Wt 128.2 lb

## 2018-09-12 DIAGNOSIS — E039 Hypothyroidism, unspecified: Secondary | ICD-10-CM

## 2018-09-12 DIAGNOSIS — I10 Essential (primary) hypertension: Secondary | ICD-10-CM

## 2018-09-12 DIAGNOSIS — E559 Vitamin D deficiency, unspecified: Secondary | ICD-10-CM | POA: Diagnosis not present

## 2018-09-12 DIAGNOSIS — E7800 Pure hypercholesterolemia, unspecified: Secondary | ICD-10-CM

## 2018-09-12 LAB — CBC
HCT: 41.3 % (ref 36.0–46.0)
Hemoglobin: 13.7 g/dL (ref 12.0–15.0)
MCHC: 33.1 g/dL (ref 30.0–36.0)
MCV: 91.5 fl (ref 78.0–100.0)
Platelets: 283 10*3/uL (ref 150.0–400.0)
RBC: 4.51 Mil/uL (ref 3.87–5.11)
RDW: 13.3 % (ref 11.5–15.5)
WBC: 6.1 10*3/uL (ref 4.0–10.5)

## 2018-09-12 LAB — COMPREHENSIVE METABOLIC PANEL
ALT: 18 U/L (ref 0–35)
AST: 29 U/L (ref 0–37)
Albumin: 4.9 g/dL (ref 3.5–5.2)
Alkaline Phosphatase: 58 U/L (ref 39–117)
BUN: 12 mg/dL (ref 6–23)
CO2: 25 mEq/L (ref 19–32)
Calcium: 10.1 mg/dL (ref 8.4–10.5)
Chloride: 105 mEq/L (ref 96–112)
Creatinine, Ser: 1.04 mg/dL (ref 0.40–1.20)
GFR: 51.49 mL/min — ABNORMAL LOW (ref >60.00)
Glucose, Bld: 107 mg/dL — ABNORMAL HIGH (ref 70–99)
Potassium: 4.6 mEq/L (ref 3.5–5.1)
Sodium: 140 mEq/L (ref 135–145)
Total Bilirubin: 0.9 mg/dL (ref 0.2–1.2)
Total Protein: 7.7 g/dL (ref 6.0–8.3)

## 2018-09-12 LAB — LIPID PANEL
Cholesterol: 192 mg/dL (ref 0–200)
HDL: 75.6 mg/dL (ref >39.00)
LDL Cholesterol: 90 mg/dL (ref 0–99)
NonHDL: 116.51
Total CHOL/HDL Ratio: 3
Triglycerides: 132 mg/dL (ref 0.0–149.0)
VLDL: 26.4 mg/dL (ref 0.0–40.0)

## 2018-09-12 LAB — TSH: TSH: 0.17 u[IU]/mL — ABNORMAL LOW (ref 0.35–4.50)

## 2018-09-12 LAB — VITAMIN D 25 HYDROXY (VIT D DEFICIENCY, FRACTURES): VITD: 44 ng/mL (ref 30.00–100.00)

## 2018-09-12 NOTE — Addendum Note (Signed)
Addended by: Lynnea Ferrier on: 09/12/2018 11:08 AM   Modules accepted: Orders

## 2018-09-12 NOTE — Patient Instructions (Signed)

## 2018-09-12 NOTE — Progress Notes (Addendum)
Established Patient Office Visit  Subjective:  Patient ID: Madison Huerta, female    DOB: 1940/08/02  Age: 78 y.o. MRN: [id number]  CC:  Chief Complaint  Patient presents with  . Follow-up    HPI Madison Huerta presents for DEXA scan is through of her hypertension, elevated cholesterol and hypothyroidism.  Blood pressure is well controlled with the Norvasc there is no edema or other issues taking the medicine.  No problems taking the rosuvastatin.  She is taking her thyroid medicine on a fasting stomach each morning before eating.  She is exercising daily by walking.  Dental care is scheduled soon.  Eyecare is put on hold for now.  Follow-up DEXA scan is through oncology for now.  She is supplementing with vitamin D.  Past Medical History:  Diagnosis Date  . Breast cancer ([place]) 1994   L breast- lumpectomy, care for at [facility]   . Breast cancer [facility]) 2013   right breast- mastectomy, care at [facility]  . Cataract   . Endocarditis   . GERD (gastroesophageal reflux disease)   . Heart murmur    VSD- echocardiogram - [facility] 10 yrs. ago  . History of breast cancer   . Hyperlipidemia   . Hypertension   . Hypothyroidism   . Osteopenia   . Overweight(278.02)   . Personal history of radiation therapy 1994   left breast ca  . Thyroid disease    Hypothyroidism  . VSD (ventricular septal defect)    does need SBE prophylaxis (Keflex  3 gm 1 hour pprior and 1.5 gm 6 hours post  . VSD (ventricular septal defect)     Past Surgical History:  Procedure Laterality Date  . 2D echo  2004   [facility]  . AUGMENTATION MAMMAPLASTY Bilateral    removed 1994  . BREAST BIOPSY Left 1994   breast ca  . BREAST BIOPSY Right 2013   breast ca  . BREAST IMPLANT REMOVAL  1994  . BREAST LUMPECTOMY Left 1994   lumpectomy and rad tx and tamoxifen  . MASTECTOMY Right 01/08/2012   complete and anastrozole  . MASTECTOMY W/ SENTINEL NODE BIOPSY  01/08/2012   Procedure: MASTECTOMY WITH SENTINEL LYMPH NODE  BIOPSY;  Surgeon: Madilyn Hook, DO;  Location: [place];  Service: General;  Laterality: Right;  right breast mastectomy with sentinel lymph node biopsy   . PARATHYROIDECTOMY  12/2002  . TONSILLECTOMY     As a child  . TUBAL LIGATION     1976    Family History  Problem Relation Age of Onset  . Aneurysm Mother        brain  . Cancer Father        Lung, Liver mets  . Lung cancer Father 73  . Heart disease Other   . Cancer Cousin        Breast CA  . Breast cancer Cousin 30  . Breast cancer Maternal Aunt 66    Social History   Socioeconomic History  . Marital status: Married    Spouse name: Not on file  . Number of children: 2  . Years of education: Not on file  . Highest education level: Not on file  Occupational History  . Occupation: Animator: UNEMPLOYED  Social Needs  . Financial resource strain: Not on file  . Food insecurity    Worry: Not on file    Inability: Not on file  . Transportation needs    Medical:  Not on file    Non-medical: Not on file  Tobacco Use  . Smoking status: Never Smoker  . Smokeless tobacco: Never Used  Substance and Sexual Activity  . Alcohol use: Yes    Alcohol/week: 3.0 standard drinks    Types: 3 Glasses of wine per week  . Drug use: No  . Sexual activity: Never  Lifestyle  . Physical activity    Days per week: Not on file    Minutes per session: Not on file  . Stress: Not on file  Relationships  . Social Herbalist on phone: Not on file    Gets together: Not on file    Attends religious service: Not on file    Active member of club or organization: Not on file    Attends meetings of clubs or organizations: Not on file    Relationship status: Not on file  . Intimate partner violence    Fear of current or ex partner: Not on file    Emotionally abused: Not on file    Physically abused: Not on file    Forced sexual activity: Not on file  Other Topics Concern  . Not on file  Social History  Narrative  . Not on file    Outpatient Medications Prior to Visit  Medication Sig Dispense Refill  . acetaminophen (TYLENOL) 500 MG tablet Take 500 mg by mouth every 4 (four) hours as needed for fever.    Marland Kitchen alendronate (FOSAMAX) 70 MG tablet TAKE 1 TABLET BY MOUTH ONCE A WEEK, TAKE WITH FULL GLASS OF WATER ON AN EMPTY STOMACH 12 tablet 1  . amLODipine (NORVASC) 5 MG tablet Take 1 tablet (5 mg total) by mouth daily. 90 tablet 3  . anastrozole (ARIMIDEX) 1 MG tablet Take 1 tablet (1 mg total) by mouth daily. 90 tablet 4  . cholecalciferol (VITAMIN D) 1000 units tablet Take 1,000 Units by mouth daily.    . fluticasone (FLONASE) 50 MCG/ACT nasal spray Place 2 sprays into both nostrils daily. 16 g 6  . levothyroxine (SYNTHROID, LEVOTHROID) 75 MCG tablet Take 1 tablet (75 mcg total) by mouth daily. 90 tablet 3  . rosuvastatin (CRESTOR) 10 MG tablet Take 1 tablet (10 mg total) by mouth daily. 90 tablet 3   No facility-administered medications prior to visit.     Allergies  Allergen Reactions  . Codeine Other (See Comments)    Reaction:  Severe headaches   . Penicillins Hives and Other (See Comments)    Has patient had a PCN reaction causing immediate rash, facial/tongue/throat swelling, SOB or lightheadedness with hypotension: No Has patient had a PCN reaction causing severe rash involving mucus membranes or skin necrosis: No Has patient had a PCN reaction that required hospitalization No Has patient had a PCN reaction occurring within the last 10 years: No If all of the above answers are "NO", then may proceed with Cephalosporin use.  . Adhesive [Tape] Rash    ROS Review of Systems  Constitutional: Negative.   HENT: Negative.   Eyes: Negative for photophobia and visual disturbance.  Respiratory: Negative.   Cardiovascular: Negative.   Gastrointestinal: Negative.   Endocrine: Negative for polyphagia and polyuria.  Genitourinary: Negative.   Musculoskeletal: Negative.   Skin:  Negative for pallor and rash.  Allergic/Immunologic: Negative for immunocompromised state.  Neurological: Negative for seizures and weakness.  Hematological: Does not bruise/bleed easily.  Psychiatric/Behavioral: Negative.       Objective:    Physical Exam  Constitutional: She is oriented to person, place, and time. She appears well-developed and well-nourished. No distress.  HENT:  Head: Normocephalic and atraumatic.  Right Ear: External ear normal.  Left Ear: External ear normal.  Mouth/Throat: Oropharynx is clear and moist. No oropharyngeal exudate.  Eyes: Pupils are equal, round, and reactive to light. Conjunctivae are normal. Right eye exhibits no discharge. Left eye exhibits no discharge. No scleral icterus.  Neck: Neck supple. No JVD present. No tracheal deviation present. No thyromegaly present.  Cardiovascular: Normal rate, regular rhythm and normal heart sounds.  Pulmonary/Chest: Effort normal and breath sounds normal. No stridor.  Abdominal: Bowel sounds are normal.  Musculoskeletal:        General: No edema.  Lymphadenopathy:    She has no cervical adenopathy.  Neurological: She is alert and oriented to person, place, and time.  Skin: Skin is warm and dry. She is not diaphoretic.  Psychiatric: She has a normal mood and affect. Her behavior is normal.    BP 136/70   Pulse 97   Ht 5' (1.524 m)   Wt 128 lb 4 oz (58.2 kg)   SpO2 96%   BMI 25.05 kg/m  Wt Readings from Last 3 Encounters:  09/12/18 128 lb 4 oz (58.2 kg)  05/04/18 131 lb 8 oz (59.6 kg)  03/17/18 133 lb (60.3 kg)   BP Readings from Last 3 Encounters:  09/12/18 136/70  05/04/18 120/70  03/17/18 139/72   Guideline developer:  UpToDate (see UpToDate for funding source) Date Released: June 2014  There are no preventive care reminders to display for this patient.  There are no preventive care reminders to display for this patient.  Lab Results  Component Value Date   TSH 0.17 (L) 09/12/2018    Lab Results  Component Value Date   WBC 6.1 09/12/2018   HGB 13.7 09/12/2018   HCT 41.3 09/12/2018   MCV 91.5 09/12/2018   PLT 283.0 09/12/2018   Lab Results  Component Value Date   NA 140 09/12/2018   K 4.6 09/12/2018   CHLORIDE 108 05/05/2013   CO2 25 09/12/2018   GLUCOSE 107 (H) 09/12/2018   BUN 12 09/12/2018   CREATININE 1.04 09/12/2018   BILITOT 0.9 09/12/2018   ALKPHOS 58 09/12/2018   AST 29 09/12/2018   ALT 18 09/12/2018   PROT 7.7 09/12/2018   ALBUMIN 4.9 09/12/2018   CALCIUM 10.1 09/12/2018   ANIONGAP 10 05/18/2016   GFR 51.49 (L) 09/12/2018   Lab Results  Component Value Date   CHOL 192 09/12/2018   Lab Results  Component Value Date   HDL 75.60 09/12/2018   Lab Results  Component Value Date   LDLCALC 90 09/12/2018   Lab Results  Component Value Date   TRIG 132.0 09/12/2018   Lab Results  Component Value Date   CHOLHDL 3 09/12/2018   Lab Results  Component Value Date   HGBA1C 5.9 03/04/2018      Assessment & Plan:   Problem List Items Addressed This Visit      Cardiovascular and Mediastinum   Essential hypertension - Primary   Relevant Orders   CBC (Completed)   Comprehensive metabolic panel (Completed)   Urinalysis, Routine w reflex microscopic     Endocrine   Hypothyroidism   Relevant Orders   TSH (Completed)   Urinalysis, Routine w reflex microscopic     Other   Vitamin D deficiency   Relevant Orders   VITAMIN D 25 Hydroxy (Vit-D Deficiency, Fractures) (Completed)  Urinalysis, Routine w reflex microscopic   Hyperlipidemia   Relevant Orders   Lipid panel (Completed)   Urinalysis, Routine w reflex microscopic      No orders of the defined types were placed in this encounter.   Follow-up: Return in about 3 months (around 12/13/2018).    Patient was given information on bone health reminded to take calcium along with her vitamin D

## 2018-09-12 NOTE — Addendum Note (Signed)
Addended by: Lynnea Ferrier on: 09/12/2018 11:09 AM   Modules accepted: Orders

## 2018-09-13 ENCOUNTER — Other Ambulatory Visit (INDEPENDENT_AMBULATORY_CARE_PROVIDER_SITE_OTHER): Payer: Medicare HMO

## 2018-09-13 DIAGNOSIS — I10 Essential (primary) hypertension: Secondary | ICD-10-CM | POA: Diagnosis not present

## 2018-09-13 LAB — URINALYSIS, ROUTINE W REFLEX MICROSCOPIC
Bilirubin Urine: NEGATIVE
Ketones, ur: NEGATIVE
Leukocytes,Ua: NEGATIVE
Nitrite: NEGATIVE
Specific Gravity, Urine: <=1.005 — AB (ref 1.000–1.030)
Total Protein, Urine: NEGATIVE
Urine Glucose: NEGATIVE
Urobilinogen, UA: 0.2 (ref 0.0–1.0)
pH: 6.5 (ref 5.0–8.0)

## 2018-09-13 NOTE — Addendum Note (Signed)
Addended by: Lynnea Ferrier on: 09/13/2018 10:14 AM   Modules accepted: Orders

## 2018-09-23 ENCOUNTER — Telehealth: Payer: Self-pay

## 2018-09-23 NOTE — Telephone Encounter (Signed)

## 2018-09-26 ENCOUNTER — Other Ambulatory Visit (INDEPENDENT_AMBULATORY_CARE_PROVIDER_SITE_OTHER): Payer: Medicare HMO

## 2018-09-26 DIAGNOSIS — I10 Essential (primary) hypertension: Secondary | ICD-10-CM

## 2018-09-26 LAB — URINALYSIS, ROUTINE W REFLEX MICROSCOPIC
Leukocytes,Ua: NEGATIVE
Nitrite: NEGATIVE
Specific Gravity, Urine: 1.015 (ref 1.000–1.030)
Total Protein, Urine: NEGATIVE
Urine Glucose: NEGATIVE
Urobilinogen, UA: 0.2 (ref 0.0–1.0)
pH: 6.5 (ref 5.0–8.0)

## 2018-10-04 ENCOUNTER — Encounter: Payer: Self-pay | Admitting: Family Medicine

## 2018-10-05 NOTE — Telephone Encounter (Signed)
Pt scheduled for 10/17/18 at 10

## 2018-10-14 ENCOUNTER — Telehealth: Payer: Self-pay

## 2018-10-14 NOTE — Telephone Encounter (Signed)

## 2018-10-15 DIAGNOSIS — R69 Illness, unspecified: Secondary | ICD-10-CM | POA: Diagnosis not present

## 2018-10-17 ENCOUNTER — Ambulatory Visit: Payer: Medicare HMO | Admitting: Family Medicine

## 2018-10-17 ENCOUNTER — Other Ambulatory Visit: Payer: Self-pay

## 2018-10-17 ENCOUNTER — Encounter: Payer: Self-pay | Admitting: Family Medicine

## 2018-10-17 VITALS — BP 120/70 | HR 82 | Ht 60.0 in

## 2018-10-17 DIAGNOSIS — T7841XA Arthus phenomenon, initial encounter: Secondary | ICD-10-CM | POA: Diagnosis not present

## 2018-10-17 DIAGNOSIS — R319 Hematuria, unspecified: Secondary | ICD-10-CM

## 2018-10-17 NOTE — Progress Notes (Addendum)
Established Patient Office Visit  Subjective:  Patient ID: Madison Huerta, female    DOB: 1940/10/08  Age: 78 y.o. MRN: [id number]  CC:  Chief Complaint  Patient presents with  . Follow-up    HPI [name] presents for follow-up of her hematuria.  There is no visible blood in her urine stool or sputum.  She has no melena.  Denies weight loss.  She never smoked.  Menopause was many years ago when she is never experienced vaginal bleeding since.  She does have hemorrhoids.  She was exposed to copious tobacco smoke growing up.  She has no history of renal lithiasis.  History of breast cancer.  She had a flu vaccine on Friday and has had some redness in her arm status post vaccine.  There is been no fever or chills or pain at the injection site.  Past Medical History:  Diagnosis Date  . Breast cancer ([place]) 1994   L breast- lumpectomy, care [facility] at [facility]   . Breast cancer [place]) 2013   right breast- mastectomy, care at [facility]  . Cataract   . Endocarditis   . GERD (gastroesophageal reflux disease)   . Heart murmur    VSD- echocardiogram - [facility] 10 yrs. ago  . History of breast cancer   . Hyperlipidemia   . Hypertension   . Hypothyroidism   . Osteopenia   . Overweight(278.02)   . Personal history of radiation therapy 1994   left breast ca  . Thyroid disease    Hypothyroidism  . VSD (ventricular septal defect)    does need SBE prophylaxis (Keflex  3 gm 1 hour pprior and 1.5 gm 6 hours post  . VSD (ventricular septal defect)     Past Surgical History:  Procedure Laterality Date  . 2D echo  2004   Normal at [facility]  . AUGMENTATION MAMMAPLASTY Bilateral    removed 1994  . BREAST BIOPSY Left 1994   breast ca  . BREAST BIOPSY Right 2013   breast ca  . BREAST IMPLANT REMOVAL  1994  . BREAST LUMPECTOMY Left 1994   lumpectomy and rad tx and tamoxifen  . MASTECTOMY Right 01/08/2012   complete and anastrozole  . MASTECTOMY W/ SENTINEL NODE BIOPSY  01/08/2012   Procedure:  MASTECTOMY WITH SENTINEL LYMPH NODE BIOPSY;  Surgeon: Madilyn Hook, DO;  Location: [place];  Service: General;  Laterality: Right;  right breast mastectomy with sentinel lymph node biopsy   . PARATHYROIDECTOMY  12/2002  . TONSILLECTOMY     As a child  . TUBAL LIGATION     1976    Family History  Problem Relation Age of Onset  . Aneurysm Mother        brain  . Cancer Father        Lung, Liver mets  . Lung cancer Father 68  . Heart disease Other   . Cancer Cousin        Breast CA  . Breast cancer Cousin 30  . Breast cancer Maternal Aunt 7    Social History   Socioeconomic History  . Marital status: Married    Spouse name: Not on file  . Number of children: 2  . Years of education: Not on file  . Highest education level: Not on file  Occupational History  . Occupation: Animator: UNEMPLOYED  Social Needs  . Financial resource strain: Not on file  . Food insecurity    Worry: Not on  file    Inability: Not on file  . Transportation needs    Medical: Not on file    Non-medical: Not on file  Tobacco Use  . Smoking status: Never Smoker  . Smokeless tobacco: Never Used  Substance and Sexual Activity  . Alcohol use: Yes    Alcohol/week: 3.0 standard drinks    Types: 3 Glasses of wine per week  . Drug use: No  . Sexual activity: Never  Lifestyle  . Physical activity    Days per week: Not on file    Minutes per session: Not on file  . Stress: Not on file  Relationships  . Social Herbalist on phone: Not on file    Gets together: Not on file    Attends religious service: Not on file    Active member of club or organization: Not on file    Attends meetings of clubs or organizations: Not on file    Relationship status: Not on file  . Intimate partner violence    Fear of current or ex partner: Not on file    Emotionally abused: Not on file    Physically abused: Not on file    Forced sexual activity: Not on file  Other Topics Concern  .  Not on file  Social History Narrative  . Not on file    Outpatient Medications Prior to Visit  Medication Sig Dispense Refill  . acetaminophen (TYLENOL) 500 MG tablet Take 500 mg by mouth every 4 (four) hours as needed for fever.    Marland Kitchen alendronate (FOSAMAX) 70 MG tablet TAKE 1 TABLET BY MOUTH ONCE A WEEK, TAKE WITH FULL GLASS OF WATER ON AN EMPTY STOMACH 12 tablet 1  . amLODipine (NORVASC) 5 MG tablet Take 1 tablet (5 mg total) by mouth daily. 90 tablet 3  . anastrozole (ARIMIDEX) 1 MG tablet Take 1 tablet (1 mg total) by mouth daily. 90 tablet 4  . cholecalciferol (VITAMIN D) 1000 units tablet Take 1,000 Units by mouth daily.    . fluticasone (FLONASE) 50 MCG/ACT nasal spray Place 2 sprays into both nostrils daily. 16 g 6  . levothyroxine (SYNTHROID, LEVOTHROID) 75 MCG tablet Take 1 tablet (75 mcg total) by mouth daily. 90 tablet 3  . rosuvastatin (CRESTOR) 10 MG tablet Take 1 tablet (10 mg total) by mouth daily. 90 tablet 3   No facility-administered medications prior to visit.     Allergies  Allergen Reactions  . Codeine Other (See Comments)    Reaction:  Severe headaches   . Penicillins Hives and Other (See Comments)    Has patient had a PCN reaction causing immediate rash, facial/tongue/throat swelling, SOB or lightheadedness with hypotension: No Has patient had a PCN reaction causing severe rash involving mucus membranes or skin necrosis: No Has patient had a PCN reaction that required hospitalization No Has patient had a PCN reaction occurring within the last 10 years: No If all of the above answers are "NO", then may proceed with Cephalosporin use.  . Adhesive [Tape] Rash    ROS Review of Systems  Constitutional: Negative.   HENT: Negative.   Eyes: Negative for photophobia and visual disturbance.  Respiratory: Negative.   Cardiovascular: Negative.   Gastrointestinal: Negative.  Negative for anal bleeding and blood in stool.  Endocrine: Negative for polyphagia and  polyuria.  Genitourinary: Negative for difficulty urinating, dysuria, frequency, genital sores, hematuria, menstrual problem, pelvic pain, urgency, vaginal bleeding, vaginal discharge and vaginal pain.  Musculoskeletal: Negative  for myalgias.  Allergic/Immunologic: Negative for immunocompromised state.  Neurological: Negative for light-headedness.  Hematological: Does not bruise/bleed easily.  Psychiatric/Behavioral: Negative.       Objective:    Physical Exam  Constitutional: She is oriented to person, place, and time. She appears well-developed and well-nourished. No distress.  HENT:  Head: Normocephalic and atraumatic.  Right Ear: External ear normal.  Left Ear: External ear normal.  Eyes: Conjunctivae are normal. Right eye exhibits no discharge. Left eye exhibits no discharge. No scleral icterus.  Neck: No JVD present. No tracheal deviation present.  Pulmonary/Chest: Effort normal. No stridor.  Neurological: She is alert and oriented to person, place, and time.  Skin: Skin is warm and dry. She is not diaphoretic.     Psychiatric: She has a normal mood and affect. Her behavior is normal.    BP 120/70   Pulse 82   Ht 5' (1.524 m)   SpO2 98%   BMI 25.05 kg/m  Wt Readings from Last 3 Encounters:  09/12/18 128 lb 4 oz (58.2 kg)  05/04/18 131 lb 8 oz (59.6 kg)  03/17/18 133 lb (60.3 kg)   BP Readings from Last 3 Encounters:  10/17/18 120/70  09/12/18 136/70  05/04/18 120/70   Guideline developer:  UpToDate (see UpToDate for funding source) Date Released: June 2014  There are no preventive care reminders to display for this patient.  There are no preventive care reminders to display for this patient.  Lab Results  Component Value Date   TSH 0.17 (L) 09/12/2018   Lab Results  Component Value Date   WBC 6.1 09/12/2018   HGB 13.7 09/12/2018   HCT 41.3 09/12/2018   MCV 91.5 09/12/2018   PLT 283.0 09/12/2018   Lab Results  Component Value Date   NA 140  09/12/2018   K 4.6 09/12/2018   CHLORIDE 108 05/05/2013   CO2 25 09/12/2018   GLUCOSE 107 (H) 09/12/2018   BUN 12 09/12/2018   CREATININE 1.04 09/12/2018   BILITOT 0.9 09/12/2018   ALKPHOS 58 09/12/2018   AST 29 09/12/2018   ALT 18 09/12/2018   PROT 7.7 09/12/2018   ALBUMIN 4.9 09/12/2018   CALCIUM 10.1 09/12/2018   ANIONGAP 10 05/18/2016   GFR 51.49 (L) 09/12/2018   Lab Results  Component Value Date   CHOL 192 09/12/2018   Lab Results  Component Value Date   HDL 75.60 09/12/2018   Lab Results  Component Value Date   LDLCALC 90 09/12/2018   Lab Results  Component Value Date   TRIG 132.0 09/12/2018   Lab Results  Component Value Date   CHOLHDL 3 09/12/2018   Lab Results  Component Value Date   HGBA1C 5.9 03/04/2018      Assessment & Plan:   Problem List Items Addressed This Visit      Musculoskeletal and Integument   Arthus phenomenon     Other   Hematuria - Primary   Relevant Orders   Ambulatory referral to Urology      No orders of the defined types were placed in this encounter.   Follow-up: Return if symptoms worsen or fail to improve.

## 2018-10-17 NOTE — Patient Instructions (Signed)
Hematuria, Adult Hematuria is blood in the urine. Blood may be visible in the urine, or it may be identified with a test. This condition can be caused by infections of the bladder, urethra, kidney, or prostate. Other possible causes include:  Kidney stones.  Cancer of the urinary tract.  Too much calcium in the urine.  Conditions that are passed from parent to child (inherited conditions).  Exercise that requires a lot of energy. Infections can usually be treated with medicine, and a kidney stone usually will pass through your urine. If neither of these is the cause of your hematuria, more tests may be needed to identify the cause of your symptoms. It is very important to tell your health care provider about any blood in your urine, even if it is painless or the blood stops without treatment. Blood in the urine, when it happens and then stops and then happens again, can be a symptom of a very serious condition, including cancer. There is no pain in the initial stages of many urinary cancers. Follow these instructions at home: Medicines  Take over-the-counter and prescription medicines only as told by your health care provider.  If you were prescribed an antibiotic medicine, take it as told by your health care provider. Do not stop taking the antibiotic even if you start to feel better. Eating and drinking  Drink enough fluid to keep your urine clear or pale yellow. It is recommended that you drink 3-4 quarts (2.8-3.8 L) a day. If you have been diagnosed with an infection, it is recommended that you drink cranberry juice in addition to large amounts of water.  Avoid caffeine, tea, and carbonated beverages. These tend to irritate the bladder.  Avoid alcohol because it may irritate the prostate (men). General instructions  If you have been diagnosed with a kidney stone, follow your health care provider's instructions about straining your urine to catch the stone.  Empty your bladder  often. Avoid holding urine for long periods of time.  If you are female: ? After a bowel movement, wipe from front to back and use each piece of toilet paper only once. ? Empty your bladder before and after sex.  Pay attention to any changes in your symptoms. Tell your health care provider about any changes or any new symptoms.  It is your responsibility to get your test results. Ask your health care provider, or the department performing the test, when your results will be ready.  Keep all follow-up visits as told by your health care provider. This is important. Contact a health care provider if:  You develop back pain.  You have a fever.  You have nausea or vomiting.  Your symptoms do not improve after 3 days.  Your symptoms get worse. Get help right away if:  You develop severe vomiting and are unable take medicine without vomiting.  You develop severe pain in your back or abdomen even though you are taking medicine.  You pass a large amount of blood in your urine.  You pass blood clots in your urine.  You feel very weak or like you might faint.  You faint. Summary  Hematuria is blood in the urine. It has many possible causes.  It is very important that you tell your health care provider about any blood in your urine, even if it is painless or the blood stops without treatment.  Take over-the-counter and prescription medicines only as told by your health care provider.  Drink enough fluid to keep   your urine clear or pale yellow. This information is not intended to replace advice given to you by your health care provider. Make sure you discuss any questions you have with your health care provider. Document Released: 02/09/2005 Document Revised: 01/22/2017 Document Reviewed: 03/14/2016 Elsevier Patient Education  2020 [employer]  

## 2018-11-18 DIAGNOSIS — R311 Benign essential microscopic hematuria: Secondary | ICD-10-CM | POA: Diagnosis not present

## 2018-11-28 DIAGNOSIS — R3129 Other microscopic hematuria: Secondary | ICD-10-CM | POA: Diagnosis not present

## 2018-11-28 DIAGNOSIS — R311 Benign essential microscopic hematuria: Secondary | ICD-10-CM | POA: Diagnosis not present

## 2018-12-05 ENCOUNTER — Ambulatory Visit
Admission: RE | Admit: 2018-12-05 | Discharge: 2018-12-05 | Disposition: A | Discharge status: Home / Self Care | Payer: Medicare HMO | Source: Ambulatory Visit | Attending: Internal Medicine | Admitting: Internal Medicine

## 2018-12-05 DIAGNOSIS — Z1231 Encounter for screening mammogram for malignant neoplasm of breast: Secondary | ICD-10-CM | POA: Diagnosis not present

## 2018-12-05 DIAGNOSIS — Z853 Personal history of malignant neoplasm of breast: Secondary | ICD-10-CM | POA: Insufficient documentation

## 2018-12-05 DIAGNOSIS — Z170 Estrogen receptor positive status [ER+]: Secondary | ICD-10-CM

## 2018-12-05 DIAGNOSIS — M8589 Other specified disorders of bone density and structure, multiple sites: Secondary | ICD-10-CM | POA: Insufficient documentation

## 2018-12-05 DIAGNOSIS — C50311 Malignant neoplasm of lower-inner quadrant of right female breast: Secondary | ICD-10-CM

## 2018-12-05 DIAGNOSIS — Z9011 Acquired absence of right breast and nipple: Secondary | ICD-10-CM | POA: Insufficient documentation

## 2018-12-05 DIAGNOSIS — Z780 Asymptomatic menopausal state: Secondary | ICD-10-CM | POA: Diagnosis not present

## 2018-12-10 ENCOUNTER — Other Ambulatory Visit: Payer: Self-pay | Admitting: Family Medicine

## 2018-12-12 ENCOUNTER — Telehealth: Payer: Self-pay

## 2018-12-12 ENCOUNTER — Other Ambulatory Visit: Payer: Self-pay

## 2018-12-12 ENCOUNTER — Encounter: Payer: Self-pay | Admitting: Family Medicine

## 2018-12-12 DIAGNOSIS — E039 Hypothyroidism, unspecified: Secondary | ICD-10-CM

## 2018-12-12 NOTE — Telephone Encounter (Signed)
msg sent to pt

## 2018-12-12 NOTE — Telephone Encounter (Signed)
uring this illness, did/does the patient experience any of the following symptoms? Fever >100.4F []  Yes [x]  No []  Unknown Subjective fever (felt feverish) []  Yes [x]  No []  Unknown Chills []  Yes [x]  No []  Unknown Muscle aches (myalgia) []  Yes [x]  No []  Unknown Runny nose (rhinorrhea) []  Yes [x]  No []  Unknown Sore throat []  Yes [x]  No []  Unknown Cough (new onset or worsening of chronic cough) []  Yes [x]  No []  Unknown Shortness of breath (dyspnea) []  Yes [x]  No []  Unknown Nausea or vomiting []  Yes [x]  No []  Unknown Headache []  Yes [x]  No []  Unknown Abdominal pain  []  Yes [x]  No []  Unknown Diarrhea (?3 loose/looser than normal stools/24hr period) []  Yes [x]  No []  Unknown Other, specify:   

## 2018-12-12 NOTE — Telephone Encounter (Signed)
Pt coming in on 10/22.

## 2018-12-12 NOTE — Telephone Encounter (Signed)
Needs TSH drawn. It was abnormal last visit and I need to check it again.

## 2018-12-13 ENCOUNTER — Other Ambulatory Visit (INDEPENDENT_AMBULATORY_CARE_PROVIDER_SITE_OTHER): Payer: Medicare HMO

## 2018-12-13 DIAGNOSIS — E039 Hypothyroidism, unspecified: Secondary | ICD-10-CM | POA: Diagnosis not present

## 2018-12-13 LAB — TSH: TSH: 0.33 u[IU]/mL — ABNORMAL LOW (ref 0.35–4.50)

## 2018-12-14 ENCOUNTER — Ambulatory Visit: Payer: Medicare HMO | Admitting: Family Medicine

## 2018-12-14 ENCOUNTER — Telehealth: Payer: Self-pay

## 2018-12-14 NOTE — Telephone Encounter (Signed)

## 2018-12-15 ENCOUNTER — Ambulatory Visit: Payer: Medicare HMO | Admitting: Family Medicine

## 2018-12-15 ENCOUNTER — Encounter: Payer: Self-pay | Admitting: Family Medicine

## 2018-12-15 ENCOUNTER — Other Ambulatory Visit: Payer: Self-pay

## 2018-12-15 VITALS — BP 130/76 | HR 84 | Temp 97.9°F | Ht 60.0 in | Wt 129.0 lb

## 2018-12-15 DIAGNOSIS — I10 Essential (primary) hypertension: Secondary | ICD-10-CM | POA: Diagnosis not present

## 2018-12-15 DIAGNOSIS — E039 Hypothyroidism, unspecified: Secondary | ICD-10-CM

## 2018-12-15 DIAGNOSIS — R944 Abnormal results of kidney function studies: Secondary | ICD-10-CM | POA: Diagnosis not present

## 2018-12-15 NOTE — Progress Notes (Signed)
Established Patient Office Visit  Subjective:  Patient ID: Madison Huerta, female    DOB: 1941/01/05  Age: 78 y.o. MRN: [id number]  CC:  Chief Complaint  Patient presents with  . Follow-up    HPI Madison Huerta presents for follow-up of her hypothyroidism, decreased GFR, and hypertension.  Patient's blood pressure is well controlled.  Glucose mildly elevated but patient at low risk for diabetes.  Patient has been making a point to hydrate well.  TSH increased some but still is on the low side.  Have further decreased her levothyroxine.  She is status post work-up by urology.  She tells me she had a CT scanning of her abdomen and pelvis that was normal.  Past Medical History:  Diagnosis Date  . Breast cancer ([place]) 1994   L breast- lumpectomy, care for at [facility]   . Breast cancer [facility]) 2013   right breast- mastectomy, care at [facility]  . Cataract   . Endocarditis   . GERD (gastroesophageal reflux disease)   . Heart murmur    VSD- echocardiogram - [facility] 10 yrs. ago  . History of breast cancer   . Hyperlipidemia   . Hypertension   . Hypothyroidism   . Osteopenia   . Overweight(278.02)   . Personal history of radiation therapy 1994   left breast ca  . Thyroid disease    Hypothyroidism  . VSD (ventricular septal defect)    does need SBE prophylaxis (Keflex  3 gm 1 hour pprior and 1.5 gm 6 hours post  . VSD (ventricular septal defect)     Past Surgical History:  Procedure Laterality Date  . 2D echo  2004   Normal at [facility]  . AUGMENTATION MAMMAPLASTY Bilateral    removed 1994  . BREAST BIOPSY Left 1994   breast ca  . BREAST BIOPSY Right 2013   breast ca  . BREAST IMPLANT REMOVAL  1994  . BREAST LUMPECTOMY Left 1994   lumpectomy and rad tx and tamoxifen  . MASTECTOMY Right 01/08/2012   complete and anastrozole  . MASTECTOMY W/ SENTINEL NODE BIOPSY  01/08/2012   Procedure: MASTECTOMY WITH SENTINEL LYMPH NODE BIOPSY;  Surgeon: Madilyn Hook, DO;  Location: [place];  Service:  General;  Laterality: Right;  right breast mastectomy with sentinel lymph node biopsy   . PARATHYROIDECTOMY  12/2002  . TONSILLECTOMY     As a child  . TUBAL LIGATION     1976    Family History  Problem Relation Age of Onset  . Aneurysm Mother        brain  . Cancer Father        Lung, Liver mets  . Lung cancer Father 19  . Heart disease Other   . Cancer Cousin        Breast CA  . Breast cancer Cousin 30  . Breast cancer Maternal Aunt 24    Social History   Socioeconomic History  . Marital status: Married    Spouse name: Not on file  . Number of children: 2  . Years of education: Not on file  . Highest education level: Not on file  Occupational History  . Occupation: Animator: UNEMPLOYED  Social Needs  . Financial resource strain: Not on file  . Food insecurity    Worry: Not on file    Inability: Not on file  . Transportation needs    Medical: Not on file    Non-medical: Not on file  Tobacco Use  . Smoking status: Never Smoker  . Smokeless tobacco: Never Used  Substance and Sexual Activity  . Alcohol use: Yes    Alcohol/week: 3.0 standard drinks    Types: 3 Glasses of wine per week  . Drug use: No  . Sexual activity: Never  Lifestyle  . Physical activity    Days per week: Not on file    Minutes per session: Not on file  . Stress: Not on file  Relationships  . Social Herbalist on phone: Not on file    Gets together: Not on file    Attends religious service: Not on file    Active member of club or organization: Not on file    Attends meetings of clubs or organizations: Not on file    Relationship status: Not on file  . Intimate partner violence    Fear of current or ex partner: Not on file    Emotionally abused: Not on file    Physically abused: Not on file    Forced sexual activity: Not on file  Other Topics Concern  . Not on file  Social History Narrative  . Not on file    Outpatient Medications Prior to Visit   Medication Sig Dispense Refill  . acetaminophen (TYLENOL) 500 MG tablet Take 500 mg by mouth every 4 (four) hours as needed for fever.    Marland Kitchen alendronate (FOSAMAX) 70 MG tablet TAKE 1 TABLET BY MOUTH ONCE A WEEK, TAKE WITH FULL GLASS OF WATER ON AN EMPTY STOMACH 12 tablet 1  . amLODipine (NORVASC) 5 MG tablet Take 1 tablet (5 mg total) by mouth daily. 90 tablet 3  . anastrozole (ARIMIDEX) 1 MG tablet Take 1 tablet (1 mg total) by mouth daily. 90 tablet 4  . cholecalciferol (VITAMIN D) 1000 units tablet Take 1,000 Units by mouth daily.    . fluticasone (FLONASE) 50 MCG/ACT nasal spray Place 2 sprays into both nostrils daily. 16 g 6  . levothyroxine (SYNTHROID) 50 MCG tablet Take 1 tablet (50 mcg total) by mouth daily. 90 tablet 1  . rosuvastatin (CRESTOR) 10 MG tablet Take 1 tablet (10 mg total) by mouth daily. 90 tablet 3   No facility-administered medications prior to visit.     Allergies  Allergen Reactions  . Codeine Other (See Comments)    Reaction:  Severe headaches   . Penicillins Hives and Other (See Comments)    Has patient had a PCN reaction causing immediate rash, facial/tongue/throat swelling, SOB or lightheadedness with hypotension: No Has patient had a PCN reaction causing severe rash involving mucus membranes or skin necrosis: No Has patient had a PCN reaction that required hospitalization No Has patient had a PCN reaction occurring within the last 10 years: No If all of the above answers are "NO", then may proceed with Cephalosporin use.  . Adhesive [Tape] Rash    ROS Review of Systems  Constitutional: Negative.   HENT: Negative.   Eyes: Negative for photophobia and visual disturbance.  Respiratory: Negative.   Cardiovascular: Negative.   Gastrointestinal: Negative.   Endocrine: Negative for polyphagia and polyuria.  Genitourinary: Negative for difficulty urinating, frequency and urgency.  Musculoskeletal: Negative for gait problem and joint swelling.  Neurological:  Negative for seizures and speech difficulty.  Hematological: Negative.   Psychiatric/Behavioral: Negative.       Objective:    Physical Exam  Constitutional: She is oriented to person, place, and time. She appears well-developed and well-nourished. No distress.  HENT:  Head: Normocephalic and atraumatic.  Right Ear: External ear normal.  Left Ear: External ear normal.  Eyes: Conjunctivae are normal. Right eye exhibits no discharge. Left eye exhibits no discharge. No scleral icterus.  Neck: No JVD present. No tracheal deviation present.  Pulmonary/Chest: Effort normal. No stridor.  Neurological: She is alert and oriented to person, place, and time.  Skin: She is not diaphoretic.  Psychiatric: She has a normal mood and affect. Her behavior is normal.    BP 130/76   Pulse 84   Temp 97.9 F (36.6 C) (Tympanic)   Ht 5' (1.524 m)   Wt 129 lb (58.5 kg)   SpO2 98%   BMI 25.19 kg/m  Wt Readings from Last 3 Encounters:  12/15/18 129 lb (58.5 kg)  09/12/18 128 lb 4 oz (58.2 kg)  05/04/18 131 lb 8 oz (59.6 kg)   BP Readings from Last 3 Encounters:  12/15/18 130/76  10/17/18 120/70  09/12/18 136/70   Guideline developer:  UpToDate (see UpToDate for funding source) Date Released: June 2014  There are no preventive care reminders to display for this patient.  There are no preventive care reminders to display for this patient.  Lab Results  Component Value Date   TSH 0.33 (L) 12/13/2018   Lab Results  Component Value Date   WBC 6.1 09/12/2018   HGB 13.7 09/12/2018   HCT 41.3 09/12/2018   MCV 91.5 09/12/2018   PLT 283.0 09/12/2018   Lab Results  Component Value Date   NA 140 09/12/2018   K 4.6 09/12/2018   CHLORIDE 108 05/05/2013   CO2 25 09/12/2018   GLUCOSE 107 (H) 09/12/2018   BUN 12 09/12/2018   CREATININE 1.04 09/12/2018   BILITOT 0.9 09/12/2018   ALKPHOS 58 09/12/2018   AST 29 09/12/2018   ALT 18 09/12/2018   PROT 7.7 09/12/2018   ALBUMIN 4.9 09/12/2018    CALCIUM 10.1 09/12/2018   ANIONGAP 10 05/18/2016   GFR 51.49 (L) 09/12/2018   Lab Results  Component Value Date   CHOL 192 09/12/2018   Lab Results  Component Value Date   HDL 75.60 09/12/2018   Lab Results  Component Value Date   LDLCALC 90 09/12/2018   Lab Results  Component Value Date   TRIG 132.0 09/12/2018   Lab Results  Component Value Date   CHOLHDL 3 09/12/2018   Lab Results  Component Value Date   HGBA1C 5.9 03/04/2018      Assessment & Plan:   Problem List Items Addressed This Visit      Cardiovascular and Mediastinum   Essential hypertension     Endocrine   Hypothyroidism - Primary     Other   Decreased GFR      No orders of the defined types were placed in this encounter.   Follow-up: Return physical in January.   Patient will continue to hydrate well and take her new dose of levothyroxine on a fasting stomach.  She has a physical scheduled in January.

## 2018-12-26 DIAGNOSIS — R311 Benign essential microscopic hematuria: Secondary | ICD-10-CM | POA: Diagnosis not present

## 2018-12-26 DIAGNOSIS — N952 Postmenopausal atrophic vaginitis: Secondary | ICD-10-CM | POA: Diagnosis not present

## 2019-01-12 ENCOUNTER — Other Ambulatory Visit: Payer: Self-pay | Admitting: *Deleted

## 2019-01-12 DIAGNOSIS — C50311 Malignant neoplasm of lower-inner quadrant of right female breast: Secondary | ICD-10-CM

## 2019-01-12 DIAGNOSIS — Z170 Estrogen receptor positive status [ER+]: Secondary | ICD-10-CM

## 2019-01-12 DIAGNOSIS — M8589 Other specified disorders of bone density and structure, multiple sites: Secondary | ICD-10-CM

## 2019-01-12 MED ORDER — ALENDRONATE SODIUM 70 MG PO TABS: ORAL_TABLET | ORAL | 1 refills | Status: DC

## 2019-03-06 ENCOUNTER — Other Ambulatory Visit: Payer: Self-pay | Admitting: Family Medicine

## 2019-03-10 ENCOUNTER — Encounter: Payer: Medicare HMO | Admitting: Family Medicine

## 2019-03-10 ENCOUNTER — Ambulatory Visit: Payer: Medicare HMO

## 2019-03-15 ENCOUNTER — Ambulatory Visit: Payer: Medicare Other | Attending: Internal Medicine

## 2019-03-15 DIAGNOSIS — Z23 Encounter for immunization: Secondary | ICD-10-CM

## 2019-03-15 NOTE — Progress Notes (Signed)
   Covid-19 Vaccination Clinic  Name:  MELYNN KNEALE    MRN: [id number] DOB: 04-19-1940  03/15/2019  Ms. Mcaleer was observed post Covid-19 immunization for 15 minutes without incidence. She was provided with Vaccine Information Sheet and instruction to access the V-Safe system.   Ms. Antrim was instructed to call 911 with any severe reactions post vaccine: Marland Kitchen Difficulty breathing  . Swelling of your face and throat  . A fast heartbeat  . A bad rash all over your body  . Dizziness and weakness    Immunizations Administered    Name Date Dose VIS Date Route   Pfizer COVID-19 Vaccine 03/15/2019  9:10 AM 0.3 mL 02/03/2019 Intramuscular   Manufacturer: [employer], [employer]   Lot: [id number]   [place]: [id number]

## 2019-03-22 ENCOUNTER — Ambulatory Visit: Payer: Medicare HMO

## 2019-03-22 ENCOUNTER — Encounter: Payer: Medicare HMO | Admitting: Family Medicine

## 2019-03-22 ENCOUNTER — Other Ambulatory Visit: Payer: Self-pay

## 2019-03-23 ENCOUNTER — Encounter: Payer: Self-pay | Admitting: Family Medicine

## 2019-03-23 ENCOUNTER — Ambulatory Visit (INDEPENDENT_AMBULATORY_CARE_PROVIDER_SITE_OTHER): Payer: Medicare Other | Admitting: Family Medicine

## 2019-03-23 VITALS — BP 136/70 | HR 79 | Temp 97.3°F | Ht 60.0 in | Wt 128.2 lb

## 2019-03-23 DIAGNOSIS — E7800 Pure hypercholesterolemia, unspecified: Secondary | ICD-10-CM | POA: Diagnosis not present

## 2019-03-23 DIAGNOSIS — N029 Recurrent and persistent hematuria with unspecified morphologic changes: Secondary | ICD-10-CM | POA: Insufficient documentation

## 2019-03-23 DIAGNOSIS — I10 Essential (primary) hypertension: Secondary | ICD-10-CM | POA: Diagnosis not present

## 2019-03-23 DIAGNOSIS — E039 Hypothyroidism, unspecified: Secondary | ICD-10-CM

## 2019-03-23 DIAGNOSIS — R944 Abnormal results of kidney function studies: Secondary | ICD-10-CM | POA: Diagnosis not present

## 2019-03-23 LAB — URINALYSIS, ROUTINE W REFLEX MICROSCOPIC
Bilirubin Urine: NEGATIVE
Leukocytes,Ua: NEGATIVE
Nitrite: NEGATIVE
Specific Gravity, Urine: 1.015 (ref 1.000–1.030)
Total Protein, Urine: NEGATIVE
Urine Glucose: NEGATIVE
Urobilinogen, UA: 0.2 (ref 0.0–1.0)
pH: 6 (ref 5.0–8.0)

## 2019-03-23 LAB — LDL CHOLESTEROL, DIRECT: Direct LDL: 96 mg/dL

## 2019-03-23 LAB — BASIC METABOLIC PANEL
BUN: 13 mg/dL (ref 6–23)
CO2: 25 mEq/L (ref 19–32)
Calcium: 9.8 mg/dL (ref 8.4–10.5)
Chloride: 104 mEq/L (ref 96–112)
Creatinine, Ser: 0.98 mg/dL (ref 0.40–1.20)
GFR: 55.07 mL/min — ABNORMAL LOW (ref >60.00)
Glucose, Bld: 100 mg/dL — ABNORMAL HIGH (ref 70–99)
Potassium: 3.8 mEq/L (ref 3.5–5.1)
Sodium: 139 mEq/L (ref 135–145)

## 2019-03-23 LAB — CBC
HCT: 41.4 % (ref 36.0–46.0)
Hemoglobin: 13.9 g/dL (ref 12.0–15.0)
MCHC: 33.5 g/dL (ref 30.0–36.0)
MCV: 93 fl (ref 78.0–100.0)
Platelets: 275 10*3/uL (ref 150.0–400.0)
RBC: 4.45 Mil/uL (ref 3.87–5.11)
RDW: 13.1 % (ref 11.5–15.5)
WBC: 6.6 10*3/uL (ref 4.0–10.5)

## 2019-03-23 LAB — TSH: TSH: 9.27 u[IU]/mL — ABNORMAL HIGH (ref 0.35–4.50)

## 2019-03-23 MED ORDER — AMLODIPINE BESYLATE 5 MG PO TABS: 5.0000 mg | ORAL_TABLET | Freq: Every day | ORAL | 3 refills | Status: DC

## 2019-03-23 MED ORDER — ROSUVASTATIN CALCIUM 10 MG PO TABS: 10.0000 mg | ORAL_TABLET | Freq: Every day | ORAL | 3 refills | Status: DC

## 2019-03-23 MED ORDER — LEVOTHYROXINE SODIUM 75 MCG PO TABS: 75.0000 ug | ORAL_TABLET | Freq: Every day | ORAL | 0 refills | Status: DC

## 2019-03-23 NOTE — Addendum Note (Signed)
Addended by: Jon Billings on: 03/23/2019 01:27 PM   Modules accepted: Orders

## 2019-03-23 NOTE — Progress Notes (Addendum)
Established Patient Office Visit  Subjective:  Patient ID: Madison Huerta, female    DOB: [birth year]  Age: 79 y.o. MRN: [id number]  CC:  Chief Complaint  Patient presents with  . Annual Exam    CPE, no concerns at this time. Pt states that she will need refills on medications.     HPI Madison Huerta presents for follow-up of her hypothyroidism.  TSH had been low and we decreased the dose.  Blood pressure has been well controlled.  Tolerating Crestor well for her controlled cholesterol.  Status post Covid back seen.  Brother recently diagnosed and may not do well because of his pulmonary issues.  Status post urology work-up for her hematuria with CT scan and cystoscopy.  Fortunately everything was negative and was negative.    Past Medical History:  Diagnosis Date  . Breast cancer ([place]) 1994   L breast- lumpectomy, care for at [facility]   . Breast cancer [facility]) 2013   right breast- mastectomy, care at [facility]  . Cataract   . Endocarditis   . GERD (gastroesophageal reflux disease)   . Heart murmur    VSD- [facility]ogram - [facility] yrs. ago  . History of breast cancer   . Hyperlipidemia   . Hypertension   . Hypothyroidism   . Osteopenia   . Overweight(278.02)   . Personal history of radiation therapy 1994   left breast ca  . Thyroid disease    Hypothyroidism  . VSD (ventricular septal defect)    does need SBE prophylaxis (Keflex  3 gm 1 hour pprior and 1.5 gm 6 hours post  . VSD (ventricular septal defect)     Past Surgical History:  Procedure Laterality Date  . 2D echo  2004   Normal at [facility]  . AUGMENTATION MAMMAPLASTY Bilateral    removed 1994  . BREAST BIOPSY Left 1994   breast ca  . BREAST BIOPSY Right 2013   breast ca  . BREAST IMPLANT REMOVAL  1994  . BREAST LUMPECTOMY Left 1994   lumpectomy and rad tx and tamoxifen  . MASTECTOMY Right 01/08/2012   complete and anastrozole  . MASTECTOMY W/ SENTINEL NODE BIOPSY  01/08/2012   Procedure: MASTECTOMY WITH SENTINEL  LYMPH NODE BIOPSY;  Surgeon: Madilyn Hook, DO;  Location: [place];  Service: General;  Laterality: Right;  right breast mastectomy with sentinel lymph node biopsy   . PARATHYROIDECTOMY  12/2002  . TONSILLECTOMY     As a child  . TUBAL LIGATION     1976    Family History  Problem Relation Age of Onset  . Aneurysm Mother        brain  . Cancer Father        Lung, Liver mets  . Lung cancer Father 42  . Heart disease Other   . Cancer Cousin        Breast CA  . Breast cancer Cousin 30  . Breast cancer Maternal Aunt 29    Social History   Socioeconomic History  . Marital status: Married    Spouse name: Not on file  . Number of children: 2  . Years of education: Not on file  . Highest education level: Not on file  Occupational History  . Occupation: Animator: UNEMPLOYED  Tobacco Use  . Smoking status: Never Smoker  . Smokeless tobacco: Never Used  Substance and Sexual Activity  . Alcohol use: Yes    Alcohol/week: 3.0 standard drinks  Types: 3 Glasses of wine per week  . Drug use: No  . Sexual activity: Never  Other Topics Concern  . Not on file  Social History Narrative  . Not on file   Social Determinants of Health   Financial Resource Strain:   . Difficulty of Paying Living Expenses: Not on file  Food Insecurity:   . Worried About Charity fundraiser in the Last Year: Not on file  . Ran Out of Food in the Last Year: Not on file  Transportation Needs:   . Lack of Transportation (Medical): Not on file  . Lack of Transportation (Non-Medical): Not on file  Physical Activity:   . Days of Exercise per Week: Not on file  . Minutes of Exercise per Session: Not on file  Stress:   . Feeling of Stress : Not on file  Social Connections:   . Frequency of Communication with Friends and Family: Not on file  . Frequency of Social Gatherings with Friends and Family: Not on file  . Attends Religious Services: Not on file  . Active Member of Clubs or  Organizations: Not on file  . Attends Archivist Meetings: Not on file  . Marital Status: Not on file  Intimate Partner Violence:   . Fear of Current or Ex-Partner: Not on file  . Emotionally Abused: Not on file  . Physically Abused: Not on file  . Sexually Abused: Not on file    Outpatient Medications Prior to Visit  Medication Sig Dispense Refill  . acetaminophen (TYLENOL) 500 MG tablet Take 500 mg by mouth every 4 (four) hours as needed for fever.    Marland Kitchen alendronate (FOSAMAX) 70 MG tablet TAKE 1 TABLET BY MOUTH ONCE A WEEK, TAKE WITH FULL GLASS OF WATER ON AN EMPTY STOMACH 12 tablet 1  . anastrozole (ARIMIDEX) 1 MG tablet Take 1 tablet (1 mg total) by mouth daily. 90 tablet 4  . cholecalciferol (VITAMIN D) 1000 units tablet Take 1,000 Units by mouth daily.    Marland Kitchen amLODipine (NORVASC) 5 MG tablet Take 1 tablet (5 mg total) by mouth daily. 90 tablet 3  . levothyroxine (SYNTHROID) 50 MCG tablet Take 1 tablet (50 mcg total) by mouth daily. 90 tablet 1  . rosuvastatin (CRESTOR) 10 MG tablet Take 1 tablet (10 mg total) by mouth daily. 90 tablet 3  . fluticasone (FLONASE) 50 MCG/ACT nasal spray Place 2 sprays into both nostrils daily. (Patient not taking: Reported on 03/23/2019) 16 g 6   No facility-administered medications prior to visit.    Allergies  Allergen Reactions  . Codeine Other (See Comments)    Reaction:  Severe headaches   . Penicillins Hives and Other (See Comments)    Has patient had a PCN reaction causing immediate rash, facial/tongue/throat swelling, SOB or lightheadedness with hypotension: No Has patient had a PCN reaction causing severe rash involving mucus membranes or skin necrosis: No Has patient had a PCN reaction that required hospitalization No Has patient had a PCN reaction occurring within the last 10 years: No If all of the above answers are "NO", then may proceed with Cephalosporin use.  . Adhesive [Tape] Rash    ROS Review of Systems    Constitutional: Negative for diaphoresis, fatigue, fever and unexpected weight change.  HENT: Negative.   Eyes: Negative for photophobia and visual disturbance.  Respiratory: Negative.   Cardiovascular: Negative.   Gastrointestinal: Negative.   Endocrine: Negative for polyphagia and polyuria.  Genitourinary: Negative for difficulty urinating,  dysuria, frequency, hematuria and urgency.  Musculoskeletal: Negative for gait problem and joint swelling.  Skin: Negative for pallor and rash.  Neurological: Negative.   Hematological: Negative.   Psychiatric/Behavioral: Negative.    Depression screen [facility] 2/9 03/23/2019 03/23/2019 03/04/2018  Decreased Interest 0 0 0  Down, Depressed, Hopeless 0 0 0  PHQ - 2 Score 0 0 0  Altered sleeping 0 - 0  Tired, decreased energy 0 - 0  Change in appetite 0 - 0  Feeling bad or failure about yourself  0 - 0  Trouble concentrating 0 - 0  Moving slowly or fidgety/restless 0 - 0  Suicidal thoughts 0 - 0  PHQ-9 Score 0 - 0  Difficult doing work/chores - - Not difficult at all      Objective:    Physical Exam  BP 136/70   Pulse 79   Temp (!) 97.3 F (36.3 C) (Temporal)   Ht 5' (1.524 m)   Wt 128 lb 3.2 oz (58.2 kg)   SpO2 98%   BMI 25.04 kg/m  Wt Readings from Last 3 Encounters:  03/23/19 128 lb 3.2 oz (58.2 kg)  12/15/18 129 lb (58.5 kg)  09/12/18 128 lb 4 oz (58.2 kg)     There are no preventive care reminders to display for this patient.  There are no preventive care reminders to display for this patient.  Lab Results  Component Value Date   TSH 9.27 (H) 03/23/2019   Lab Results  Component Value Date   WBC 6.6 03/23/2019   HGB 13.9 03/23/2019   HCT 41.4 03/23/2019   MCV 93.0 03/23/2019   PLT 275.0 03/23/2019   Lab Results  Component Value Date   NA 139 03/23/2019   K 3.8 03/23/2019   CHLORIDE 108 05/05/2013   CO2 25 03/23/2019   GLUCOSE 100 (H) 03/23/2019   BUN 13 03/23/2019   CREATININE 0.98 03/23/2019   BILITOT 0.9  09/12/2018   ALKPHOS 58 09/12/2018   AST 29 09/12/2018   ALT 18 09/12/2018   PROT 7.7 09/12/2018   ALBUMIN 4.9 09/12/2018   CALCIUM 9.8 03/23/2019   ANIONGAP 10 05/18/2016   GFR 55.07 (L) 03/23/2019   Lab Results  Component Value Date   CHOL 192 09/12/2018   Lab Results  Component Value Date   HDL 75.60 09/12/2018   Lab Results  Component Value Date   LDLCALC 90 09/12/2018   Lab Results  Component Value Date   TRIG 132.0 09/12/2018   Lab Results  Component Value Date   CHOLHDL 3 09/12/2018   Lab Results  Component Value Date   HGBA1C 5.9 03/04/2018      Assessment & Plan:   Problem List Items Addressed This Visit      Cardiovascular and Mediastinum   Essential hypertension   Relevant Medications   amLODipine (NORVASC) 5 MG tablet   rosuvastatin (CRESTOR) 10 MG tablet   Other Relevant Orders   CBC (Completed)   Basic metabolic panel (Completed)     Endocrine   Hypothyroidism - Primary   Relevant Medications   levothyroxine (SYNTHROID) 75 MCG tablet   Other Relevant Orders   TSH (Completed)   TSH     Genitourinary   Benign hematuria   Relevant Orders   Urinalysis, Routine w reflex microscopic (Completed)     Other   Hyperlipidemia   Relevant Medications   amLODipine (NORVASC) 5 MG tablet   rosuvastatin (CRESTOR) 10 MG tablet   Other Relevant Orders  LDL cholesterol, direct (Completed)   Decreased GFR   Relevant Orders   CBC (Completed)   Basic metabolic panel (Completed)      Meds ordered this encounter  Medications  . amLODipine (NORVASC) 5 MG tablet    Sig: Take 1 tablet (5 mg total) by mouth daily.    Dispense:  90 tablet    Refill:  3  . rosuvastatin (CRESTOR) 10 MG tablet    Sig: Take 1 tablet (10 mg total) by mouth daily.    Dispense:  90 tablet    Refill:  3    Generic if possible please  . levothyroxine (SYNTHROID) 75 MCG tablet    Sig: Take 1 tablet (75 mcg total) by mouth daily.    Dispense:  90 tablet    Refill:  0     Follow-up: Return in about 6 months (around 09/20/2019), or if symptoms worsen or fail to improve.   Fortunately work-up for hematuria was negative.  We will keep an eye on it from time to time. Libby Maw, MD   Addendum: Patient assures me she has been taking the 50 mcg daily.  Have increased it to 75 mcg.  She will follow-up in a couple months for repeat Pete TSH.  Discussed her other blood work with her.  Benign hematuria persist with a small amount of blood seen in her urine.

## 2019-04-05 ENCOUNTER — Ambulatory Visit: Payer: Medicare Other | Attending: Internal Medicine

## 2019-04-05 DIAGNOSIS — Z23 Encounter for immunization: Secondary | ICD-10-CM | POA: Insufficient documentation

## 2019-04-05 NOTE — Progress Notes (Signed)
   Covid-19 Vaccination Clinic  Name:  Madison Huerta    MRN: [id number] DOB: 30-Jan-1941  04/05/2019  [name] was observed post Covid-19 immunization for 15 minutes without incidence. She was provided with Vaccine Information Sheet and instruction to access the V-Safe system.   [name] was instructed to call 911 with any severe reactions post vaccine: Marland Kitchen Difficulty breathing  . Swelling of your face and throat  . A fast heartbeat  . A bad rash all over your body  . Dizziness and weakness    Immunizations Administered    Name Date Dose VIS Date Route   Pfizer COVID-19 Vaccine 04/05/2019 11:01 AM 0.3 mL 02/03/2019 Intramuscular   Manufacturer: [employer], [employer]   Lot: [id number]   [place]: [id number]

## 2019-05-18 ENCOUNTER — Other Ambulatory Visit: Payer: Self-pay

## 2019-05-19 ENCOUNTER — Other Ambulatory Visit (INDEPENDENT_AMBULATORY_CARE_PROVIDER_SITE_OTHER): Payer: Medicare Other

## 2019-05-19 DIAGNOSIS — I10 Essential (primary) hypertension: Secondary | ICD-10-CM

## 2019-05-19 DIAGNOSIS — E039 Hypothyroidism, unspecified: Secondary | ICD-10-CM | POA: Diagnosis not present

## 2019-05-19 DIAGNOSIS — Z170 Estrogen receptor positive status [ER+]: Secondary | ICD-10-CM | POA: Diagnosis not present

## 2019-05-19 DIAGNOSIS — C50311 Malignant neoplasm of lower-inner quadrant of right female breast: Secondary | ICD-10-CM | POA: Diagnosis not present

## 2019-05-19 LAB — COMPREHENSIVE METABOLIC PANEL
ALT: 17 U/L (ref 0–35)
AST: 27 U/L (ref 0–37)
Albumin: 4.5 g/dL (ref 3.5–5.2)
Alkaline Phosphatase: 72 U/L (ref 39–117)
BUN: 13 mg/dL (ref 6–23)
CO2: 26 mEq/L (ref 19–32)
Calcium: 9.5 mg/dL (ref 8.4–10.5)
Chloride: 102 mEq/L (ref 96–112)
Creatinine, Ser: 0.95 mg/dL (ref 0.40–1.20)
GFR: 57.05 mL/min — ABNORMAL LOW (ref >60.00)
Glucose, Bld: 93 mg/dL (ref 70–99)
Potassium: 3.9 mEq/L (ref 3.5–5.1)
Sodium: 138 mEq/L (ref 135–145)
Total Bilirubin: 0.7 mg/dL (ref 0.2–1.2)
Total Protein: 7 g/dL (ref 6.0–8.3)

## 2019-05-19 LAB — CBC WITH DIFFERENTIAL/PLATELET
Basophils Absolute: 0.1 10*3/uL (ref 0.0–0.1)
Basophils Relative: 2.2 % (ref 0.0–3.0)
Eosinophils Absolute: 0.2 10*3/uL (ref 0.0–0.7)
Eosinophils Relative: 3 % (ref 0.0–5.0)
HCT: 40 % (ref 36.0–46.0)
Hemoglobin: 13.5 g/dL (ref 12.0–15.0)
Lymphocytes Relative: 26.2 % (ref 12.0–46.0)
Lymphs Abs: 1.7 10*3/uL (ref 0.7–4.0)
MCHC: 33.7 g/dL (ref 30.0–36.0)
MCV: 91.3 fl (ref 78.0–100.0)
Monocytes Absolute: 0.8 10*3/uL (ref 0.1–1.0)
Monocytes Relative: 13.2 % — ABNORMAL HIGH (ref 3.0–12.0)
Neutro Abs: 3.5 10*3/uL (ref 1.4–7.7)
Neutrophils Relative %: 55.4 % (ref 43.0–77.0)
Platelets: 298 10*3/uL (ref 150.0–400.0)
RBC: 4.38 Mil/uL (ref 3.87–5.11)
RDW: 12.9 % (ref 11.5–15.5)
WBC: 6.4 10*3/uL (ref 4.0–10.5)

## 2019-05-19 LAB — TSH: TSH: 0.39 u[IU]/mL (ref 0.35–4.50)

## 2019-05-23 NOTE — Progress Notes (Signed)
Nurse connected with patient 05/24/19 at  8:00 AM EDT by a telephone enabled telemedicine application and verified that I am speaking with the correct person using two identifiers. Patient stated full name and DOB. Patient gave permission to continue with virtual visit. Patient's location was at home and Nurse's location was at [place] office.  Subjective:   Madison Huerta is a 79 y.o. female who presents for Medicare Annual (Subsequent) preventive examination.  Review of Systems:  Cardiac Risk Factors include: advanced age (>43men, >40 women);dyslipidemia Home Safety/Smoke Alarms: Feels safe in home. Smoke alarms in place.  Lives with husband in 1 story home.    Female:   Mammo- 12/05/18      Dexa scan- 12/05/18       CCS- 10/20/16.     Objective:     Vitals: Unable to assess. This visit is enabled though telemedicine due to Covid 19.   Advanced Directives 05/24/2019 07/15/2018 03/04/2018 07/14/2017 03/03/2017 07/13/2016 05/18/2016  Does Patient Have a Medical Advance Directive? Yes Yes Yes Yes Yes Yes Yes  Type of Paramedic of [place];Living will [place];Living will [place];Living will Living will;Healthcare Power of Register;Living will [place];Living will [place];Living will  Does patient want to make changes to medical advance directive? No - Patient declined - - No - Patient declined - - No - Patient declined  Copy of [place] in Chart? Yes - validated most recent copy scanned in chart (See row information) - No - copy requested No - copy requested No - copy requested No - copy requested Yes  Would patient like information on creating a medical advance directive? - - - - - - -    Tobacco Social History   Tobacco Use  Smoking Status Never Smoker  Smokeless Tobacco Never Used     Counseling given: Not Answered   Clinical  Intake: Pain : No/denies pain     Past Medical History:  Diagnosis Date  . Breast cancer ([place]) 1994   L breast- lumpectomy, [facility] at [facility]   . Breast cancer [facility]) 2013   right breast- mastectomy, care at [facility]  . Cataract   . Endocarditis   . GERD (gastroesophageal reflux disease)   . Heart murmur    VSD- [facility]ogram - [facility] yrs[facility] ago  . History of breast cancer   . Hyperlipidemia   . Hypertension   . Hypothyroidism   . Osteopenia   . Overweight(278.02)   . Personal history of radiation therapy 1994   left breast ca  . Thyroid disease    Hypothyroidism  . VSD (ventricular septal defect)    does need SBE prophylaxis (Keflex  3 gm 1 hour pprior and 1.5 gm 6 hours post  . VSD (ventricular septal defect)    Past Surgical History:  Procedure Laterality Date  . 2D echo  2004   Normal at [facility]  . AUGMENTATION MAMMAPLASTY Bilateral    removed 1994  . BREAST BIOPSY Left 1994   breast ca  . BREAST BIOPSY Right 2013   breast ca  . BREAST IMPLANT REMOVAL  1994  . BREAST LUMPECTOMY Left 1994   lumpectomy and rad tx and tamoxifen  . MASTECTOMY Right 01/08/2012   complete and anastrozole  . MASTECTOMY W/ SENTINEL NODE BIOPSY  01/08/2012   Procedure: MASTECTOMY WITH SENTINEL LYMPH NODE BIOPSY;  Surgeon: Madilyn Hook, DO;  Location: [place];  Service: General;  Laterality: Right;  right breast mastectomy with sentinel lymph node biopsy   . PARATHYROIDECTOMY  12/2002  . TONSILLECTOMY     As a child  . TUBAL LIGATION     1976   Family History  Problem Relation Age of Onset  . Aneurysm Mother        brain  . Cancer Father        Lung, Liver mets  . Lung cancer Father 91  . Heart disease Other   . Cancer Cousin        Breast CA  . Breast cancer Cousin 30  . Breast cancer Maternal Aunt 49   Social History   Socioeconomic History  . Marital status: Married    Spouse name: Not on file  . Number of children: 2  . Years of education: Not on file  . Highest education  level: Not on file  Occupational History  . Occupation: Animator: UNEMPLOYED  Tobacco Use  . Smoking status: Never Smoker  . Smokeless tobacco: Never Used  Substance and Sexual Activity  . Alcohol use: Yes    Alcohol/week: 3.0 standard drinks    Types: 3 Glasses of wine per week  . Drug use: No  . Sexual activity: Never  Other Topics Concern  . Not on file  Social History Narrative  . Not on file   Social Determinants of Health   Financial Resource Strain: Low Risk   . Difficulty of Paying Living Expenses: Not hard at all  Food Insecurity: No Food Insecurity  . Worried About Charity fundraiser in the Last Year: Never true  . Ran Out of Food in the Last Year: Never true  Transportation Needs: No Transportation Needs  . Lack of Transportation (Medical): No  . Lack of Transportation (Non-Medical): No  Physical Activity:   . Days of Exercise per Week:   . Minutes of Exercise per Session:   Stress:   . Feeling of Stress :   Social Connections:   . Frequency of Communication with Friends and Family:   . Frequency of Social Gatherings with Friends and Family:   . Attends Religious Services:   . Active Member of Clubs or Organizations:   . Attends Archivist Meetings:   Marland Kitchen Marital Status:     Outpatient Encounter Medications as of 05/24/2019  Medication Sig  . acetaminophen (TYLENOL) 500 MG tablet Take 500 mg by mouth every 4 (four) hours as needed for fever.  Marland Kitchen alendronate (FOSAMAX) 70 MG tablet TAKE 1 TABLET BY MOUTH ONCE A WEEK, TAKE WITH FULL GLASS OF WATER ON AN EMPTY STOMACH  . amLODipine (NORVASC) 5 MG tablet Take 1 tablet (5 mg total) by mouth daily.  Marland Kitchen anastrozole (ARIMIDEX) 1 MG tablet Take 1 tablet (1 mg total) by mouth daily.  . cholecalciferol (VITAMIN D) 1000 units tablet Take 1,000 Units by mouth daily.  Marland Kitchen levothyroxine (SYNTHROID) 75 MCG tablet Take 1 tablet (75 mcg total) by mouth daily.  . rosuvastatin (CRESTOR) 10 MG tablet Take  1 tablet (10 mg total) by mouth daily.  . [DISCONTINUED] fluticasone (FLONASE) 50 MCG/ACT nasal spray Place 2 sprays into both nostrils daily. (Patient not taking: Reported on 03/23/2019)   No facility-administered encounter medications on file as of 05/24/2019.    Activities of Daily Living In your present state of health, do you have any difficulty performing the following activities: 05/24/2019  Hearing? N  Vision? N  Difficulty  concentrating or making decisions? N  Walking or climbing stairs? N  Dressing or bathing? N  Doing errands, shopping? N  Preparing Food and eating ? N  Using the Toilet? N  In the past six months, have you accidently leaked urine? N  Do you have problems with loss of bowel control? N  Managing your Medications? N  Managing your Finances? N  Housekeeping or managing your Housekeeping? N  Some recent data might be hidden    Patient Care Team: Libby Maw, MD as PCP - General (Family Medicine) Cammie Sickle, MD as Consulting Physician (Internal Medicine) Karsten Fells, DDS as Consulting Physician (Dentistry) Kathyrn Lass, OD as Consulting Physician (Optometry)    Assessment:   This is a routine wellness examination for Aubreylynn. Physical assessment deferred to PCP.  Exercise Activities and Dietary recommendations Current Exercise Habits: Home exercise routine, Type of exercise: walking, Time (Minutes): 40, Frequency (Times/Week): 5, Weekly Exercise (Minutes/Week): 200, Intensity: Mild, Exercise limited by: None identified   Diet (meal preparation, eat out, water intake, caffeinated beverages, dairy products, fruits and vegetables): well balanced   Goals    . Increase physical activity     Weather permitting, I will continue to walk for 30 minutes daily.        Fall Risk Fall Risk  05/24/2019 03/23/2019 03/04/2018 03/03/2017 02/21/2016  Falls in the past year? 0 0 0 No No  Number falls in past yr: 0 - - - -  Injury with Fall? 0 - - -  -  Follow up Education provided;Falls prevention discussed - - - -   Depression Screen PHQ 2/9 Scores 05/24/2019 03/23/2019 03/23/2019 03/04/2018  PHQ - 2 Score 0 0 0 0  PHQ- 9 Score - 0 - 0     Cognitive Function Ad8 score reviewed for issues:  Issues making decisions:no  Less interest in hobbies / activities:no  Repeats questions, stories (family complaining):no  Trouble using ordinary gadgets (microwave, computer, phone):no  Forgets the month or year: no  Mismanaging finances: no  Remembering appts:no  Daily problems with thinking and/or memory:no Ad8 score is=0     MMSE - Mini Mental State Exam 03/04/2018 03/03/2017 02/21/2016  Orientation to time 5 5 5   Orientation to Place 5 5 5   Registration 3 3 3   Attention/ Calculation 0 0 0  Recall 3 3 3   Language- name 2 objects 0 0 0  Language- repeat 1 1 1   Language- follow 3 step command 3 3 3   Language- read & follow direction 0 0 0  Write a sentence 0 0 0  Copy design 0 0 0  Total score 20 20 20         Immunization History  Administered Date(s) Administered  . Fluad Quad(high Dose 65+) 10/15/2018  . Influenza Split 11/20/2010  . Influenza Whole 12/15/2006, 01/23/2009, 12/13/2009, 11/16/2011  . Influenza, High Dose Seasonal PF 12/01/2014, 11/18/2016, 12/17/2017  . Influenza,inj,Quad PF,6+ Mos 10/21/2015  . Influenza-Unspecified 11/22/2012, 10/07/2013, 10/15/2018  . PFIZER SARS-COV-2 Vaccination 03/15/2019, 04/05/2019  . Pneumococcal Conjugate-13 02/21/2014  . Pneumococcal Polysaccharide-23 01/14/2004, 02/12/2010  . Td 11/28/2001  . Tdap 02/18/2011  . Zoster 02/06/2008  . Zoster Recombinat (Shingrix) 05/25/2017, 09/15/2017     Screening Tests Health Maintenance  Topic Date Due  . PAP SMEAR-Modifier  03/04/2027 (Originally 02/25/2017)  . MAMMOGRAM  12/05/2019  . TETANUS/TDAP  02/17/2021  . INFLUENZA VACCINE  Completed  . DEXA SCAN  Completed  . PNA vac Low Risk Adult  Completed  Plan:    Please  schedule your next medicare wellness visit with me in 1 yr.  Continue to eat heart healthy diet (full of fruits, vegetables, whole grains, lean protein, water--limit salt, fat, and sugar intake) and increase physical activity as tolerated.  Continue doing brain stimulating activities (puzzles, reading, adult coloring books, staying active) to keep memory sharp.     I have personally reviewed and noted the following in the patient's chart:   . Medical and social history . Use of alcohol, tobacco or illicit drugs  . Current medications and supplements . Functional ability and status . Nutritional status . Physical activity . Advanced directives . List of other physicians . Hospitalizations, surgeries, and ER visits in previous 12 months . Vitals . Screenings to include cognitive, depression, and falls . Referrals and appointments  In addition, I have reviewed and discussed with patient certain preventive protocols, quality metrics, and best practice recommendations. A written personalized care plan for preventive services as well as general preventive health recommendations were provided to patient.     Shela Nevin, [place]  05/24/2019

## 2019-05-24 ENCOUNTER — Encounter: Payer: Self-pay | Admitting: *Deleted

## 2019-05-24 ENCOUNTER — Ambulatory Visit (INDEPENDENT_AMBULATORY_CARE_PROVIDER_SITE_OTHER): Payer: Medicare Other | Admitting: *Deleted

## 2019-05-24 DIAGNOSIS — Z0000 Encounter for general adult medical examination without abnormal findings: Secondary | ICD-10-CM

## 2019-05-24 NOTE — Patient Instructions (Signed)
Please schedule your next medicare wellness visit with me in 1 yr.  Continue to eat heart healthy diet (full of fruits, vegetables, whole grains, lean protein, water--limit salt, fat, and sugar intake) and increase physical activity as tolerated.  Continue doing brain stimulating activities (puzzles, reading, adult coloring books, staying active) to keep memory sharp.    Madison Huerta , Thank you for taking time to come for your Medicare Wellness Visit. I appreciate your ongoing commitment to your health goals. Please review the following plan we discussed and let me know if I can assist you in the future.   These are the goals we discussed: Goals    . Increase physical activity     Weather permitting, I will continue to walk for 30 minutes daily.        This is a list of the screening recommended for you and due dates:  Health Maintenance  Topic Date Due  . Pap Smear  03/04/2027*  . Mammogram  12/05/2019  . Tetanus Vaccine  02/17/2021  . Flu Shot  Completed  . DEXA scan (bone density measurement)  Completed  . Pneumonia vaccines  Completed  *Topic was postponed. The date shown is not the original due date.    Preventive Care 7 Years and Older, Female Preventive care refers to lifestyle choices and visits with your health care provider that can promote health and wellness. This includes:  A yearly physical exam. This is also called an annual well check.  Regular dental and eye exams.  Immunizations.  Screening for certain conditions.  Healthy lifestyle choices, such as diet and exercise. What can I expect for my preventive care visit? Physical exam Your health care provider will check:  Height and weight. These may be used to calculate body mass index (BMI), which is a measurement that tells if you are at a healthy weight.  Heart rate and blood pressure.  Your skin for abnormal spots. Counseling Your health care provider may ask you questions about:  Alcohol,  tobacco, and drug use.  Emotional well-being.  Home and relationship well-being.  Sexual activity.  Eating habits.  History of falls.  Memory and ability to understand (cognition).  Work and work Statistician.  Pregnancy and menstrual history. What immunizations do I need?  Influenza (flu) vaccine  This is recommended every year. Tetanus, diphtheria, and pertussis (Tdap) vaccine  You may need a Td booster every 10 years. Varicella (chickenpox) vaccine  You may need this vaccine if you have not already been vaccinated. Zoster (shingles) vaccine  You may need this after age 56. Pneumococcal conjugate (PCV13) vaccine  One dose is recommended after age 70. Pneumococcal polysaccharide (PPSV23) vaccine  One dose is recommended after age 78. Measles, mumps, and rubella (MMR) vaccine  You may need at least one dose of MMR if you were born in 1957 or later. You may also need a second dose. Meningococcal conjugate (MenACWY) vaccine  You may need this if you have certain conditions. Hepatitis A vaccine  You may need this if you have certain conditions or if you travel or work in places where you may be exposed to hepatitis A. Hepatitis B vaccine  You may need this if you have certain conditions or if you travel or work in places where you may be exposed to hepatitis B. Haemophilus influenzae type b (Hib) vaccine  You may need this if you have certain conditions. You may receive vaccines as individual doses or as more than one vaccine together  in one shot (combination vaccines). Talk with your health care provider about the risks and benefits of combination vaccines. What tests do I need? Blood tests  Lipid and cholesterol levels. These may be checked every 5 years, or more frequently depending on your overall health.  Hepatitis C test.  Hepatitis B test. Screening  Lung cancer screening. You may have this screening every year starting at age 45 if you have a  30-pack-year history of smoking and currently smoke or have quit within the past 15 years.  Colorectal cancer screening. All adults should have this screening starting at age 86 and continuing until age 35. Your health care provider may recommend screening at age 70 if you are at increased risk. You will have tests every 1-10 years, depending on your results and the type of screening test.  Diabetes screening. This is done by checking your blood sugar (glucose) after you have not eaten for a while (fasting). You may have this done every 1-3 years.  Mammogram. This may be done every 1-2 years. Talk with your health care provider about how often you should have regular mammograms.  BRCA-related cancer screening. This may be done if you have a family history of breast, ovarian, tubal, or peritoneal cancers. Other tests  Sexually transmitted disease (STD) testing.  Bone density scan. This is done to screen for osteoporosis. You may have this done starting at age 73. Follow these instructions at home: Eating and drinking  Eat a diet that includes fresh fruits and vegetables, whole grains, lean protein, and low-fat dairy products. Limit your intake of foods with high amounts of sugar, saturated fats, and salt.  Take vitamin and mineral supplements as recommended by your health care provider.  Do not drink alcohol if your health care provider tells you not to drink.  If you drink alcohol: ? Limit how much you have to 0-1 drink a day. ? Be aware of how much alcohol is in your drink. In the [place] one drink equals one 12 oz bottle of beer (355 mL), one 5 oz glass of wine (148 mL), or one 1 oz glass of hard liquor (44 mL). Lifestyle  Take daily care of your teeth and gums.  Stay active. Exercise for at least 30 minutes on 5 or more days each week.  Do not use any products that contain nicotine or tobacco, such as cigarettes, e-cigarettes, and chewing tobacco. If you need help quitting, ask your  health care provider.  If you are sexually active, practice safe sex. Use a condom or other form of protection in order to prevent STIs (sexually transmitted infections).  Talk with your health care provider about taking a low-dose aspirin or statin. What's next?  Go to your health care provider once a year for a well check visit.  Ask your health care provider how often you should have your eyes and teeth checked.  Stay up to date on all vaccines. This information is not intended to replace advice given to you by your health care provider. Make sure you discuss any questions you have with your health care provider. Document Revised: 02/03/2018 Document Reviewed: 02/03/2018 Elsevier Patient Education  2020 [employer].

## 2019-06-04 ENCOUNTER — Other Ambulatory Visit: Payer: Self-pay | Admitting: Family Medicine

## 2019-06-04 DIAGNOSIS — E039 Hypothyroidism, unspecified: Secondary | ICD-10-CM

## 2019-07-17 ENCOUNTER — Inpatient Hospital Stay: Payer: Medicare Other

## 2019-07-17 ENCOUNTER — Inpatient Hospital Stay: Payer: Medicare Other | Admitting: Internal Medicine

## 2019-07-18 ENCOUNTER — Other Ambulatory Visit: Payer: Self-pay

## 2019-07-18 DIAGNOSIS — C50311 Malignant neoplasm of lower-inner quadrant of right female breast: Secondary | ICD-10-CM

## 2019-07-18 DIAGNOSIS — Z170 Estrogen receptor positive status [ER+]: Secondary | ICD-10-CM

## 2019-07-19 ENCOUNTER — Inpatient Hospital Stay: Payer: Medicare Other | Attending: Internal Medicine

## 2019-07-19 ENCOUNTER — Encounter: Payer: Self-pay | Admitting: Internal Medicine

## 2019-07-19 ENCOUNTER — Other Ambulatory Visit: Payer: Self-pay

## 2019-07-19 ENCOUNTER — Inpatient Hospital Stay: Payer: Medicare Other | Admitting: Internal Medicine

## 2019-07-19 VITALS — BP 166/66 | HR 76 | Temp 98.0°F | Resp 18 | Wt 127.6 lb

## 2019-07-19 DIAGNOSIS — Z801 Family history of malignant neoplasm of trachea, bronchus and lung: Secondary | ICD-10-CM | POA: Diagnosis not present

## 2019-07-19 DIAGNOSIS — Z803 Family history of malignant neoplasm of breast: Secondary | ICD-10-CM | POA: Diagnosis not present

## 2019-07-19 DIAGNOSIS — E039 Hypothyroidism, unspecified: Secondary | ICD-10-CM | POA: Insufficient documentation

## 2019-07-19 DIAGNOSIS — M8580 Other specified disorders of bone density and structure, unspecified site: Secondary | ICD-10-CM

## 2019-07-19 DIAGNOSIS — Z170 Estrogen receptor positive status [ER+]: Secondary | ICD-10-CM | POA: Diagnosis not present

## 2019-07-19 DIAGNOSIS — C50311 Malignant neoplasm of lower-inner quadrant of right female breast: Secondary | ICD-10-CM

## 2019-07-19 DIAGNOSIS — Z79811 Long term (current) use of aromatase inhibitors: Secondary | ICD-10-CM | POA: Diagnosis not present

## 2019-07-19 DIAGNOSIS — N952 Postmenopausal atrophic vaginitis: Secondary | ICD-10-CM | POA: Diagnosis not present

## 2019-07-19 DIAGNOSIS — E785 Hyperlipidemia, unspecified: Secondary | ICD-10-CM | POA: Insufficient documentation

## 2019-07-19 DIAGNOSIS — I10 Essential (primary) hypertension: Secondary | ICD-10-CM | POA: Insufficient documentation

## 2019-07-19 DIAGNOSIS — Z79899 Other long term (current) drug therapy: Secondary | ICD-10-CM | POA: Insufficient documentation

## 2019-07-19 DIAGNOSIS — K219 Gastro-esophageal reflux disease without esophagitis: Secondary | ICD-10-CM | POA: Diagnosis not present

## 2019-07-19 LAB — CBC WITH DIFFERENTIAL/PLATELET
Abs Immature Granulocytes: 0.02 10*3/uL (ref 0.00–0.07)
Basophils Absolute: 0.1 10*3/uL (ref 0.0–0.1)
Basophils Relative: 1 %
Eosinophils Absolute: 0.1 10*3/uL (ref 0.0–0.5)
Eosinophils Relative: 2 %
HCT: 39.4 % (ref 36.0–46.0)
Hemoglobin: 13.5 g/dL (ref 12.0–15.0)
Immature Granulocytes: 0 %
Lymphocytes Relative: 23 %
Lymphs Abs: 1.4 10*3/uL (ref 0.7–4.0)
MCH: 30.4 pg (ref 26.0–34.0)
MCHC: 34.3 g/dL (ref 30.0–36.0)
MCV: 88.7 fL (ref 80.0–100.0)
Monocytes Absolute: 0.6 10*3/uL (ref 0.1–1.0)
Monocytes Relative: 10 %
Neutro Abs: 3.9 10*3/uL (ref 1.7–7.7)
Neutrophils Relative %: 64 %
Platelets: 263 10*3/uL (ref 150–400)
RBC: 4.44 MIL/uL (ref 3.87–5.11)
RDW: 12.5 % (ref 11.5–15.5)
WBC: 6.2 10*3/uL (ref 4.0–10.5)
nRBC: 0 % (ref 0.0–0.2)

## 2019-07-19 LAB — COMPREHENSIVE METABOLIC PANEL
ALT: 20 U/L (ref 0–44)
AST: 32 U/L (ref 15–41)
Albumin: 4.8 g/dL (ref 3.5–5.0)
Alkaline Phosphatase: 59 U/L (ref 38–126)
Anion gap: 9 (ref 5–15)
BUN: 17 mg/dL (ref 8–23)
CO2: 24 mmol/L (ref 22–32)
Calcium: 9.4 mg/dL (ref 8.9–10.3)
Chloride: 104 mmol/L (ref 98–111)
Creatinine, Ser: 0.95 mg/dL (ref 0.44–1.00)
GFR calc Af Amer: >60 mL/min (ref >60)
GFR calc non Af Amer: 58 mL/min — ABNORMAL LOW (ref >60)
Glucose, Bld: 94 mg/dL (ref 70–99)
Potassium: 3.6 mmol/L (ref 3.5–5.1)
Sodium: 137 mmol/L (ref 135–145)
Total Bilirubin: 0.9 mg/dL (ref 0.3–1.2)
Total Protein: 7.9 g/dL (ref 6.5–8.1)

## 2019-07-19 NOTE — Assessment & Plan Note (Addendum)
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  #Clinically no evidence of recurrence.  Stable.  Await mammogram in October 2020.  Continue as well.  Tolerating well without any major side effects.  #Microscopic hematuria-s/p urology evaluation [cystoscopy CT scan]-question related to atrophic vaginitis.  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  Discussed that Arimidex could be discontinued if she is very symptomatic.  In general would recommend Arimidex until 2023.  #Left breast lumpectomy [more than 20 years ago]-?  Sutures.  Will discuss with surgery.  #DEXA scan Oct 2020 osteopenia T score -1.7-STABLE; continue Fosamax;  Continue the same for now.  # DISPOSITION:  # follow up in 12 months-MD/ labs- cbc/cmp/ Dexa scan prior  # LEFT  mammo in October 2021- Dr.B

## 2019-07-19 NOTE — Progress Notes (Signed)
Pt in for yearly follow up, denies any breast concerns.,  Pt did have questions regarding if Arimidex could cause trace blood to be detected in urine.

## 2019-07-19 NOTE — Progress Notes (Signed)
[facility] OFFICE PROGRESS NOTE  Patient Care Team: Libby Maw, MD as PCP - General (Family Medicine) Cammie Sickle, MD as Consulting Physician (Internal Medicine) Karsten Fells, DDS as Consulting Physician (Dentistry) Kathyrn Lass, OD as Consulting Physician (Optometry)  Cancer Staging Carcinoma of lower-inner quadrant of right breast in female, estrogen receptor positive ([place]) Staging form: Breast, AJCC 7th Edition - Clinical: No stage assigned - Unsigned    Oncology History Overview Note  # 1994- LEFT BREAST CA s/p Lumpec; RT- Tam  # OCT 2013-  RIGHT BREAST CA pT1c [multifocal-1.2cm s/p Mastec] pN0 ER/PR-pos;Her 2Neu-NEG;Dec 2013- Arimidex; mammo-Sep 2017-N; BREAST CANCER INDEX- BENEFIT from extended AI.  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  female pleasant patient above history of stage I breast cancer is here for follow-up.  In the interim patient was evaluated by urology-for microscopic hematuria had a cystoscopy CT scan unremarkable.   Otherwise denies any hot flashes.  Denies any reflux problems.  Denies any bone pain.  No shortness of breath or cough.   Review of Systems  Constitutional: Negative for chills, diaphoresis, fever, malaise/fatigue and weight loss.  HENT: Negative for nosebleeds and sore throat.   Eyes: Negative for double vision.  Respiratory: Negative for cough, hemoptysis, sputum production, shortness of breath and wheezing.   Cardiovascular: Negative for chest pain, palpitations, orthopnea and leg swelling.  Gastrointestinal: Negative  for abdominal pain, blood in stool, constipation, diarrhea, heartburn, melena, nausea and vomiting.  Genitourinary: Negative for dysuria, frequency and urgency.  Musculoskeletal: Negative for back pain and joint pain.  Skin: Negative.  Negative for itching and rash.  Neurological: Negative for dizziness, tingling, focal weakness, weakness and headaches.  Endo/Heme/Allergies: Does not bruise/bleed easily.  Psychiatric/Behavioral: Negative for depression. The patient is not nervous/anxious and does not have insomnia.       PAST MEDICAL HISTORY :  Past Medical History:  Diagnosis Date  . Breast cancer ([place]) 1994   L breast- lumpectomy, care for at [facility]   . Breast cancer [facility]) 2013   right breast- mastectomy, [facility] at [facility]  . Cataract   . Endocarditis   . GERD (gastroesophageal reflux disease)   . Heart murmur    VSD- echocardiogram - [facility] 10 yrs. ago  . History of breast cancer   . Hyperlipidemia   . Hypertension   . Hypothyroidism   . Osteopenia   . Overweight(278.02)   . Personal history of radiation therapy 1994   left breast ca  . Thyroid disease    Hypothyroidism  . VSD (ventricular septal defect)    does need SBE prophylaxis (Keflex  3 gm 1 hour pprior and 1.5 gm 6 hours post  . VSD (ventricular septal defect)     PAST SURGICAL HISTORY :   Past Surgical History:  Procedure Laterality Date  . 2D echo  2004   Normal at [facility]  . AUGMENTATION MAMMAPLASTY Bilateral    removed 1994  . BREAST BIOPSY Left 1994   breast ca  . BREAST BIOPSY Right 2013   breast ca  . BREAST IMPLANT REMOVAL  1994  . BREAST  LUMPECTOMY Left 1994   lumpectomy and rad tx and tamoxifen  . MASTECTOMY Right 01/08/2012   complete and anastrozole  . MASTECTOMY W/ SENTINEL NODE BIOPSY  01/08/2012   Procedure: MASTECTOMY WITH SENTINEL LYMPH NODE BIOPSY;  Surgeon: Madilyn Hook, DO;  Location: [facility];  Service: General;  Laterality: Right;  right breast mastectomy with sentinel lymph node biopsy   .  PARATHYROIDECTOMY  12/2002  . TONSILLECTOMY     As a child  . TUBAL LIGATION     1976    FAMILY HISTORY :   Family History  Problem Relation Age of Onset  . Aneurysm Mother        brain  . Cancer Father        Lung, Liver mets  . Lung cancer Father 58  . Heart disease Other   . Cancer Cousin        Breast CA  . Breast cancer Cousin 30  . Breast cancer Maternal Aunt 23    SOCIAL HISTORY:   Social History   Tobacco Use  . Smoking status: Never Smoker  . Smokeless tobacco: Never Used  Substance Use Topics  . Alcohol use: Yes    Alcohol/week: 3.0 standard drinks    Types: 3 Glasses of wine per week  . Drug use: No    ALLERGIES:  is allergic to codeine; penicillins; and adhesive [tape].  MEDICATIONS:  Current Outpatient Medications  Medication Sig Dispense Refill  . acetaminophen (TYLENOL) 500 MG tablet Take 500 mg by mouth every 4 (four) hours as needed for fever.    Marland Kitchen alendronate (FOSAMAX) 70 MG tablet TAKE 1 TABLET BY MOUTH ONCE A WEEK, TAKE WITH FULL GLASS OF WATER ON AN EMPTY STOMACH 12 tablet 1  . amLODipine (NORVASC) 5 MG tablet Take 1 tablet (5 mg total) by mouth daily. 90 tablet 3  . anastrozole (ARIMIDEX) 1 MG tablet Take 1 tablet (1 mg total) by mouth daily. 90 tablet 4  . cholecalciferol (VITAMIN D) 1000 units tablet Take 1,000 Units by mouth daily.    Marland Kitchen levothyroxine (SYNTHROID) 75 MCG tablet TAKE 1 TABLET(75 MCG) BY MOUTH DAILY 90 tablet 0  . rosuvastatin (CRESTOR) 10 MG tablet Take 1 tablet (10 mg total) by mouth daily. 90 tablet 3   No current facility-administered medications for this visit.    PHYSICAL EXAMINATION: ECOG PERFORMANCE STATUS: 0 - Asymptomatic  BP (!) 166/66 (BP Location: Left Arm, Patient Position: Sitting)   Pulse 76   Temp 98 F (36.7 C) (Tympanic)   Resp 18   Wt 127 lb 9.6 oz (57.9 kg)   SpO2 100%   BMI 24.92 kg/m   Filed Weights   07/19/19 0952  Weight: 127 lb 9.6 oz (57.9 kg)    Physical Exam  Constitutional: She is  oriented to person, place, and time and well-developed, well-nourished, and in no distress.  HENT:  Head: Normocephalic and atraumatic.  Mouth/Throat: Oropharynx is clear and moist. No oropharyngeal exudate.  Eyes: Pupils are equal, round, and reactive to light.  Cardiovascular: Normal rate and regular rhythm.  Pulmonary/Chest: No respiratory distress. She has no wheezes.  Abdominal: Soft. Bowel sounds are normal. She exhibits no distension and no mass. There is no abdominal tenderness. There is no rebound and no guarding.  Musculoskeletal:        General: No tenderness or edema. Normal range of motion.     Cervical back: Normal range of motion and neck supple.  Neurological: She is alert and oriented  to person, place, and time.  Skin: Skin is warm.  Right mastectomy site noted.  No concerns for recurrence.  Left breast-lumpectomy incision noted.  No dominant lesions noted.  However patient has prominent suture-like material sticking out of lumpectomy incision.   Psychiatric: Affect normal.      LABORATORY DATA:  I have reviewed the data as listed    Component Value Date/Time   NA 137 07/19/2019 0900   NA 144 05/05/2013 0807   K 3.6 07/19/2019 0900   K 3.8 05/05/2013 0807   CL 104 07/19/2019 0900   CL 105 05/06/2012 1433   CO2 24 07/19/2019 0900   CO2 25 05/05/2013 0807   GLUCOSE 94 07/19/2019 0900   GLUCOSE 97 05/05/2013 0807   GLUCOSE 93 05/06/2012 1433   BUN 17 07/19/2019 0900   BUN 15.6 05/05/2013 0807   CREATININE 0.95 07/19/2019 0900   CREATININE 1.06 11/13/2013 1247   CREATININE 1.0 05/05/2013 0807   CALCIUM 9.4 07/19/2019 0900   CALCIUM 9.7 05/05/2013 0807   PROT 7.9 07/19/2019 0900   PROT 8.0 11/13/2013 1247   PROT 7.8 05/05/2013 0807   ALBUMIN 4.8 07/19/2019 0900   ALBUMIN 4.6 11/13/2013 1247   ALBUMIN 4.5 05/05/2013 0807   AST 32 07/19/2019 0900   AST 32 11/13/2013 1247   AST 27 05/05/2013 0807   ALT 20 07/19/2019 0900   ALT 31 11/13/2013 1247   ALT 19  05/05/2013 0807   ALKPHOS 59 07/19/2019 0900   ALKPHOS 58 11/13/2013 1247   ALKPHOS 79 05/05/2013 0807   BILITOT 0.9 07/19/2019 0900   BILITOT 0.7 11/13/2013 1247   BILITOT 0.64 05/05/2013 0807   GFRNONAA 58 (L) 07/19/2019 0900   GFRNONAA 53 (L) 11/13/2013 1247   GFRAA >60 07/19/2019 0900   GFRAA >60 11/13/2013 1247    No results found for: SPEP, UPEP  Lab Results  Component Value Date   WBC 6.2 07/19/2019   NEUTROABS 3.9 07/19/2019   HGB 13.5 07/19/2019   HCT 39.4 07/19/2019   MCV 88.7 07/19/2019   PLT 263 07/19/2019      Chemistry      Component Value Date/Time   NA 137 07/19/2019 0900   NA 144 05/05/2013 0807   K 3.6 07/19/2019 0900   K 3.8 05/05/2013 0807   CL 104 07/19/2019 0900   CL 105 05/06/2012 1433   CO2 24 07/19/2019 0900   CO2 25 05/05/2013 0807   BUN 17 07/19/2019 0900   BUN 15.6 05/05/2013 0807   CREATININE 0.95 07/19/2019 0900   CREATININE 1.06 11/13/2013 1247   CREATININE 1.0 05/05/2013 0807      Component Value Date/Time   CALCIUM 9.4 07/19/2019 0900   CALCIUM 9.7 05/05/2013 0807   ALKPHOS 59 07/19/2019 0900   ALKPHOS 58 11/13/2013 1247   ALKPHOS 79 05/05/2013 0807   AST 32 07/19/2019 0900   AST 32 11/13/2013 1247   AST 27 05/05/2013 0807   ALT 20 07/19/2019 0900   ALT 31 11/13/2013 1247   ALT 19 05/05/2013 0807   BILITOT 0.9 07/19/2019 0900   BILITOT 0.7 11/13/2013 1247   BILITOT 0.64 05/05/2013 0807       RADIOGRAPHIC STUDIES: I have personally reviewed the radiological images as listed and agreed with the findings in the report. No results found.   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  #Clinically no evidence of recurrence.  Stable.  Await mammogram in October 2020.  Continue as well.  Tolerating  well without any major side effects.  #Microscopic hematuria-s/p urology evaluation [cystoscopy CT scan]-question related to atrophic vaginitis.  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  Discussed that Arimidex could be discontinued if she is very symptomatic.  In general would recommend Arimidex until 2023.  #Left breast lumpectomy [more than 20 years ago]-?  Sutures.  Will discuss with surgery.  #DEXA scan Oct 2020 osteopenia T score -1.7-STABLE; continue Fosamax;  Continue the same for now.  # DISPOSITION:  # follow up in 12 months-MD/ labs- cbc/cmp/ Dexa scan prior  # LEFT  mammo in October 2021- Dr.B   Orders Placed This Encounter  Procedures  . DG Bone Density    Standing Status:   Future    Standing Expiration Date:   01/18/2021    Order Specific Question:   Reason for Exam (SYMPTOM  OR DIAGNOSIS REQUIRED)    Answer:   history of breast cancer / osteopenia    Order Specific Question:   Preferred imaging location?    Answer:   [facility]  . MM 3D SCREEN BREAST UNI LEFT    Standing Status:   Future    Standing Expiration Date:   07/18/2020    Order Specific Question:   Reason for Exam (SYMPTOM  OR DIAGNOSIS REQUIRED)    Answer:   history of breast cancer    Order Specific Question:   Preferred imaging location?    Answer:   [facility]  . CBC with Differential    Standing Status:   Future    Standing Expiration Date:   07/18/2020  . Comprehensive metabolic panel    Standing Status:   Future    Standing Expiration Date:   07/18/2020   All questions were answered. The patient knows to call the clinic with any problems, questions or concerns.      Cammie Sickle, MD 07/19/2019 12:47 PM

## 2019-07-20 ENCOUNTER — Telehealth: Payer: Self-pay | Admitting: Internal Medicine

## 2019-07-20 DIAGNOSIS — R921 Mammographic calcification found on diagnostic imaging of breast: Secondary | ICD-10-CM

## 2019-07-20 NOTE — Telephone Encounter (Signed)
Spoke to pt, re: with discussion with Dr.Byrnett. pt interested in surgical evaluation.  Please refer pt to Dr.Byrnett Dx: breast calcification [pt available after June 13th]

## 2019-07-20 NOTE — Addendum Note (Signed)
Addended by: Gloris Ham on: 07/20/2019 04:01 PM   Modules accepted: Orders

## 2019-07-20 NOTE — Telephone Encounter (Signed)
Madison Huerta, Please fax referral to Byrnett's office.

## 2019-07-27 ENCOUNTER — Other Ambulatory Visit: Payer: Self-pay | Admitting: *Deleted

## 2019-07-27 DIAGNOSIS — M8589 Other specified disorders of bone density and structure, multiple sites: Secondary | ICD-10-CM

## 2019-07-27 DIAGNOSIS — C50311 Malignant neoplasm of lower-inner quadrant of right female breast: Secondary | ICD-10-CM

## 2019-07-27 DIAGNOSIS — Z170 Estrogen receptor positive status [ER+]: Secondary | ICD-10-CM

## 2019-07-27 MED ORDER — ALENDRONATE SODIUM 70 MG PO TABS: ORAL_TABLET | ORAL | 1 refills | Status: DC

## 2019-07-27 MED ORDER — ANASTROZOLE 1 MG PO TABS: 1.0000 mg | ORAL_TABLET | Freq: Every day | ORAL | 4 refills | Status: DC

## 2019-08-08 DIAGNOSIS — Z170 Estrogen receptor positive status [ER+]: Secondary | ICD-10-CM | POA: Diagnosis not present

## 2019-08-08 DIAGNOSIS — C50311 Malignant neoplasm of lower-inner quadrant of right female breast: Secondary | ICD-10-CM | POA: Diagnosis not present

## 2019-08-30 ENCOUNTER — Telehealth: Payer: Self-pay | Admitting: Cardiovascular Disease

## 2019-08-30 NOTE — Telephone Encounter (Signed)
Declined recall doing fine.  Per request will delete.

## 2019-09-10 ENCOUNTER — Other Ambulatory Visit: Payer: Self-pay | Admitting: Family Medicine

## 2019-09-10 DIAGNOSIS — E039 Hypothyroidism, unspecified: Secondary | ICD-10-CM

## 2019-09-21 ENCOUNTER — Ambulatory Visit: Payer: Medicare Other | Admitting: Family Medicine

## 2019-09-26 ENCOUNTER — Other Ambulatory Visit: Payer: Self-pay

## 2019-09-27 ENCOUNTER — Encounter: Payer: Self-pay | Admitting: Family Medicine

## 2019-09-27 ENCOUNTER — Ambulatory Visit (INDEPENDENT_AMBULATORY_CARE_PROVIDER_SITE_OTHER): Payer: Medicare Other | Admitting: Family Medicine

## 2019-09-27 VITALS — BP 146/68 | HR 72 | Temp 97.8°F | Ht 60.0 in | Wt 129.2 lb

## 2019-09-27 DIAGNOSIS — E559 Vitamin D deficiency, unspecified: Secondary | ICD-10-CM

## 2019-09-27 DIAGNOSIS — Q210 Ventricular septal defect: Secondary | ICD-10-CM | POA: Diagnosis not present

## 2019-09-27 DIAGNOSIS — E039 Hypothyroidism, unspecified: Secondary | ICD-10-CM | POA: Diagnosis not present

## 2019-09-27 DIAGNOSIS — I10 Essential (primary) hypertension: Secondary | ICD-10-CM

## 2019-09-27 DIAGNOSIS — N029 Recurrent and persistent hematuria with unspecified morphologic changes: Secondary | ICD-10-CM

## 2019-09-27 LAB — TSH: TSH: 0.09 u[IU]/mL — ABNORMAL LOW (ref 0.35–4.50)

## 2019-09-27 LAB — COMPREHENSIVE METABOLIC PANEL
ALT: 15 U/L (ref 0–35)
AST: 25 U/L (ref 0–37)
Albumin: 4.6 g/dL (ref 3.5–5.2)
Alkaline Phosphatase: 59 U/L (ref 39–117)
BUN: 12 mg/dL (ref 6–23)
CO2: 25 mEq/L (ref 19–32)
Calcium: 9.7 mg/dL (ref 8.4–10.5)
Chloride: 103 mEq/L (ref 96–112)
Creatinine, Ser: 0.82 mg/dL (ref 0.40–1.20)
GFR: 67.55 mL/min (ref >60.00)
Glucose, Bld: 103 mg/dL — ABNORMAL HIGH (ref 70–99)
Potassium: 4 mEq/L (ref 3.5–5.1)
Sodium: 136 mEq/L (ref 135–145)
Total Bilirubin: 0.6 mg/dL (ref 0.2–1.2)
Total Protein: 7 g/dL (ref 6.0–8.3)

## 2019-09-27 LAB — CBC
HCT: 39.9 % (ref 36.0–46.0)
Hemoglobin: 13.2 g/dL (ref 12.0–15.0)
MCHC: 33.2 g/dL (ref 30.0–36.0)
MCV: 91.2 fl (ref 78.0–100.0)
Platelets: 267 10*3/uL (ref 150.0–400.0)
RBC: 4.37 Mil/uL (ref 3.87–5.11)
RDW: 13.6 % (ref 11.5–15.5)
WBC: 6 10*3/uL (ref 4.0–10.5)

## 2019-09-27 LAB — URINALYSIS, ROUTINE W REFLEX MICROSCOPIC
Bilirubin Urine: NEGATIVE
Ketones, ur: NEGATIVE
Leukocytes,Ua: NEGATIVE
Nitrite: NEGATIVE
Specific Gravity, Urine: 1.01 (ref 1.000–1.030)
Total Protein, Urine: NEGATIVE
Urine Glucose: NEGATIVE
Urobilinogen, UA: 0.2 (ref 0.0–1.0)
pH: 6 (ref 5.0–8.0)

## 2019-09-27 LAB — VITAMIN D 25 HYDROXY (VIT D DEFICIENCY, FRACTURES): VITD: 37.55 ng/mL (ref 30.00–100.00)

## 2019-09-27 NOTE — Progress Notes (Signed)
Established Patient Office Visit  Subjective:  Patient ID: Madison Huerta, female    DOB: May 02, 1940  Age: 79 y.o. MRN: [id number]  CC:  Chief Complaint  Patient presents with  . Follow-up    6 month follow up, no concerns.     HPI Madison Huerta presents for follow-up of hypertension, hypothyroidism, hematuria.  Has had no issues with the higher dose of levothyroxine.  She is taking it every morning before breakfast.  Blood pressure at home has been running in the 130s over 60s.  Oncology and urology both feel as though hematuria is secondary to her Arimidex.  We will continue to follow that every 6 months.  Oncology would like her to remain on this medication for another 2 years.  Taking her vitamin D daily.  Walking daily for exercise.  Past Medical History:  Diagnosis Date  . Breast cancer ([place]) 1994   L breast- lumpectomy, care for at [facility]   . Breast cancer [facility]) 2013   right breast- mastectomy, care at [facility]  . Cataract   . Endocarditis   . GERD (gastroesophageal reflux disease)   . Heart murmur    VSD- echocardiogram - [facility] 10 yrs. ago  . History of breast cancer   . Hyperlipidemia   . Hypertension   . Hypothyroidism   . Osteopenia   . Overweight(278.02)   . Personal history of radiation therapy 1994   left breast ca  . Thyroid disease    Hypothyroidism  . VSD (ventricular septal defect)    does need SBE prophylaxis (Keflex  3 gm 1 hour pprior and 1.5 gm 6 hours post  . VSD (ventricular septal defect)     Past Surgical History:  Procedure Laterality Date  . 2D echo  2004   Normal at [facility]  . AUGMENTATION MAMMAPLASTY Bilateral    removed 1994  . BREAST BIOPSY Left 1994   breast ca  . BREAST BIOPSY Right 2013   breast ca  . BREAST IMPLANT REMOVAL  1994  . BREAST LUMPECTOMY Left 1994   lumpectomy and rad tx and tamoxifen  . MASTECTOMY Right 01/08/2012   complete and anastrozole  . MASTECTOMY W/ SENTINEL NODE BIOPSY  01/08/2012   Procedure: MASTECTOMY WITH  SENTINEL LYMPH NODE BIOPSY;  Surgeon: Madilyn Hook, DO;  Location: [facility];  Service: General;  Laterality: Right;  right breast mastectomy with sentinel lymph node biopsy   . PARATHYROIDECTOMY  12/2002  . TONSILLECTOMY     As a child  . TUBAL LIGATION     1976    Family History  Problem Relation Age of Onset  . Aneurysm Mother        brain  . Cancer Father        Lung, Liver mets  . Lung cancer Father 61  . Heart disease Other   . Cancer Cousin        Breast CA  . Breast cancer Cousin 30  . Breast cancer Maternal Aunt 51    Social History   Socioeconomic History  . Marital status: Married    Spouse name: Not on file  . Number of children: 2  . Years of education: Not on file  . Highest education level: Not on file  Occupational History  . Occupation: Animator: UNEMPLOYED  Tobacco Use  . Smoking status: Never Smoker  . Smokeless tobacco: Never Used  Vaping Use  . Vaping Use: Never used  Substance and Sexual  Activity  . Alcohol use: Yes    Alcohol/week: 3.0 standard drinks    Types: 3 Glasses of wine per week  . Drug use: No  . Sexual activity: Never  Other Topics Concern  . Not on file  Social History Narrative  . Not on file   Social Determinants of Health   Financial Resource Strain: Low Risk   . Difficulty of Paying Living Expenses: Not hard at all  Food Insecurity: No Food Insecurity  . Worried About Charity fundraiser in the Last Year: Never true  . Ran Out of Food in the Last Year: Never true  Transportation Needs: No Transportation Needs  . Lack of Transportation (Medical): No  . Lack of Transportation (Non-Medical): No  Physical Activity:   . Days of Exercise per Week:   . Minutes of Exercise per Session:   Stress:   . Feeling of Stress :   Social Connections:   . Frequency of Communication with Friends and Family:   . Frequency of Social Gatherings with Friends and Family:   . Attends Religious Services:   . Active Member  of Clubs or Organizations:   . Attends Archivist Meetings:   Marland Kitchen Marital Status:   Intimate Partner Violence:   . Fear of Current or Ex-Partner:   . Emotionally Abused:   Marland Kitchen Physically Abused:   . Sexually Abused:     Outpatient Medications Prior to Visit  Medication Sig Dispense Refill  . acetaminophen (TYLENOL) 500 MG tablet Take 500 mg by mouth every 4 (four) hours as needed for fever.    Marland Kitchen alendronate (FOSAMAX) 70 MG tablet TAKE 1 TABLET BY MOUTH ONCE A WEEK, TAKE WITH FULL GLASS OF WATER ON AN EMPTY STOMACH 12 tablet 1  . amLODipine (NORVASC) 5 MG tablet Take 1 tablet (5 mg total) by mouth daily. 90 tablet 3  . anastrozole (ARIMIDEX) 1 MG tablet Take 1 tablet (1 mg total) by mouth daily. 90 tablet 4  . cholecalciferol (VITAMIN D) 1000 units tablet Take 1,000 Units by mouth daily.    Marland Kitchen levothyroxine (SYNTHROID) 75 MCG tablet TAKE 1 TABLET(75 MCG) BY MOUTH DAILY 90 tablet 0  . rosuvastatin (CRESTOR) 10 MG tablet Take 1 tablet (10 mg total) by mouth daily. 90 tablet 3   No facility-administered medications prior to visit.    Allergies  Allergen Reactions  . Codeine Other (See Comments)    Reaction:  Severe headaches   . Penicillins Hives and Other (See Comments)    Has patient had a PCN reaction causing immediate rash, facial/tongue/throat swelling, SOB or lightheadedness with hypotension: No Has patient had a PCN reaction causing severe rash involving mucus membranes or skin necrosis: No Has patient had a PCN reaction that required hospitalization No Has patient had a PCN reaction occurring within the last 10 years: No If all of the above answers are "NO", then may proceed with Cephalosporin use.  . Adhesive [Tape] Rash    ROS Review of Systems  Constitutional: Negative.   HENT: Negative.   Eyes: Negative for photophobia and visual disturbance.  Respiratory: Negative.   Cardiovascular: Negative.   Gastrointestinal: Negative.   Endocrine: Negative for polyphagia  and polyuria.  Genitourinary: Negative for dysuria, frequency and hematuria.  Musculoskeletal: Negative for gait problem and joint swelling.  Skin: Negative for pallor and rash.  Allergic/Immunologic: Negative for immunocompromised state.  Neurological: Negative for speech difficulty and light-headedness.  Hematological: Does not bruise/bleed easily.  Psychiatric/Behavioral: Negative.  Objective:    Physical Exam Vitals and nursing note reviewed.  Constitutional:      General: She is not in acute distress.    Appearance: Normal appearance. She is normal weight. She is not ill-appearing, toxic-appearing or diaphoretic.  HENT:     Head: Normocephalic and atraumatic.     Right Ear: Tympanic membrane, ear canal and external ear normal.     Left Ear: Tympanic membrane, ear canal and external ear normal.     Mouth/Throat:     Mouth: Mucous membranes are moist.     Pharynx: Oropharynx is clear. No oropharyngeal exudate or posterior oropharyngeal erythema.  Eyes:     General: No scleral icterus.       Right eye: No discharge.        Left eye: No discharge.     Extraocular Movements: Extraocular movements intact.     Conjunctiva/sclera: Conjunctivae normal.     Pupils: Pupils are equal, round, and reactive to light.  Cardiovascular:     Rate and Rhythm: Normal rate and regular rhythm.     Heart sounds: Murmur heard.   Pulmonary:     Effort: Pulmonary effort is normal.     Breath sounds: Normal breath sounds.  Abdominal:     General: Bowel sounds are normal.  Musculoskeletal:     Cervical back: No rigidity or tenderness.  Lymphadenopathy:     Cervical: No cervical adenopathy.  Skin:    General: Skin is warm and dry.  Neurological:     Mental Status: She is alert and oriented to person, place, and time.  Psychiatric:        Mood and Affect: Mood normal.        Behavior: Behavior normal.     BP (!) 146/68   Pulse 72   Temp 97.8 F (36.6 C) (Tympanic)   Ht 5' (1.524  m)   Wt 129 lb 3.2 oz (58.6 kg)   SpO2 98%   BMI 25.23 kg/m  Wt Readings from Last 3 Encounters:  09/27/19 129 lb 3.2 oz (58.6 kg)  07/19/19 127 lb 9.6 oz (57.9 kg)  03/23/19 128 lb 3.2 oz (58.2 kg)     Health Maintenance Due  Topic Date Due  . Hepatitis C Screening  Never done  . INFLUENZA VACCINE  09/24/2019    There are no preventive care reminders to display for this patient.  Lab Results  Component Value Date   TSH 0.39 05/19/2019   Lab Results  Component Value Date   WBC 6.2 07/19/2019   HGB 13.5 07/19/2019   HCT 39.4 07/19/2019   MCV 88.7 07/19/2019   PLT 263 07/19/2019   Lab Results  Component Value Date   NA 137 07/19/2019   K 3.6 07/19/2019   CHLORIDE 108 05/05/2013   CO2 24 07/19/2019   GLUCOSE 94 07/19/2019   BUN 17 07/19/2019   CREATININE 0.95 07/19/2019   BILITOT 0.9 07/19/2019   ALKPHOS 59 07/19/2019   AST 32 07/19/2019   ALT 20 07/19/2019   PROT 7.9 07/19/2019   ALBUMIN 4.8 07/19/2019   CALCIUM 9.4 07/19/2019   ANIONGAP 9 07/19/2019   GFR 57.05 (L) 05/19/2019   Lab Results  Component Value Date   CHOL 192 09/12/2018   Lab Results  Component Value Date   HDL 75.60 09/12/2018   Lab Results  Component Value Date   LDLCALC 90 09/12/2018   Lab Results  Component Value Date   TRIG 132.0 09/12/2018  Lab Results  Component Value Date   CHOLHDL 3 09/12/2018   Lab Results  Component Value Date   HGBA1C 5.9 03/04/2018      Assessment & Plan:   Problem List Items Addressed This Visit      Cardiovascular and Mediastinum   Essential hypertension - Primary   Relevant Orders   CBC   Comprehensive metabolic panel   VSD (ventricular septal defect)     Endocrine   Hypothyroidism   Relevant Orders   TSH     Genitourinary   Benign hematuria   Relevant Orders   Urinalysis, Routine w reflex microscopic     Other   Vitamin D deficiency   Relevant Orders   VITAMIN D 25 Hydroxy (Vit-D Deficiency, Fractures)      No orders  of the defined types were placed in this encounter.   Follow-up: Return in about 6 months (around 03/29/2020).   Continue all medications.  May need to adjust  Synthroid. Continue other meds at current dose.  Libby Maw, MD

## 2019-09-28 NOTE — Progress Notes (Signed)
Thyroid appears to be over replaced.  Please take levothyroxine 4mcg daily in the morning one hour prior to eating Monday per Saturday.   Vit D levels are okay on current dose. There is a small amount of blood in the urine.  Please follow up with me in 2 months.

## 2019-10-23 ENCOUNTER — Ambulatory Visit: Payer: Medicare Other | Admitting: Cardiovascular Disease

## 2019-11-29 ENCOUNTER — Other Ambulatory Visit: Payer: Self-pay

## 2019-11-29 ENCOUNTER — Ambulatory Visit: Payer: Medicare Other | Admitting: Family Medicine

## 2019-11-30 ENCOUNTER — Encounter: Payer: Self-pay | Admitting: Family Medicine

## 2019-11-30 ENCOUNTER — Ambulatory Visit (INDEPENDENT_AMBULATORY_CARE_PROVIDER_SITE_OTHER): Payer: Medicare Other | Admitting: Family Medicine

## 2019-11-30 VITALS — BP 148/64 | HR 74 | Temp 97.4°F | Ht 60.0 in | Wt 127.4 lb

## 2019-11-30 DIAGNOSIS — I10 Essential (primary) hypertension: Secondary | ICD-10-CM

## 2019-11-30 DIAGNOSIS — E039 Hypothyroidism, unspecified: Secondary | ICD-10-CM | POA: Diagnosis not present

## 2019-11-30 NOTE — Progress Notes (Signed)
Established Patient Office Visit  Subjective:  Patient ID: Madison Huerta, female    DOB: 07-14-1940  Age: 79 y.o. MRN: [id number]  CC:  Chief Complaint  Patient presents with   Follow-up    follow up on labs, patient not sure if she should have booster vaccine.     HPI Madison Huerta presents for follow-up of her depressed TSH level.  She has been taking her new dose of 75 mcg daily in the morning fasting.  Oncologist 10 urologist have both mentioned that Arimidex could lead to microhematuria.  She has never smoked.  Patient has never smoked.  Blood pressure at home is consistently in the 130/70 range.  Past Medical History:  Diagnosis Date   Breast cancer ([place]) 1994   L breast- lumpectomy, care for at [facility]) 2013   right breast- mastectomy, care at [facility]   Cataract    Endocarditis    GERD (gastroesophageal reflux disease)    Heart murmur    VSD- echocardiogram - [facility] 10 yrs. ago   History of breast cancer    Hyperlipidemia    Hypertension    Hypothyroidism    Osteopenia    Overweight(278.02)    Personal history of radiation therapy 1994   left breast ca   Thyroid disease    Hypothyroidism   VSD (ventricular septal defect)    does need SBE prophylaxis (Keflex  3 gm 1 hour pprior and 1.5 gm 6 hours post   VSD (ventricular septal defect)     Past Surgical History:  Procedure Laterality Date   2D echo  2004   Normal at [facility] Bilateral    removed [place] Left 1994   breast ca   BREAST BIOPSY Right 2013   breast ca   BREAST IMPLANT REMOVAL  1994   BREAST LUMPECTOMY Left 1994   lumpectomy and rad tx and tamoxifen   MASTECTOMY Right 01/08/2012   complete and anastrozole   MASTECTOMY W/ SENTINEL NODE BIOPSY  01/08/2012   Procedure: MASTECTOMY WITH SENTINEL LYMPH NODE BIOPSY;  Surgeon: Madilyn Hook, DO;  Location: [facility];  Service: General;  Laterality: Right;  right breast  mastectomy with sentinel lymph node biopsy    PARATHYROIDECTOMY  12/2002   TONSILLECTOMY     As a child   TUBAL LIGATION     1976    Family History  Problem Relation Age of Onset   Aneurysm Mother        brain   Cancer Father        Lung, Liver mets   Lung cancer Father 32   Heart disease Other    Cancer Cousin        Breast CA   Breast cancer Cousin 30   Breast cancer Maternal Aunt 13    Social History   Socioeconomic History   Marital status: Married    Spouse name: Not on file   Number of children: 2   Years of education: Not on file   Highest education level: Not on file  Occupational History   Occupation: Animator: UNEMPLOYED  Tobacco Use   Smoking status: Never Smoker   Smokeless tobacco: Never Used  Scientific laboratory technician Use: Never used  Substance and Sexual Activity   Alcohol use: Yes    Alcohol/week: 3.0 standard drinks    Types: 3 Glasses of wine per week  Drug use: No   Sexual activity: Never  Other Topics Concern   Not on file  Social History Narrative   Not on file   Social Determinants of Health   Financial Resource Strain: Low Risk    Difficulty of Paying Living Expenses: Not hard at all  Food Insecurity: No Food Insecurity   Worried About Charity fundraiser in the Last Year: Never true   [place] in the Last Year: Never true  Transportation Needs: No Transportation Needs   Lack of Transportation (Medical): No   Lack of Transportation (Non-Medical): No  Physical Activity:    Days of Exercise per Week: Not on file   Minutes of Exercise per Session: Not on file  Stress:    Feeling of Stress : Not on file  Social Connections:    Frequency of Communication with Friends and Family: Not on file   Frequency of Social Gatherings with Friends and Family: Not on file   Attends Religious Services: Not on file   Active Member of Clubs or Organizations: Not on file   Attends Theatre manager Meetings: Not on file   Marital Status: Not on file  Intimate Partner Violence:    Fear of Current or Ex-Partner: Not on file   Emotionally Abused: Not on file   Physically Abused: Not on file   Sexually Abused: Not on file    Outpatient Medications Prior to Visit  Medication Sig Dispense Refill   acetaminophen (TYLENOL) 500 MG tablet Take 500 mg by mouth every 4 (four) hours as needed for fever.     alendronate (FOSAMAX) 70 MG tablet TAKE 1 TABLET BY MOUTH ONCE A WEEK, TAKE WITH FULL GLASS OF WATER ON AN EMPTY STOMACH 12 tablet 1   amLODipine (NORVASC) 5 MG tablet Take 1 tablet (5 mg total) by mouth daily. 90 tablet 3   anastrozole (ARIMIDEX) 1 MG tablet Take 1 tablet (1 mg total) by mouth daily. 90 tablet 4   cholecalciferol (VITAMIN D) 1000 units tablet Take 1,000 Units by mouth daily.     levothyroxine (SYNTHROID) 75 MCG tablet TAKE 1 TABLET(75 MCG) BY MOUTH DAILY 90 tablet 0   rosuvastatin (CRESTOR) 10 MG tablet Take 1 tablet (10 mg total) by mouth daily. 90 tablet 3   No facility-administered medications prior to visit.    Allergies  Allergen Reactions   Codeine Other (See Comments)    Reaction:  Severe headaches    Penicillins Hives and Other (See Comments)    Has patient had a PCN reaction causing immediate rash, facial/tongue/throat swelling, SOB or lightheadedness with hypotension: No Has patient had a PCN reaction causing severe rash involving mucus membranes or skin necrosis: No Has patient had a PCN reaction that required hospitalization No Has patient had a PCN reaction occurring within the last 10 years: No If all of the above answers are "NO", then may proceed with Cephalosporin use.   Adhesive [Tape] Rash    ROS Review of Systems  Constitutional: Negative.   HENT: Negative.   Respiratory: Negative.   Cardiovascular: Negative for palpitations.  Gastrointestinal: Negative.   Endocrine: Negative for polyphagia and polyuria.    Genitourinary: Negative for difficulty urinating, dysuria and hematuria.  Musculoskeletal: Negative for joint swelling and myalgias.  Allergic/Immunologic: Negative for immunocompromised state.  Neurological: Negative for headaches.  Hematological: Does not bruise/bleed easily.  Psychiatric/Behavioral: Negative.       Objective:    Physical Exam Vitals and nursing note reviewed.  Constitutional:      General: She is not in acute distress.    Appearance: Normal appearance. She is normal weight. She is not ill-appearing, toxic-appearing or diaphoretic.  HENT:     Head: Normocephalic and atraumatic.  Eyes:     General: No scleral icterus.       Right eye: No discharge.        Left eye: No discharge.     Conjunctiva/sclera: Conjunctivae normal.     Pupils: Pupils are equal, round, and reactive to light.  Cardiovascular:     Rate and Rhythm: Normal rate and regular rhythm.     Heart sounds: Murmur heard.   Pulmonary:     Effort: Pulmonary effort is normal.     Breath sounds: Normal breath sounds.  Musculoskeletal:     Cervical back: No rigidity or tenderness.     Right lower leg: No edema.     Left lower leg: No edema.  Lymphadenopathy:     Cervical: No cervical adenopathy.  Skin:    General: Skin is warm and dry.  Neurological:     Mental Status: She is alert and oriented to person, place, and time.  Psychiatric:        Mood and Affect: Mood normal.        Behavior: Behavior normal.     BP (!) 148/64    Pulse 74    Temp (!) 97.4 F (36.3 C) (Tympanic)    Ht 5' (1.524 m)    Wt 127 lb 6.4 oz (57.8 kg)    SpO2 98%    BMI 24.88 kg/m  Wt Readings from Last 3 Encounters:  11/30/19 127 lb 6.4 oz (57.8 kg)  09/27/19 129 lb 3.2 oz (58.6 kg)  07/19/19 127 lb 9.6 oz (57.9 kg)     Health Maintenance Due  Topic Date Due   Hepatitis C Screening  Never done   INFLUENZA VACCINE  09/24/2019    There are no preventive care reminders to display for this patient.  Lab  Results  Component Value Date   TSH 0.09 (L) 09/27/2019   Lab Results  Component Value Date   WBC 6.0 09/27/2019   HGB 13.2 09/27/2019   HCT 39.9 09/27/2019   MCV 91.2 09/27/2019   PLT 267.0 09/27/2019   Lab Results  Component Value Date   NA 136 09/27/2019   K 4.0 09/27/2019   CHLORIDE 108 05/05/2013   CO2 25 09/27/2019   GLUCOSE 103 (H) 09/27/2019   BUN 12 09/27/2019   CREATININE 0.82 09/27/2019   BILITOT 0.6 09/27/2019   ALKPHOS 59 09/27/2019   AST 25 09/27/2019   ALT 15 09/27/2019   PROT 7.0 09/27/2019   ALBUMIN 4.6 09/27/2019   CALCIUM 9.7 09/27/2019   ANIONGAP 9 07/19/2019   GFR 67.55 09/27/2019   Lab Results  Component Value Date   CHOL 192 09/12/2018   Lab Results  Component Value Date   HDL 75.60 09/12/2018   Lab Results  Component Value Date   LDLCALC 90 09/12/2018   Lab Results  Component Value Date   TRIG 132.0 09/12/2018   Lab Results  Component Value Date   CHOLHDL 3 09/12/2018   Lab Results  Component Value Date   HGBA1C 5.9 03/04/2018      Assessment & Plan:   Problem List Items Addressed This Visit      Cardiovascular and Mediastinum   Essential hypertension - Primary     Endocrine   Hypothyroidism  Relevant Orders   TSH      No orders of the defined types were placed in this encounter.   Follow-up: Return in about 6 months (around 05/30/2020), or return next week for labs..  Levothyroxine adjusted pending today's TSH.  Continue current dose of amlodipine.  Follow-up with urology next month.  Libby Maw, MD

## 2019-12-05 ENCOUNTER — Other Ambulatory Visit: Payer: Self-pay

## 2019-12-06 ENCOUNTER — Other Ambulatory Visit (INDEPENDENT_AMBULATORY_CARE_PROVIDER_SITE_OTHER): Payer: Medicare Other

## 2019-12-06 DIAGNOSIS — E039 Hypothyroidism, unspecified: Secondary | ICD-10-CM

## 2019-12-06 LAB — TSH: TSH: 0.44 u[IU]/mL (ref 0.35–4.50)

## 2019-12-07 ENCOUNTER — Other Ambulatory Visit: Payer: Self-pay

## 2019-12-07 ENCOUNTER — Ambulatory Visit
Admission: RE | Admit: 2019-12-07 | Discharge: 2019-12-07 | Disposition: A | Discharge status: Home / Self Care | Payer: Medicare Other | Source: Ambulatory Visit | Attending: Internal Medicine | Admitting: Internal Medicine

## 2019-12-07 DIAGNOSIS — Z1231 Encounter for screening mammogram for malignant neoplasm of breast: Secondary | ICD-10-CM | POA: Insufficient documentation

## 2019-12-07 DIAGNOSIS — C50311 Malignant neoplasm of lower-inner quadrant of right female breast: Secondary | ICD-10-CM | POA: Insufficient documentation

## 2019-12-07 DIAGNOSIS — Z170 Estrogen receptor positive status [ER+]: Secondary | ICD-10-CM | POA: Insufficient documentation

## 2019-12-07 MED ORDER — LEVOTHYROXINE SODIUM 75 MCG PO TABS: 75.0000 ug | ORAL_TABLET | Freq: Every day | ORAL | 3 refills | Status: DC

## 2019-12-07 NOTE — Addendum Note (Signed)
Addended by: Jon Billings on: 12/07/2019 08:16 AM   Modules accepted: Orders

## 2019-12-18 ENCOUNTER — Other Ambulatory Visit: Payer: Self-pay | Admitting: Family Medicine

## 2019-12-18 DIAGNOSIS — E039 Hypothyroidism, unspecified: Secondary | ICD-10-CM

## 2020-01-02 ENCOUNTER — Telehealth: Payer: Self-pay | Admitting: *Deleted

## 2020-01-02 DIAGNOSIS — C50311 Malignant neoplasm of lower-inner quadrant of right female breast: Secondary | ICD-10-CM

## 2020-01-02 DIAGNOSIS — M8580 Other specified disorders of bone density and structure, unspecified site: Secondary | ICD-10-CM

## 2020-01-02 NOTE — Telephone Encounter (Signed)
-----   Message from Cammie Sickle, MD sent at 01/02/2020  9:41 AM EST ----- Regarding: follow up # DISPOSITION:  # follow up in may 2022--MD/ labs- cbc/cmp/ Dexa scan prior -Thanks GB

## 2020-01-02 NOTE — Telephone Encounter (Signed)
Bone density orders entered. Colette, please schedule.

## 2020-01-28 ENCOUNTER — Other Ambulatory Visit: Payer: Self-pay | Admitting: Internal Medicine

## 2020-01-28 DIAGNOSIS — M8589 Other specified disorders of bone density and structure, multiple sites: Secondary | ICD-10-CM

## 2020-01-28 DIAGNOSIS — C50311 Malignant neoplasm of lower-inner quadrant of right female breast: Secondary | ICD-10-CM

## 2020-03-21 ENCOUNTER — Other Ambulatory Visit: Payer: Self-pay

## 2020-03-21 DIAGNOSIS — I10 Essential (primary) hypertension: Secondary | ICD-10-CM

## 2020-03-21 MED ORDER — AMLODIPINE BESYLATE 5 MG PO TABS: 5.0000 mg | ORAL_TABLET | Freq: Every day | ORAL | 0 refills | Status: DC

## 2020-03-21 NOTE — Telephone Encounter (Signed)
Pt has been rescheduled. Last OV 09/27/19 Last fill 03/23/19  #90/3

## 2020-04-01 ENCOUNTER — Ambulatory Visit: Payer: Medicare Other | Admitting: Family Medicine

## 2020-04-09 ENCOUNTER — Telehealth: Payer: Self-pay | Admitting: Family Medicine

## 2020-04-09 NOTE — Telephone Encounter (Signed)
Left message for patient to schedule Annual Wellness Visit.  Please schedule with Nurse Health Advisor Martha Stanley, RN at [facility]  °

## 2020-05-02 ENCOUNTER — Ambulatory Visit: Payer: Medicare Other | Admitting: Family Medicine

## 2020-05-24 ENCOUNTER — Other Ambulatory Visit: Payer: Self-pay

## 2020-05-24 ENCOUNTER — Ambulatory Visit (INDEPENDENT_AMBULATORY_CARE_PROVIDER_SITE_OTHER): Payer: Medicare Other | Admitting: Family Medicine

## 2020-05-24 ENCOUNTER — Encounter: Payer: Self-pay | Admitting: Family Medicine

## 2020-05-24 VITALS — BP 150/89 | HR 87 | Temp 97.6°F | Wt 127.2 lb

## 2020-05-24 DIAGNOSIS — E039 Hypothyroidism, unspecified: Secondary | ICD-10-CM | POA: Diagnosis not present

## 2020-05-24 DIAGNOSIS — E782 Mixed hyperlipidemia: Secondary | ICD-10-CM | POA: Diagnosis not present

## 2020-05-24 DIAGNOSIS — I10 Essential (primary) hypertension: Secondary | ICD-10-CM | POA: Diagnosis not present

## 2020-05-24 DIAGNOSIS — E559 Vitamin D deficiency, unspecified: Secondary | ICD-10-CM | POA: Diagnosis not present

## 2020-05-24 DIAGNOSIS — F4321 Adjustment disorder with depressed mood: Secondary | ICD-10-CM | POA: Diagnosis not present

## 2020-05-24 DIAGNOSIS — Q210 Ventricular septal defect: Secondary | ICD-10-CM

## 2020-05-24 LAB — URINALYSIS, ROUTINE W REFLEX MICROSCOPIC
Bilirubin Urine: NEGATIVE
Ketones, ur: 15 — AB
Leukocytes,Ua: NEGATIVE
Nitrite: NEGATIVE
Specific Gravity, Urine: 1.01 (ref 1.000–1.030)
Total Protein, Urine: NEGATIVE
Urine Glucose: NEGATIVE
Urobilinogen, UA: 0.2 (ref 0.0–1.0)
WBC, UA: NONE SEEN (ref 0–?)
pH: 5.5 (ref 5.0–8.0)

## 2020-05-24 LAB — COMPREHENSIVE METABOLIC PANEL
ALT: 13 U/L (ref 0–35)
AST: 25 U/L (ref 0–37)
Albumin: 4.9 g/dL (ref 3.5–5.2)
Alkaline Phosphatase: 71 U/L (ref 39–117)
BUN: 12 mg/dL (ref 6–23)
CO2: 26 mEq/L (ref 19–32)
Calcium: 9.8 mg/dL (ref 8.4–10.5)
Chloride: 102 mEq/L (ref 96–112)
Creatinine, Ser: 0.82 mg/dL (ref 0.40–1.20)
GFR: 68.74 mL/min (ref >60.00)
Glucose, Bld: 100 mg/dL — ABNORMAL HIGH (ref 70–99)
Potassium: 3.7 mEq/L (ref 3.5–5.1)
Sodium: 139 mEq/L (ref 135–145)
Total Bilirubin: 0.8 mg/dL (ref 0.2–1.2)
Total Protein: 7.5 g/dL (ref 6.0–8.3)

## 2020-05-24 LAB — LIPID PANEL
Cholesterol: 226 mg/dL — ABNORMAL HIGH (ref 0–200)
HDL: 82.5 mg/dL (ref >39.00)
LDL Cholesterol: 126 mg/dL — ABNORMAL HIGH (ref 0–99)
NonHDL: 143.08
Total CHOL/HDL Ratio: 3
Triglycerides: 86 mg/dL (ref 0.0–149.0)
VLDL: 17.2 mg/dL (ref 0.0–40.0)

## 2020-05-24 LAB — CBC
HCT: 41.2 % (ref 36.0–46.0)
Hemoglobin: 14.1 g/dL (ref 12.0–15.0)
MCHC: 34.3 g/dL (ref 30.0–36.0)
MCV: 92.1 fl (ref 78.0–100.0)
Platelets: 280 10*3/uL (ref 150.0–400.0)
RBC: 4.47 Mil/uL (ref 3.87–5.11)
RDW: 12.8 % (ref 11.5–15.5)
WBC: 5.4 10*3/uL (ref 4.0–10.5)

## 2020-05-24 LAB — VITAMIN D 25 HYDROXY (VIT D DEFICIENCY, FRACTURES): VITD: 41.13 ng/mL (ref 30.00–100.00)

## 2020-05-24 LAB — TSH: TSH: 1.24 u[IU]/mL (ref 0.35–4.50)

## 2020-05-24 NOTE — Progress Notes (Signed)
Established Patient Office Visit  Subjective:  Patient ID: Madison Huerta, female    DOB: [birth year]  Age: 80 y.o. MRN: [id number]  CC:  Chief Complaint  Patient presents with  . Follow-up    6 mo follow up with physical,    HPI Madison Huerta presents for follow-up of hypertension, elevated cholesterol, hypothyroidism and vitamin D deficiency.  Blood pressure has been well controlled with amlodipine.  She is having no issues taking.  Continues rosuvastatin and 1000 units of vitamin D daily for her cholesterol and vitamin D deficiency.  Continues with levothyroxine 75 mcg on a fasting stomach 1 hour before eating.  She has having no issues taking these medications.  Of importance she lost her husband back first part of February.  He had been in the hospital for 80 days status post abdominal aneurysm surgery.  Of course her heart is broken she continues to grieve.  They were to have been married for some 56 years.  She has a daughter who lives close by.  Past Medical History:  Diagnosis Date  . Breast cancer ([place]) 1994   L breast- lumpectomy, care for at [facility]   . Breast cancer [facility]) 2013   right breast- mastectomy, care at [facility]  . Cataract   . Endocarditis   . GERD (gastroesophageal reflux disease)   . Heart murmur    VSD- echocardiogram - [facility] 10 yrs. ago  . History of breast cancer   . Hyperlipidemia   . Hypertension   . Hypothyroidism   . Osteopenia   . Overweight(278.02)   . Personal history of radiation therapy 1994   left breast ca  . Thyroid disease    Hypothyroidism  . VSD (ventricular septal defect)    does need SBE prophylaxis (Keflex  3 gm 1 hour pprior and 1.5 gm 6 hours post  . VSD (ventricular septal defect)     Past Surgical History:  Procedure Laterality Date  . 2D echo  2004   Normal at [facility]  . AUGMENTATION MAMMAPLASTY Bilateral    removed 1994  . BREAST BIOPSY Left 1994   breast ca  . BREAST BIOPSY Right 2013   breast ca  . BREAST IMPLANT REMOVAL   1994  . BREAST LUMPECTOMY Left 1994   lumpectomy and rad tx and tamoxifen  . MASTECTOMY Right 01/08/2012   complete and anastrozole  . MASTECTOMY W/ SENTINEL NODE BIOPSY  01/08/2012   Procedure: MASTECTOMY WITH SENTINEL LYMPH NODE BIOPSY;  Surgeon: Madilyn Hook, DO;  Location: [place];  Service: General;  Laterality: Right;  right breast mastectomy with sentinel lymph node biopsy   . PARATHYROIDECTOMY  12/2002  . TONSILLECTOMY     As a child  . TUBAL LIGATION     1976    Family History  Problem Relation Age of Onset  . Aneurysm Mother        brain  . Cancer Father        Lung, Liver mets  . Lung cancer Father 38  . Heart disease Other   . Cancer Cousin        Breast CA  . Breast cancer Cousin 30  . Breast cancer Maternal Aunt 48    Social History   Socioeconomic History  . Marital status: Married    Spouse name: Not on file  . Number of children: 2  . Years of education: Not on file  . Highest education level: Not on file  Occupational History  .  Occupation: Animator: UNEMPLOYED  Tobacco Use  . Smoking status: Never Smoker  . Smokeless tobacco: Never Used  Vaping Use  . Vaping Use: Never used  Substance and Sexual Activity  . Alcohol use: Yes    Alcohol/week: 3.0 standard drinks    Types: 3 Glasses of wine per week  . Drug use: No  . Sexual activity: Never  Other Topics Concern  . Not on file  Social History Narrative  . Not on file   Social Determinants of Health   Financial Resource Strain: Not on file  Food Insecurity: Not on file  Transportation Needs: Not on file  Physical Activity: Not on file  Stress: Not on file  Social Connections: Not on file  Intimate Partner Violence: Not on file    Outpatient Medications Prior to Visit  Medication Sig Dispense Refill  . acetaminophen (TYLENOL) 500 MG tablet Take 500 mg by mouth every 4 (four) hours as needed for fever.    Marland Kitchen alendronate (FOSAMAX) 70 MG tablet TAKE 1 TABLET BY MOUTH 1  TIME A WEEK WITH FULL GLASS OF WATER AND ON AN EMPTY STOMACH 12 tablet 1  . amLODipine (NORVASC) 5 MG tablet Take 1 tablet (5 mg total) by mouth daily. 90 tablet 0  . anastrozole (ARIMIDEX) 1 MG tablet Take 1 tablet (1 mg total) by mouth daily. 90 tablet 4  . cholecalciferol (VITAMIN D) 1000 units tablet Take 1,000 Units by mouth daily.    Marland Kitchen levothyroxine (SYNTHROID) 75 MCG tablet TAKE 1 TABLET(75 MCG) BY MOUTH DAILY 90 tablet 3  . rosuvastatin (CRESTOR) 10 MG tablet Take 1 tablet (10 mg total) by mouth daily. 90 tablet 3   No facility-administered medications prior to visit.    Allergies  Allergen Reactions  . Codeine Other (See Comments)    Reaction:  Severe headaches   . Penicillins Hives and Other (See Comments)    Has patient had a PCN reaction causing immediate rash, facial/tongue/throat swelling, SOB or lightheadedness with hypotension: No Has patient had a PCN reaction causing severe rash involving mucus membranes or skin necrosis: No Has patient had a PCN reaction that required hospitalization No Has patient had a PCN reaction occurring within the last 10 years: No If all of the above answers are "NO", then may proceed with Cephalosporin use.  . Adhesive [Tape] Rash    ROS Review of Systems  Constitutional: Negative.   HENT: Negative.   Eyes: Negative for photophobia and visual disturbance.  Respiratory: Negative.   Cardiovascular: Negative.   Endocrine: Negative for polyphagia and polyuria.  Genitourinary: Negative.   Musculoskeletal: Negative.   Skin: Negative.   Neurological: Negative for speech difficulty and weakness.  Psychiatric/Behavioral: Positive for dysphoric mood.      Objective:    Physical Exam Vitals and nursing note reviewed.  Constitutional:      General: She is not in acute distress.    Appearance: Normal appearance. She is normal weight. She is not ill-appearing, toxic-appearing or diaphoretic.  HENT:     Head: Normocephalic and atraumatic.      Right Ear: Tympanic membrane, ear canal and external ear normal.     Left Ear: Tympanic membrane, ear canal and external ear normal.     Mouth/Throat:     Mouth: Mucous membranes are moist.     Pharynx: Oropharynx is clear. No oropharyngeal exudate or posterior oropharyngeal erythema.  Eyes:     General: No scleral icterus.    Extraocular  Movements: Extraocular movements intact.     Conjunctiva/sclera: Conjunctivae normal.     Pupils: Pupils are equal, round, and reactive to light.  Neck:     Vascular: No carotid bruit.  Cardiovascular:     Rate and Rhythm: Normal rate and regular rhythm.     Heart sounds: Murmur heard.    Pulmonary:     Effort: Pulmonary effort is normal.     Breath sounds: Normal breath sounds.  Abdominal:     General: Bowel sounds are normal.  Musculoskeletal:     Cervical back: No rigidity or tenderness.     Right lower leg: No edema.     Left lower leg: No edema.  Lymphadenopathy:     Cervical: No cervical adenopathy.  Skin:    General: Skin is warm and dry.  Neurological:     Mental Status: She is alert and oriented to person, place, and time.  Psychiatric:        Mood and Affect: Mood normal.        Behavior: Behavior normal.     BP (!) 150/89 (BP Location: Left Arm, Patient Position: Sitting, Cuff Size: Normal)   Pulse 87   Temp 97.6 F (36.4 C) (Temporal)   Wt 127 lb 3.2 oz (57.7 kg)   SpO2 97%   BMI 24.84 kg/m  Wt Readings from Last 3 Encounters:  05/24/20 127 lb 3.2 oz (57.7 kg)  11/30/19 127 lb 6.4 oz (57.8 kg)  09/27/19 129 lb 3.2 oz (58.6 kg)     Health Maintenance Due  Topic Date Due  . Hepatitis C Screening  Never done  . COVID-19 Vaccine (3 - Pfizer risk 4-dose series) 05/03/2019    There are no preventive care reminders to display for this patient.  Lab Results  Component Value Date   TSH 0.44 12/06/2019   Lab Results  Component Value Date   WBC 6.0 09/27/2019   HGB 13.2 09/27/2019   HCT 39.9 09/27/2019    MCV 91.2 09/27/2019   PLT 267.0 09/27/2019   Lab Results  Component Value Date   NA 136 09/27/2019   K 4.0 09/27/2019   CHLORIDE 108 05/05/2013   CO2 25 09/27/2019   GLUCOSE 103 (H) 09/27/2019   BUN 12 09/27/2019   CREATININE 0.82 09/27/2019   BILITOT 0.6 09/27/2019   ALKPHOS 59 09/27/2019   AST 25 09/27/2019   ALT 15 09/27/2019   PROT 7.0 09/27/2019   ALBUMIN 4.6 09/27/2019   CALCIUM 9.7 09/27/2019   ANIONGAP 9 07/19/2019   GFR 67.55 09/27/2019   Lab Results  Component Value Date   CHOL 192 09/12/2018   Lab Results  Component Value Date   HDL 75.60 09/12/2018   Lab Results  Component Value Date   LDLCALC 90 09/12/2018   Lab Results  Component Value Date   TRIG 132.0 09/12/2018   Lab Results  Component Value Date   CHOLHDL 3 09/12/2018   Lab Results  Component Value Date   HGBA1C 5.9 03/04/2018      Assessment & Plan:   Problem List Items Addressed This Visit      Cardiovascular and Mediastinum   Essential hypertension   Relevant Orders   CBC   Comprehensive metabolic panel   Urinalysis, Routine w reflex microscopic   VSD (ventricular septal defect)     Endocrine   Hypothyroidism   Relevant Orders   TSH     Other   Vitamin D deficiency   Relevant Orders  VITAMIN D 25 Hydroxy (Vit-D Deficiency, Fractures)   Hyperlipidemia   Relevant Orders   Comprehensive metabolic panel   Lipid panel   Grieving - Primary      No orders of the defined types were placed in this encounter.   Follow-up: Return in about 6 months (around 11/23/2020), or if symptoms worsen or fail to improve.    Libby Maw, MD

## 2020-05-28 ENCOUNTER — Ambulatory Visit (INDEPENDENT_AMBULATORY_CARE_PROVIDER_SITE_OTHER): Payer: Medicare Other

## 2020-05-28 VITALS — Ht 60.0 in | Wt 127.0 lb

## 2020-05-28 DIAGNOSIS — Z0000 Encounter for general adult medical examination without abnormal findings: Secondary | ICD-10-CM | POA: Diagnosis not present

## 2020-05-28 NOTE — Progress Notes (Signed)
Subjective:   Madison Huerta is a 80 y.o. female who presents for Medicare Annual (Subsequent) preventive examination.  I connected with [name] today by telephone and verified that I am speaking with the correct person using two identifiers. Location patient: home Location provider: work Persons participating in the virtual visit: patient, Marine scientist.    I discussed the limitations, risks, security and privacy concerns of performing an evaluation and management service by telephone and the availability of in person appointments. I also discussed with the patient that there may be a patient responsible charge related to this service. The patient expressed understanding and verbally consented to this telephonic visit.    Interactive audio and video telecommunications were attempted between this provider and patient, however failed, due to patient having technical difficulties OR patient did not have access to video capability.  We continued and completed visit with audio only.  Some vital signs may be absent or patient reported.   Time Spent with patient on telephone encounter: 20 minutes   Review of Systems     Cardiac Risk Factors include: advanced age (>26men, >38 women);dyslipidemia;hypertension     Objective:    Today's Vitals   05/28/20 0815  Weight: 127 lb (57.6 kg)  Height: 5' (1.524 m)   Body mass index is 24.8 kg/m.  Advanced Directives 05/28/2020 07/19/2019 05/24/2019 07/15/2018 03/04/2018 07/14/2017 03/03/2017  Does Patient Have a Medical Advance Directive? Yes Yes Yes Yes Yes Yes Yes  Type of Paramedic of [place];Living will [place];Living will [place];Living will [place];Living will [place];Living will Living will;Healthcare Power of [place];Living will  Does patient want to make changes to medical advance directive? - - No - Patient  declined - - No - Patient declined -  Copy of [place] in Chart? Yes - validated most recent copy scanned in chart (See row information) - Yes - validated most recent copy scanned in chart (See row information) - No - copy requested No - copy requested No - copy requested  Would patient like information on creating a medical advance directive? - - - - - - -    Current Medications (verified) Outpatient Encounter Medications as of 05/28/2020  Medication Sig  . acetaminophen (TYLENOL) 500 MG tablet Take 500 mg by mouth every 4 (four) hours as needed for fever.  Marland Kitchen alendronate (FOSAMAX) 70 MG tablet TAKE 1 TABLET BY MOUTH 1 TIME A WEEK WITH FULL GLASS OF WATER AND ON AN EMPTY STOMACH  . amLODipine (NORVASC) 5 MG tablet Take 1 tablet (5 mg total) by mouth daily.  Marland Kitchen anastrozole (ARIMIDEX) 1 MG tablet Take 1 tablet (1 mg total) by mouth daily.  . cholecalciferol (VITAMIN D) 1000 units tablet Take 1,000 Units by mouth daily.  Marland Kitchen levothyroxine (SYNTHROID) 75 MCG tablet TAKE 1 TABLET(75 MCG) BY MOUTH DAILY  . rosuvastatin (CRESTOR) 10 MG tablet Take 1 tablet (10 mg total) by mouth daily.   No facility-administered encounter medications on file as of 05/28/2020.    Allergies (verified) Codeine, Penicillins, and Adhesive [tape]   History: Past Medical History:  Diagnosis Date  . Breast cancer ([place]) 1994   L breast- lumpectomy, [facility]   . Breast cancer [facility]) 2013   right breast- mastectomy, care at [facility]  . Cataract   . Endocarditis   . GERD (gastroesophageal reflux disease)   . Heart murmur    VSD-  echocardiogram - [facility] 10 yrs. ago  . History of breast cancer   . Hyperlipidemia   . Hypertension   . Hypothyroidism   . Osteopenia   . Overweight(278.02)   . Personal history of radiation therapy 1994   left breast ca  . Thyroid disease    Hypothyroidism  . VSD (ventricular septal defect)    does need SBE prophylaxis (Keflex  3 gm 1 hour pprior and 1.5 gm 6 hours  post  . VSD (ventricular septal defect)    Past Surgical History:  Procedure Laterality Date  . 2D echo  2004   Normal at [facility]  . AUGMENTATION MAMMAPLASTY Bilateral    removed 1994  . BREAST BIOPSY Left 1994   breast ca  . BREAST BIOPSY Right 2013   breast ca  . BREAST IMPLANT REMOVAL  1994  . BREAST LUMPECTOMY Left 1994   lumpectomy and rad tx and tamoxifen  . MASTECTOMY Right 01/08/2012   complete and anastrozole  . MASTECTOMY W/ SENTINEL NODE BIOPSY  01/08/2012   Procedure: MASTECTOMY WITH SENTINEL LYMPH NODE BIOPSY;  Surgeon: Madilyn Hook, DO;  Location: [place];  Service: General;  Laterality: Right;  right breast mastectomy with sentinel lymph node biopsy   . PARATHYROIDECTOMY  12/2002  . TONSILLECTOMY     As a child  . TUBAL LIGATION     1976   Family History  Problem Relation Age of Onset  . Aneurysm Mother        brain  . Cancer Father        Lung, Liver mets  . Lung cancer Father 61  . Heart disease Other   . Cancer Cousin        Breast CA  . Breast cancer Cousin 30  . Breast cancer Maternal Aunt 18   Social History   Socioeconomic History  . Marital status: Widowed    Spouse name: Not on file  . Number of children: 2  . Years of education: Not on file  . Highest education level: Not on file  Occupational History  . Occupation: Animator: UNEMPLOYED  Tobacco Use  . Smoking status: Never Smoker  . Smokeless tobacco: Never Used  Vaping Use  . Vaping Use: Never used  Substance and Sexual Activity  . Alcohol use: Yes    Alcohol/week: 3.0 standard drinks    Types: 3 Glasses of wine per week  . Drug use: No  . Sexual activity: Never  Other Topics Concern  . Not on file  Social History Narrative  . Not on file   Social Determinants of Health   Financial Resource Strain: Low Risk   . Difficulty of Paying Living Expenses: Not hard at all  Food Insecurity: No Food Insecurity  . Worried About Charity fundraiser in the Last Year:  Never true  . Ran Out of Food in the Last Year: Never true  Transportation Needs: No Transportation Needs  . Lack of Transportation (Medical): No  . Lack of Transportation (Non-Medical): No  Physical Activity: Sufficiently Active  . Days of Exercise per Week: 7 days  . Minutes of Exercise per Session: 30 min  Stress: No Stress Concern Present  . Feeling of Stress : Only a little  Social Connections: Moderately Isolated  . Frequency of Communication with Friends and Family: More than three times a week  . Frequency of Social Gatherings with Friends and Family: More than three times a week  . Attends  Religious Services: Never  . Active Member of Clubs or Organizations: Yes  . Attends Archivist Meetings: More than 4 times per year  . Marital Status: Widowed    Tobacco Counseling Counseling given: Not Answered   Clinical Intake:  Pre-visit preparation completed: Yes  Pain : No/denies pain     Nutritional Status: BMI of 19-24  Normal Nutritional Risks: None Diabetes: No  How often do you need to have someone help you when you read instructions, pamphlets, or other written materials from your doctor or pharmacy?: 1 - Never  Diabetic?NO  Interpreter Needed?: No  Information entered by :: Caroleen Hamman LPN   Activities of Daily Living In your present state of health, do you have any difficulty performing the following activities: 05/28/2020  Hearing? N  Vision? N  Difficulty concentrating or making decisions? N  Walking or climbing stairs? N  Dressing or bathing? N  Doing errands, shopping? N  Preparing Food and eating ? N  Using the Toilet? N  In the past six months, have you accidently leaked urine? N  Do you have problems with loss of bowel control? N  Managing your Medications? N  Managing your Finances? N  Housekeeping or managing your Housekeeping? N  Some recent data might be hidden    Patient Care Team: Libby Maw, MD as PCP -  General (Family Medicine) Cammie Sickle, MD as Consulting Physician (Internal Medicine) Karsten Fells, DDS as Consulting Physician (Dentistry) Kathyrn Lass, OD as Consulting Physician (Optometry)  Indicate any recent Medical Services you may have received from other than [facility] providers in the past year (date may be approximate).     Assessment:   This is a routine wellness examination for Madison Huerta.  Hearing/Vision screen  Hearing Screening   125Hz  250Hz  500Hz  1000Hz  2000Hz  3000Hz  4000Hz  6000Hz  8000Hz   Right ear:           Left ear:           Comments: No issues  Vision Screening Comments: Wears glasses Last eye exam-2 yrs ago-appt scheduled for 06/2020-Dr. Digby  Dietary issues and exercise activities discussed: Current Exercise Habits: Home exercise routine, Type of exercise: walking, Time (Minutes): 35, Frequency (Times/Week): 7, Weekly Exercise (Minutes/Week): 245, Intensity: Mild, Exercise limited by: None identified  Goals    . Increase physical activity     Weather permitting, I will continue to walk for 30 minutes daily.     . Patient Stated     Drinking more water      Depression Screen PHQ 2/9 Scores 05/28/2020 05/24/2019 03/23/2019 03/23/2019 03/04/2018 03/03/2017 02/21/2016  PHQ - 2 Score 1 0 0 0 0 0 0  PHQ- 9 Score - - 0 - 0 0 -    Fall Risk Fall Risk  05/28/2020 05/24/2020 09/27/2019 05/24/2019 03/23/2019  Falls in the past year? 0 0 0 0 0  Number falls in past yr: 0 0 - 0 -  Injury with Fall? 0 0 - 0 -  Follow up Falls prevention discussed - - Education provided;Falls prevention discussed -    FALL RISK PREVENTION PERTAINING TO THE HOME:  Any stairs in or around the home? No  Home free of loose throw rugs in walkways, pet beds, electrical cords, etc? Yes  Adequate lighting in your home to reduce risk of falls? Yes   ASSISTIVE DEVICES UTILIZED TO PREVENT FALLS:  Life alert? No  Use of a cane, walker or w/c? No  Grab bars in the  bathroom? No  Shower chair or  bench in shower? No  Elevated toilet seat or a handicapped toilet? No   TIMED UP AND GO:  Was the test performed? No . Phone visit   Cognitive Function:Normal cognitive status assessed by  this Nurse Health Advisor. No abnormalities found.   MMSE - Mini Mental State Exam 03/04/2018 03/03/2017 02/21/2016  Orientation to time 5 5 5   Orientation to Place 5 5 5   Registration 3 3 3   Attention/ Calculation 0 0 0  Recall 3 3 3   Language- name 2 objects 0 0 0  Language- repeat 1 1 1   Language- follow 3 step command 3 3 3   Language- read & follow direction 0 0 0  Write a sentence 0 0 0  Copy design 0 0 0  Total score 20 20 20         Immunizations Immunization History  Administered Date(s) Administered  . Fluad Quad(high Dose 65+) 10/15/2018  . Influenza Split 11/20/2010  . Influenza Whole 12/15/2006, 01/23/2009, 12/13/2009, 11/16/2011  . Influenza, High Dose Seasonal PF 12/01/2014, 11/18/2016, 12/17/2017  . Influenza,inj,Quad PF,6+ Mos 10/21/2015  . Influenza-Unspecified 11/22/2012, 10/07/2013, 10/25/2019  . PFIZER(Purple Top)SARS-COV-2 Vaccination 03/15/2019, 04/05/2019, 12/25/2019  . Pneumococcal Conjugate-13 02/21/2014  . Pneumococcal Polysaccharide-23 01/14/2004, 02/12/2010  . Td 11/28/2001  . Tdap 02/18/2011  . Zoster 02/06/2008  . Zoster Recombinat (Shingrix) 05/25/2017, 09/15/2017    TDAP status: Up to date  Flu Vaccine status: Up to date  Pneumococcal vaccine status: Up to date  Covid-19 vaccine status: Completed vaccines  Qualifies for Shingles Vaccine? No   Zostavax completed Yes   Shingrix Completed?: Yes  Screening Tests Health Maintenance  Topic Date Due  . Hepatitis C Screening  Never done  . PAP SMEAR-Modifier  03/04/2027 (Originally 02/25/2017)  . COVID-19 Vaccine (4 - Booster for Pfizer series) 06/23/2020  . INFLUENZA VACCINE  09/23/2020  . MAMMOGRAM  12/06/2020  . TETANUS/TDAP  02/17/2021  . DEXA SCAN  Completed  . PNA vac Low Risk Adult   Completed  . HPV VACCINES  Aged Out    Health Maintenance  Health Maintenance Due  Topic Date Due  . Hepatitis C Screening  Never done    Colorectal cancer screening: No longer required.   Mammogram status: Completed Unilateral-left-12/07/2019. Repeat every year  Bone Density status: Completed 12/05/2018. Results reflect: Bone density results: OSTEOPENIA. Repeat every 2 years.  Lung Cancer Screening: (Low Dose CT Chest recommended if Age 25-80 years, 30 pack-year currently smoking OR have quit w/in 15years.) does not qualify.     Additional Screening:  Hepatitis C Screening: does not qualify  Vision Screening: Recommended annual ophthalmology exams for early detection of glaucoma and other disorders of the eye. Is the patient up to date with their annual eye exam?  No  Who is the provider or what is the name of the office in which the patient attends annual eye exams? Dr. Bing Plume Patient has an appt scheduled for next month   Dental Screening: Recommended annual dental exams for proper oral hygiene  Community Resource Referral / Chronic Care Management: CRR required this visit?  No   CCM required this visit?  No      Plan:     I have personally reviewed and noted the following in the patient's chart:   . Medical and social history . Use of alcohol, tobacco or illicit drugs  . Current medications and supplements . Functional ability and status . Nutritional status . Physical activity .  Advanced directives . List of other physicians . Hospitalizations, surgeries, and ER visits in previous 12 months . Vitals . Screenings to include cognitive, depression, and falls . Referrals and appointments  In addition, I have reviewed and discussed with patient certain preventive protocols, quality metrics, and best practice recommendations. A written personalized care plan for preventive services as well as general preventive health recommendations were provided to patient.    Due to this being a telephonic visit, the after visit summary with patients personalized plan was offered to patient via mail or my-chart. Patient would like to access on my-chart.   Marta Antu, LPN   2/0/6015  Nurse health Advisor  Nurse Notes: None

## 2020-05-28 NOTE — Patient Instructions (Signed)
Madison Huerta , Thank you for taking time to come for your Medicare Wellness Visit. I appreciate your ongoing commitment to your health goals. Please review the following plan we discussed and let me know if I can assist you in the future.   Screening recommendations/referrals: Colonoscopy: No longer required Mammogram: Completed 12/07/2019-Due 12/06/2020 Bone Density: Scheduled for 06/2020 Recommended yearly ophthalmology/optometry visit for glaucoma screening and checkup Recommended yearly dental visit for hygiene and checkup  Vaccinations: Influenza vaccine: Up to date Pneumococcal vaccine: Completed vaccines Tdap vaccine: Up to date-Due-02/17/2021 Shingles vaccine: Completed vaccines   Covid-19:Completed vaccines  Advanced directives: Copy in chart  Conditions/risks identified: See problem list  Next appointment: Follow up in one year for your annual wellness visit 06/03/2021 @ 8:15   Preventive Care 65 Years and Older, Female Preventive care refers to lifestyle choices and visits with your health care provider that can promote health and wellness. What does preventive care include?  A yearly physical exam. This is also called an annual well check.  Dental exams once or twice a year.  Routine eye exams. Ask your health care provider how often you should have your eyes checked.  Personal lifestyle choices, including:  Daily care of your teeth and gums.  Regular physical activity.  Eating a healthy diet.  Avoiding tobacco and drug use.  Limiting alcohol use.  Practicing safe sex.  Taking low-dose aspirin every day.  Taking vitamin and mineral supplements as recommended by your health care provider. What happens during an annual well check? The services and screenings done by your health care provider during your annual well check will depend on your age, overall health, lifestyle risk factors, and family history of disease. Counseling  Your health care provider may  ask you questions about your:  Alcohol use.  Tobacco use.  Drug use.  Emotional well-being.  Home and relationship well-being.  Sexual activity.  Eating habits.  History of falls.  Memory and ability to understand (cognition).  Work and work Statistician.  Reproductive health. Screening  You may have the following tests or measurements:  Height, weight, and BMI.  Blood pressure.  Lipid and cholesterol levels. These may be checked every 5 years, or more frequently if you are over 63 years old.  Skin check.  Lung cancer screening. You may have this screening every year starting at age 30 if you have a 30-pack-year history of smoking and currently smoke or have quit within the past 15 years.  Fecal occult blood test (FOBT) of the stool. You may have this test every year starting at age 61.  Flexible sigmoidoscopy or colonoscopy. You may have a sigmoidoscopy every 5 years or a colonoscopy every 10 years starting at age 65.  Hepatitis C blood test.  Hepatitis B blood test.  Sexually transmitted disease (STD) testing.  Diabetes screening. This is done by checking your blood sugar (glucose) after you have not eaten for a while (fasting). You may have this done every 1-3 years.  Bone density scan. This is done to screen for osteoporosis. You may have this done starting at age 41.  Mammogram. This may be done every 1-2 years. Talk to your health care provider about how often you should have regular mammograms. Talk with your health care provider about your test results, treatment options, and if necessary, the need for more tests. Vaccines  Your health care provider may recommend certain vaccines, such as:  Influenza vaccine. This is recommended every year.  Tetanus, diphtheria, and acellular  pertussis (Tdap, Td) vaccine. You may need a Td booster every 10 years.  Zoster vaccine. You may need this after age 58.  Pneumococcal 13-valent conjugate (PCV13) vaccine. One  dose is recommended after age 21.  Pneumococcal polysaccharide (PPSV23) vaccine. One dose is recommended after age 21. Talk to your health care provider about which screenings and vaccines you need and how often you need them. This information is not intended to replace advice given to you by your health care provider. Make sure you discuss any questions you have with your health care provider. Document Released: 03/08/2015 Document Revised: 10/30/2015 Document Reviewed: 12/11/2014 Elsevier Interactive Patient Education  2017 [place] Prevention in the Home Falls can cause injuries. They can happen to people of all ages. There are many things you can do to make your home safe and to help prevent falls. What can I do on the outside of my home?  Regularly fix the edges of walkways and driveways and fix any cracks.  Remove anything that might make you trip as you walk through a door, such as a raised step or threshold.  Trim any bushes or trees on the path to your home.  Use bright outdoor lighting.  Clear any walking paths of anything that might make someone trip, such as rocks or tools.  Regularly check to see if handrails are loose or broken. Make sure that both sides of any steps have handrails.  Any raised decks and porches should have guardrails on the edges.  Have any leaves, snow, or ice cleared regularly.  Use sand or salt on walking paths during winter.  Clean up any spills in your garage right away. This includes oil or grease spills. What can I do in the bathroom?  Use night lights.  Install grab bars by the toilet and in the tub and shower. Do not use towel bars as grab bars.  Use non-skid mats or decals in the tub or shower.  If you need to sit down in the shower, use a plastic, non-slip stool.  Keep the floor dry. Clean up any water that spills on the floor as soon as it happens.  Remove soap buildup in the tub or shower regularly.  Attach bath  mats securely with double-sided non-slip rug tape.  Do not have throw rugs and other things on the floor that can make you trip. What can I do in the bedroom?  Use night lights.  Make sure that you have a light by your bed that is easy to reach.  Do not use any sheets or blankets that are too big for your bed. They should not hang down onto the floor.  Have a firm chair that has side arms. You can use this for support while you get dressed.  Do not have throw rugs and other things on the floor that can make you trip. What can I do in the kitchen?  Clean up any spills right away.  Avoid walking on wet floors.  Keep items that you use a lot in easy-to-reach places.  If you need to reach something above you, use a strong step stool that has a grab bar.  Keep electrical cords out of the way.  Do not use floor polish or wax that makes floors slippery. If you must use wax, use non-skid floor wax.  Do not have throw rugs and other things on the floor that can make you trip. What can I do with my stairs?  Do not leave any items on the stairs.  Make sure that there are handrails on both sides of the stairs and use them. Fix handrails that are broken or loose. Make sure that handrails are as long as the stairways.  Check any carpeting to make sure that it is firmly attached to the stairs. Fix any carpet that is loose or worn.  Avoid having throw rugs at the top or bottom of the stairs. If you do have throw rugs, attach them to the floor with carpet tape.  Make sure that you have a light switch at the top of the stairs and the bottom of the stairs. If you do not have them, ask someone to add them for you. What else can I do to help prevent falls?  Wear shoes that:  Do not have high heels.  Have rubber bottoms.  Are comfortable and fit you well.  Are closed at the toe. Do not wear sandals.  If you use a stepladder:  Make sure that it is fully opened. Do not climb a closed  stepladder.  Make sure that both sides of the stepladder are locked into place.  Ask someone to hold it for you, if possible.  Clearly mark and make sure that you can see:  Any grab bars or handrails.  First and last steps.  Where the edge of each step is.  Use tools that help you move around (mobility aids) if they are needed. These include:  Canes.  Walkers.  Scooters.  Crutches.  Turn on the lights when you go into a dark area. Replace any light bulbs as soon as they burn out.  Set up your furniture so you have a clear path. Avoid moving your furniture around.  If any of your floors are uneven, fix them.  If there are any pets around you, be aware of where they are.  Review your medicines with your doctor. Some medicines can make you feel dizzy. This can increase your chance of falling. Ask your doctor what other things that you can do to help prevent falls. This information is not intended to replace advice given to you by your health care provider. Make sure you discuss any questions you have with your health care provider. Document Released: 12/06/2008 Document Revised: 07/18/2015 Document Reviewed: 03/16/2014 Elsevier Interactive Patient Education  2017 [employer].

## 2020-06-11 ENCOUNTER — Other Ambulatory Visit: Payer: Self-pay | Admitting: Family Medicine

## 2020-06-11 DIAGNOSIS — I10 Essential (primary) hypertension: Secondary | ICD-10-CM

## 2020-06-25 ENCOUNTER — Ambulatory Visit
Admission: RE | Admit: 2020-06-25 | Discharge: 2020-06-25 | Disposition: A | Discharge status: Home / Self Care | Payer: Medicare Other | Source: Ambulatory Visit | Attending: Internal Medicine | Admitting: Internal Medicine

## 2020-06-25 ENCOUNTER — Other Ambulatory Visit: Payer: Self-pay

## 2020-06-25 DIAGNOSIS — Z780 Asymptomatic menopausal state: Secondary | ICD-10-CM | POA: Diagnosis not present

## 2020-06-25 DIAGNOSIS — M8580 Other specified disorders of bone density and structure, unspecified site: Secondary | ICD-10-CM

## 2020-06-25 DIAGNOSIS — M8589 Other specified disorders of bone density and structure, multiple sites: Secondary | ICD-10-CM | POA: Diagnosis not present

## 2020-06-25 DIAGNOSIS — C50311 Malignant neoplasm of lower-inner quadrant of right female breast: Secondary | ICD-10-CM

## 2020-07-02 ENCOUNTER — Encounter: Payer: Self-pay | Admitting: Internal Medicine

## 2020-07-02 ENCOUNTER — Inpatient Hospital Stay: Payer: Medicare Other | Attending: Internal Medicine

## 2020-07-02 ENCOUNTER — Inpatient Hospital Stay: Payer: Medicare Other | Admitting: Internal Medicine

## 2020-07-02 ENCOUNTER — Other Ambulatory Visit: Payer: Self-pay

## 2020-07-02 VITALS — BP 169/73 | HR 74 | Temp 98.6°F | Resp 20 | Ht 60.0 in

## 2020-07-02 DIAGNOSIS — Z9011 Acquired absence of right breast and nipple: Secondary | ICD-10-CM | POA: Insufficient documentation

## 2020-07-02 DIAGNOSIS — Z853 Personal history of malignant neoplasm of breast: Secondary | ICD-10-CM | POA: Diagnosis not present

## 2020-07-02 DIAGNOSIS — Z170 Estrogen receptor positive status [ER+]: Secondary | ICD-10-CM | POA: Insufficient documentation

## 2020-07-02 DIAGNOSIS — M8580 Other specified disorders of bone density and structure, unspecified site: Secondary | ICD-10-CM | POA: Insufficient documentation

## 2020-07-02 DIAGNOSIS — C50311 Malignant neoplasm of lower-inner quadrant of right female breast: Secondary | ICD-10-CM | POA: Insufficient documentation

## 2020-07-02 DIAGNOSIS — Z923 Personal history of irradiation: Secondary | ICD-10-CM | POA: Insufficient documentation

## 2020-07-02 DIAGNOSIS — Z803 Family history of malignant neoplasm of breast: Secondary | ICD-10-CM | POA: Diagnosis not present

## 2020-07-02 DIAGNOSIS — Z79811 Long term (current) use of aromatase inhibitors: Secondary | ICD-10-CM | POA: Insufficient documentation

## 2020-07-02 DIAGNOSIS — Z79899 Other long term (current) drug therapy: Secondary | ICD-10-CM | POA: Insufficient documentation

## 2020-07-02 DIAGNOSIS — Z801 Family history of malignant neoplasm of trachea, bronchus and lung: Secondary | ICD-10-CM | POA: Diagnosis not present

## 2020-07-02 DIAGNOSIS — R300 Dysuria: Secondary | ICD-10-CM | POA: Diagnosis not present

## 2020-07-02 DIAGNOSIS — N952 Postmenopausal atrophic vaginitis: Secondary | ICD-10-CM | POA: Insufficient documentation

## 2020-07-02 LAB — CBC WITH DIFFERENTIAL/PLATELET
Abs Immature Granulocytes: 0.02 10*3/uL (ref 0.00–0.07)
Basophils Absolute: 0.1 10*3/uL (ref 0.0–0.1)
Basophils Relative: 2 %
Eosinophils Absolute: 0.1 10*3/uL (ref 0.0–0.5)
Eosinophils Relative: 2 %
HCT: 40.2 % (ref 36.0–46.0)
Hemoglobin: 13.6 g/dL (ref 12.0–15.0)
Immature Granulocytes: 0 %
Lymphocytes Relative: 23 %
Lymphs Abs: 1.4 10*3/uL (ref 0.7–4.0)
MCH: 30.6 pg (ref 26.0–34.0)
MCHC: 33.8 g/dL (ref 30.0–36.0)
MCV: 90.3 fL (ref 80.0–100.0)
Monocytes Absolute: 0.7 10*3/uL (ref 0.1–1.0)
Monocytes Relative: 12 %
Neutro Abs: 3.6 10*3/uL (ref 1.7–7.7)
Neutrophils Relative %: 61 %
Platelets: 273 10*3/uL (ref 150–400)
RBC: 4.45 MIL/uL (ref 3.87–5.11)
RDW: 12.1 % (ref 11.5–15.5)
WBC: 5.9 10*3/uL (ref 4.0–10.5)
nRBC: 0 % (ref 0.0–0.2)

## 2020-07-02 NOTE — Assessment & Plan Note (Addendum)
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  #Clinically no evidence of recurrence. STABLE:LEFT mammogram in October 2021.  Continue as well.  Tolerating well anastrozole without any major side effects.  #Left breast-calcifications-s/p evaluation with Dr. Bary Castilla.  Monitor for now.  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  Discussed that Arimidex could be discontinued if she is very symptomatic.  In general would recommend Arimidex until 2023.  #DEXA scan MAY 2022- Osteopenia T score -1.7-STABLE; ; continue Fosamax;  Continue the same for now.  # DISPOSITION:  # follow up in 12 months-MD/ labs- cbc/cmp # LEFT  mammo in October 2022- Dr.B

## 2020-07-02 NOTE — Progress Notes (Signed)
[facility] OFFICE PROGRESS NOTE  Patient Care Team: Libby Maw, MD as PCP - General (Family Medicine) Cammie Sickle, MD as Consulting Physician (Internal Medicine) Karsten Fells, DDS as Consulting Physician (Dentistry) Kathyrn Lass, OD as Consulting Physician (Optometry)  Cancer Staging Carcinoma of lower-inner quadrant of right breast in female, estrogen receptor positive ([place]) Staging form: Breast, AJCC 7th Edition - Clinical: No stage assigned - Unsigned    Oncology History Overview Note  # 1994- LEFT BREAST CA s/p Lumpec; RT- Tam  # OCT 2013-  RIGHT BREAST CA pT1c [multifocal-1.2cm s/p Mastec] pN0 ER/PR-pos;Her 2Neu-NEG;Dec 2013- Arimidex; mammo-Sep 2017-N; BREAST CANCER INDEX- BENEFIT from extended AI.  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  female pleasant patient above history of stage I breast cancer is here for follow-up/review results of the bone density test.  Patient's husband passed away in January 2022-complications of aneurysm.  She is grieving but coping up well.  Patient continues to have intermittent dysuria attributable to atrophic vaginitis.  Patient denies any blood in stools or black or stools.  Denies any reflux.  No bone pain.  No shortness of breath cough.  No hot flashes.   Review of Systems  Constitutional: Negative for chills, diaphoresis, fever, malaise/fatigue and weight loss.  HENT: Negative for nosebleeds and sore throat.   Eyes: Negative for double vision.  Respiratory: Negative for cough, hemoptysis, sputum production, shortness  of breath and wheezing.   Cardiovascular: Negative for chest pain, palpitations, orthopnea and leg swelling.  Gastrointestinal: Negative for abdominal pain, blood in stool, constipation, diarrhea, heartburn, melena, nausea and vomiting.  Genitourinary: Negative for dysuria, frequency and urgency.  Musculoskeletal: Negative for back pain and joint pain.  Skin: Negative.  Negative for itching and rash.  Neurological: Negative for dizziness, tingling, focal weakness, weakness and headaches.  Endo/Heme/Allergies: Does not bruise/bleed easily.  Psychiatric/Behavioral: Negative for depression. The patient is not nervous/anxious and does not have insomnia.       PAST MEDICAL HISTORY :  Past Medical History:  Diagnosis Date  . Breast cancer ([place]) 1994   L breast- lumpectomy, care for at [facility]   . Breast cancer [facility]) 2013   right breast- mastectomy, care at [facility]  . Cataract   . Endocarditis   . GERD (gastroesophageal reflux disease)   . Heart murmur    VSD- echocardiogram - [facility] 10 yrs. ago  . History of breast cancer   . Hyperlipidemia   . Hypertension   . Hypothyroidism   . Osteopenia   . Overweight(278.02)   . Personal history of radiation therapy 1994   left breast ca  . Thyroid disease    Hypothyroidism  . VSD (ventricular septal defect)    does need SBE prophylaxis (Keflex  3 gm 1 hour pprior and 1.5 gm 6 hours post  . VSD (ventricular septal defect)     PAST SURGICAL HISTORY :   Past Surgical History:  Procedure Laterality Date  . 2D echo  2004   Normal at [facility]  . AUGMENTATION MAMMAPLASTY Bilateral    removed 1994  . BREAST BIOPSY Left 1994  breast ca  . BREAST BIOPSY Right 2013   breast ca  . BREAST IMPLANT REMOVAL  1994  . BREAST LUMPECTOMY Left 1994   lumpectomy and rad tx and tamoxifen  . MASTECTOMY Right 01/08/2012   complete and anastrozole  . MASTECTOMY W/ SENTINEL NODE BIOPSY  01/08/2012   Procedure: MASTECTOMY WITH SENTINEL LYMPH NODE BIOPSY;  Surgeon:  Madilyn Hook, DO;  Location: [facility];  Service: General;  Laterality: Right;  right breast mastectomy with sentinel lymph node biopsy   . PARATHYROIDECTOMY  12/2002  . TONSILLECTOMY     As a child  . TUBAL LIGATION     1976    FAMILY HISTORY :   Family History  Problem Relation Age of Onset  . Aneurysm Mother        brain  . Cancer Father        Lung, Liver mets  . Lung cancer Father 53  . Heart disease Other   . Cancer Cousin        Breast CA  . Breast cancer Cousin 30  . Breast cancer Maternal Aunt 82    SOCIAL HISTORY:   Social History   Tobacco Use  . Smoking status: Never Smoker  . Smokeless tobacco: Never Used  Vaping Use  . Vaping Use: Never used  Substance Use Topics  . Alcohol use: Yes    Alcohol/week: 3.0 standard drinks    Types: 3 Glasses of wine per week  . Drug use: No    ALLERGIES:  is allergic to codeine, penicillins, and adhesive [tape].  MEDICATIONS:  Current Outpatient Medications  Medication Sig Dispense Refill  . acetaminophen (TYLENOL) 500 MG tablet Take 500 mg by mouth every 4 (four) hours as needed for fever.    Marland Kitchen alendronate (FOSAMAX) 70 MG tablet TAKE 1 TABLET BY MOUTH 1 TIME A WEEK WITH FULL GLASS OF WATER AND ON AN EMPTY STOMACH 12 tablet 1  . amLODipine (NORVASC) 5 MG tablet TAKE 1 TABLET(5 MG) BY MOUTH DAILY 90 tablet 1  . anastrozole (ARIMIDEX) 1 MG tablet Take 1 tablet (1 mg total) by mouth daily. 90 tablet 4  . cholecalciferol (VITAMIN D) 1000 units tablet Take 1,000 Units by mouth daily.    Marland Kitchen levothyroxine (SYNTHROID) 75 MCG tablet TAKE 1 TABLET(75 MCG) BY MOUTH DAILY 90 tablet 3  . rosuvastatin (CRESTOR) 10 MG tablet Take 1 tablet (10 mg total) by mouth daily. 90 tablet 3   No current facility-administered medications for this visit.    PHYSICAL EXAMINATION: ECOG PERFORMANCE STATUS: 0 - Asymptomatic  BP (!) 169/73   Pulse 74   Temp 98.6 F (37 C) (Tympanic)   Resp 20   Ht 5' (1.524 m)   BMI 24.80 kg/m   There were no  vitals filed for this visit.  Physical Exam HENT:     Head: Normocephalic and atraumatic.     Mouth/Throat:     Pharynx: No oropharyngeal exudate.  Eyes:     Pupils: Pupils are equal, round, and reactive to light.  Cardiovascular:     Rate and Rhythm: Normal rate and regular rhythm.  Pulmonary:     Effort: No respiratory distress.     Breath sounds: No wheezing.  Abdominal:     General: Bowel sounds are normal. There is no distension.     Palpations: Abdomen is soft. There is no mass.     Tenderness: There is no abdominal tenderness. There is no guarding or rebound.  Musculoskeletal:  General: No tenderness. Normal range of motion.     Cervical back: Normal range of motion and neck supple.  Skin:    General: Skin is warm.     Comments: Right mastectomy site noted.  No concerns for recurrence.  Left breast-lumpectomy incision noted.  No dominant lesions noted.  However patient has prominent suture-like material sticking out of lumpectomy incision.   Neurological:     Mental Status: She is alert and oriented to person, place, and time.  Psychiatric:        Mood and Affect: Affect normal.       LABORATORY DATA:  I have reviewed the data as listed    Component Value Date/Time   NA 139 05/24/2020 1125   NA 144 05/05/2013 0807   K 3.7 05/24/2020 1125   K 3.8 05/05/2013 0807   CL 102 05/24/2020 1125   CL 105 05/06/2012 1433   CO2 26 05/24/2020 1125   CO2 25 05/05/2013 0807   GLUCOSE 100 (H) 05/24/2020 1125   GLUCOSE 97 05/05/2013 0807   GLUCOSE 93 05/06/2012 1433   BUN 12 05/24/2020 1125   BUN 15.6 05/05/2013 0807   CREATININE 0.82 05/24/2020 1125   CREATININE 1.06 11/13/2013 1247   CREATININE 1.0 05/05/2013 0807   CALCIUM 9.8 05/24/2020 1125   CALCIUM 9.7 05/05/2013 0807   PROT 7.5 05/24/2020 1125   PROT 8.0 11/13/2013 1247   PROT 7.8 05/05/2013 0807   ALBUMIN 4.9 05/24/2020 1125   ALBUMIN 4.6 11/13/2013 1247   ALBUMIN 4.5 05/05/2013 0807   AST 25  05/24/2020 1125   AST 32 11/13/2013 1247   AST 27 05/05/2013 0807   ALT 13 05/24/2020 1125   ALT 31 11/13/2013 1247   ALT 19 05/05/2013 0807   ALKPHOS 71 05/24/2020 1125   ALKPHOS 58 11/13/2013 1247   ALKPHOS 79 05/05/2013 0807   BILITOT 0.8 05/24/2020 1125   BILITOT 0.7 11/13/2013 1247   BILITOT 0.64 05/05/2013 0807   GFRNONAA 58 (L) 07/19/2019 0900   GFRNONAA 53 (L) 11/13/2013 1247   GFRAA >60 07/19/2019 0900   GFRAA >60 11/13/2013 1247    No results found for: SPEP, UPEP  Lab Results  Component Value Date   WBC 5.9 07/02/2020   NEUTROABS 3.6 07/02/2020   HGB 13.6 07/02/2020   HCT 40.2 07/02/2020   MCV 90.3 07/02/2020   PLT 273 07/02/2020      Chemistry      Component Value Date/Time   NA 139 05/24/2020 1125   NA 144 05/05/2013 0807   K 3.7 05/24/2020 1125   K 3.8 05/05/2013 0807   CL 102 05/24/2020 1125   CL 105 05/06/2012 1433   CO2 26 05/24/2020 1125   CO2 25 05/05/2013 0807   BUN 12 05/24/2020 1125   BUN 15.6 05/05/2013 0807   CREATININE 0.82 05/24/2020 1125   CREATININE 1.06 11/13/2013 1247   CREATININE 1.0 05/05/2013 0807      Component Value Date/Time   CALCIUM 9.8 05/24/2020 1125   CALCIUM 9.7 05/05/2013 0807   ALKPHOS 71 05/24/2020 1125   ALKPHOS 58 11/13/2013 1247   ALKPHOS 79 05/05/2013 0807   AST 25 05/24/2020 1125   AST 32 11/13/2013 1247   AST 27 05/05/2013 0807   ALT 13 05/24/2020 1125   ALT 31 11/13/2013 1247   ALT 19 05/05/2013 0807   BILITOT 0.8 05/24/2020 1125   BILITOT 0.7 11/13/2013 1247   BILITOT 0.64 05/05/2013 5883  RADIOGRAPHIC STUDIES: I have personally reviewed the radiological images as listed and agreed with the findings in the report. No results found.   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  #Clinically no evidence of recurrence. STABLE:LEFT mammogram in October 2021.  Continue as well.  Tolerating well anastrozole without any major side effects.  #Left breast-calcifications-s/p evaluation with Dr. Bary Castilla.  Monitor for now.  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  Discussed that Arimidex could be discontinued if she is very symptomatic.  In general would recommend Arimidex until 2023.  #DEXA scan MAY 2022- Osteopenia T score -1.7-STABLE; ; continue Fosamax;  Continue the same for now.  # DISPOSITION:  # follow up in 12 months-MD/ labs- cbc/cmp # LEFT  mammo in October 2022- Dr.B   Orders Placed This Encounter  Procedures  . MM 3D SCREEN BREAST UNI LEFT    Standing Status:   Future    Standing Expiration Date:   07/02/2021    Order Specific Question:   Reason for Exam (SYMPTOM  OR DIAGNOSIS REQUIRED)    Answer:   HISTORY OF BREAST CANCER    Order Specific Question:   Preferred imaging location?    Answer:   [facility]   All questions were answered. The patient knows to call the clinic with any problems, questions or concerns.      Cammie Sickle, MD 07/02/2020 11:00 AM

## 2020-07-04 DIAGNOSIS — H2513 Age-related nuclear cataract, bilateral: Secondary | ICD-10-CM | POA: Diagnosis not present

## 2020-07-04 DIAGNOSIS — H43811 Vitreous degeneration, right eye: Secondary | ICD-10-CM | POA: Diagnosis not present

## 2020-07-04 DIAGNOSIS — H04123 Dry eye syndrome of bilateral lacrimal glands: Secondary | ICD-10-CM | POA: Diagnosis not present

## 2020-08-20 ENCOUNTER — Other Ambulatory Visit: Payer: Self-pay | Admitting: Internal Medicine

## 2020-08-20 DIAGNOSIS — C50311 Malignant neoplasm of lower-inner quadrant of right female breast: Secondary | ICD-10-CM

## 2020-08-20 DIAGNOSIS — Z170 Estrogen receptor positive status [ER+]: Secondary | ICD-10-CM

## 2020-08-20 DIAGNOSIS — M8589 Other specified disorders of bone density and structure, multiple sites: Secondary | ICD-10-CM

## 2020-08-21 MED ORDER — ANASTROZOLE 1 MG PO TABS: 1.0000 mg | ORAL_TABLET | Freq: Every day | ORAL | 4 refills | Status: DC

## 2020-10-02 ENCOUNTER — Other Ambulatory Visit: Payer: Self-pay | Admitting: Family Medicine

## 2020-10-02 DIAGNOSIS — E7800 Pure hypercholesterolemia, unspecified: Secondary | ICD-10-CM

## 2020-11-27 ENCOUNTER — Ambulatory Visit: Payer: Medicare Other | Admitting: Family Medicine

## 2020-11-29 ENCOUNTER — Encounter: Payer: Self-pay | Admitting: Family Medicine

## 2020-11-29 ENCOUNTER — Other Ambulatory Visit: Payer: Self-pay

## 2020-11-29 ENCOUNTER — Ambulatory Visit (INDEPENDENT_AMBULATORY_CARE_PROVIDER_SITE_OTHER): Payer: Medicare Other | Admitting: Family Medicine

## 2020-11-29 VITALS — BP 156/70 | HR 71 | Temp 97.5°F | Ht 60.0 in | Wt 126.6 lb

## 2020-11-29 DIAGNOSIS — Q210 Ventricular septal defect: Secondary | ICD-10-CM

## 2020-11-29 DIAGNOSIS — I10 Essential (primary) hypertension: Secondary | ICD-10-CM

## 2020-11-29 DIAGNOSIS — E782 Mixed hyperlipidemia: Secondary | ICD-10-CM

## 2020-11-29 DIAGNOSIS — E039 Hypothyroidism, unspecified: Secondary | ICD-10-CM

## 2020-11-29 LAB — COMPREHENSIVE METABOLIC PANEL
ALT: 13 U/L (ref 0–35)
AST: 26 U/L (ref 0–37)
Albumin: 4.8 g/dL (ref 3.5–5.2)
Alkaline Phosphatase: 61 U/L (ref 39–117)
BUN: 11 mg/dL (ref 6–23)
CO2: 27 mEq/L (ref 19–32)
Calcium: 9.7 mg/dL (ref 8.4–10.5)
Chloride: 104 mEq/L (ref 96–112)
Creatinine, Ser: 0.79 mg/dL (ref 0.40–1.20)
GFR: 71.63 mL/min (ref >60.00)
Glucose, Bld: 104 mg/dL — ABNORMAL HIGH (ref 70–99)
Potassium: 4.1 mEq/L (ref 3.5–5.1)
Sodium: 140 mEq/L (ref 135–145)
Total Bilirubin: 0.8 mg/dL (ref 0.2–1.2)
Total Protein: 7.3 g/dL (ref 6.0–8.3)

## 2020-11-29 LAB — LIPID PANEL
Cholesterol: 188 mg/dL (ref 0–200)
HDL: 77.3 mg/dL (ref >39.00)
LDL Cholesterol: 87 mg/dL (ref 0–99)
NonHDL: 110.47
Total CHOL/HDL Ratio: 2
Triglycerides: 117 mg/dL (ref 0.0–149.0)
VLDL: 23.4 mg/dL (ref 0.0–40.0)

## 2020-11-29 LAB — CBC
HCT: 41.7 % (ref 36.0–46.0)
Hemoglobin: 13.9 g/dL (ref 12.0–15.0)
MCHC: 33.4 g/dL (ref 30.0–36.0)
MCV: 90.5 fl (ref 78.0–100.0)
Platelets: 280 10*3/uL (ref 150.0–400.0)
RBC: 4.61 Mil/uL (ref 3.87–5.11)
RDW: 13.2 % (ref 11.5–15.5)
WBC: 6.9 10*3/uL (ref 4.0–10.5)

## 2020-11-29 LAB — URINALYSIS, ROUTINE W REFLEX MICROSCOPIC
Bilirubin Urine: NEGATIVE
Ketones, ur: NEGATIVE
Nitrite: POSITIVE — AB
Specific Gravity, Urine: 1.01 (ref 1.000–1.030)
Total Protein, Urine: NEGATIVE
Urine Glucose: NEGATIVE
Urobilinogen, UA: 0.2 (ref 0.0–1.0)
pH: 5.5 (ref 5.0–8.0)

## 2020-11-29 LAB — TSH: TSH: 0.1 u[IU]/mL — ABNORMAL LOW (ref 0.35–5.50)

## 2020-11-29 NOTE — Progress Notes (Signed)
Established Patient Office Visit  Subjective:  Patient ID: Madison Huerta, female    DOB: [birth year]  Age: 80 y.o. MRN: [id number]  CC:  Chief Complaint  Patient presents with   Follow-up    6 month follow up no concerns. Patient fasting.     HPI Madison Huerta presents for follow-up of hypertension, hypothyroidism and elevated cholesterol.  Continues with the grieving process but things seem to be getting better.  She is staying busy in her community spending time with her daughter who lives close by.  Reminds me of her history of documented whitecoat syndrome.  Blood pressure at home runs in the 130s over 80s.  History of VSD.  Echocardiogram obtained in 2016 reviewed.  She will be on Arimidex through next year.  Breast cancer has been responding to current treatment.  She had a fall earlier this year.  She had tripped on the sidewalk and caught herself with an outstretched hand.  No broken bones.  Denies lightheadedness or palpitations.  Past Medical History:  Diagnosis Date   Breast cancer ([place]) 1994   L breast- lumpectomy, care for at [facility]) 2013   right breast- mastectomy, care at [facility]   Cataract    Endocarditis    GERD (gastroesophageal reflux disease)    Heart murmur    VSD- echocardiogram - [facility] 10 yrs. ago   History of breast cancer    Hyperlipidemia    Hypertension    Hypothyroidism    Osteopenia    Overweight(278.02)    Personal history of radiation therapy 1994   left breast ca   Thyroid disease    Hypothyroidism   VSD (ventricular septal defect)    does need SBE prophylaxis (Keflex  3 gm 1 hour pprior and 1.5 gm 6 hours post   VSD (ventricular septal defect)     Past Surgical History:  Procedure Laterality Date   2D echo  2004   Normal at [facility] Bilateral    removed [place] Left 1994   breast ca   BREAST BIOPSY Right 2013   breast ca   BREAST IMPLANT REMOVAL  1994   BREAST LUMPECTOMY Left  1994   lumpectomy and rad tx and tamoxifen   MASTECTOMY Right 01/08/2012   complete and anastrozole   MASTECTOMY W/ SENTINEL NODE BIOPSY  01/08/2012   Procedure: MASTECTOMY WITH SENTINEL LYMPH NODE BIOPSY;  Surgeon: Madilyn Hook, DO;  Location: [facility];  Service: General;  Laterality: Right;  right breast mastectomy with sentinel lymph node biopsy    PARATHYROIDECTOMY  12/2002   TONSILLECTOMY     As a child   TUBAL LIGATION     1976    Family History  Problem Relation Age of Onset   Aneurysm Mother        brain   Cancer Father        Lung, Liver mets   Lung cancer Father 4   Heart disease Other    Cancer Cousin        Breast CA   Breast cancer Cousin 30   Breast cancer Maternal Aunt 55    Social History   Socioeconomic History   Marital status: Widowed    Spouse name: Not on file   Number of children: 2   Years of education: Not on file   Highest education level: Not on file  Occupational History   Occupation: Medical illustrator  Employer: UNEMPLOYED  Tobacco Use   Smoking status: Never   Smokeless tobacco: Never  Vaping Use   Vaping Use: Never used  Substance and Sexual Activity   Alcohol use: Yes    Alcohol/week: 3.0 standard drinks    Types: 3 Glasses of wine per week   Drug use: No   Sexual activity: Never  Other Topics Concern   Not on file  Social History Narrative   Not on file   Social Determinants of Health   Financial Resource Strain: Low Risk    Difficulty of Paying Living Expenses: Not hard at all  Food Insecurity: No Food Insecurity   Worried About Charity fundraiser in the Last Year: Never true   [place] in the Last Year: Never true  Transportation Needs: No Transportation Needs   Lack of Transportation (Medical): No   Lack of Transportation (Non-Medical): No  Physical Activity: Sufficiently Active   Days of Exercise per Week: 7 days   Minutes of Exercise per Session: 30 min  Stress: No Stress Concern Present   Feeling of  Stress : Only a little  Social Connections: Moderately Isolated   Frequency of Communication with Friends and Family: More than three times a week   Frequency of Social Gatherings with Friends and Family: More than three times a week   Attends Religious Services: Never   Marine scientist or Organizations: Yes   Attends Music therapist: More than 4 times per year   Marital Status: Widowed  Human resources officer Violence: Not At Risk   Fear of Current or Ex-Partner: No   Emotionally Abused: No   Physically Abused: No   Sexually Abused: No    Outpatient Medications Prior to Visit  Medication Sig Dispense Refill   acetaminophen (TYLENOL) 500 MG tablet Take 500 mg by mouth every 4 (four) hours as needed for fever.     alendronate (FOSAMAX) 70 MG tablet TAKE 1 TABLET BY MOUTH 1 TIME A WEEK WITH FULL GLASS OF WATER AND ON AN EMPTY STOMACH 12 tablet 1   amLODipine (NORVASC) 5 MG tablet TAKE 1 TABLET(5 MG) BY MOUTH DAILY 90 tablet 1   anastrozole (ARIMIDEX) 1 MG tablet Take 1 tablet (1 mg total) by mouth daily. 90 tablet 4   cholecalciferol (VITAMIN D) 1000 units tablet Take 1,000 Units by mouth daily.     levothyroxine (SYNTHROID) 75 MCG tablet TAKE 1 TABLET(75 MCG) BY MOUTH DAILY 90 tablet 3   rosuvastatin (CRESTOR) 10 MG tablet TAKE 1 TABLET BY MOUTH DAILY 90 tablet 3   No facility-administered medications prior to visit.    Allergies  Allergen Reactions   Codeine Other (See Comments)    Reaction:  Severe headaches    Penicillins Hives and Other (See Comments)    Has patient had a PCN reaction causing immediate rash, facial/tongue/throat swelling, SOB or lightheadedness with hypotension: No Has patient had a PCN reaction causing severe rash involving mucus membranes or skin necrosis: No Has patient had a PCN reaction that required hospitalization No Has patient had a PCN reaction occurring within the last 10 years: No If all of the above answers are "NO", then may  proceed with Cephalosporin use.   Adhesive [Tape] Rash    ROS Review of Systems  Constitutional: Negative.   HENT: Negative.    Eyes:  Negative for photophobia and visual disturbance.  Respiratory: Negative.    Cardiovascular: Negative.  Negative for palpitations.  Gastrointestinal: Negative.  Endocrine: Negative for polyphagia and polyuria.  Genitourinary: Negative.   Musculoskeletal:  Negative for gait problem and joint swelling.  Skin: Negative.   Neurological:  Negative for dizziness, light-headedness and headaches.  Psychiatric/Behavioral:  Negative for dysphoric mood. The patient is not nervous/anxious.      Objective:    Physical Exam Vitals and nursing note reviewed.  Constitutional:      General: She is not in acute distress.    Appearance: Normal appearance. She is normal weight. She is not ill-appearing, toxic-appearing or diaphoretic.  HENT:     Head: Normocephalic and atraumatic.     Right Ear: Tympanic membrane, ear canal and external ear normal.     Left Ear: Tympanic membrane, ear canal and external ear normal.     Mouth/Throat:     Mouth: Mucous membranes are moist.     Pharynx: Oropharynx is clear. No oropharyngeal exudate or posterior oropharyngeal erythema.  Eyes:     General:        Right eye: No discharge.        Left eye: No discharge.     Extraocular Movements: Extraocular movements intact.     Conjunctiva/sclera: Conjunctivae normal.     Pupils: Pupils are equal, round, and reactive to light.  Neck:     Vascular: No carotid bruit.  Cardiovascular:     Rate and Rhythm: Normal rate and regular rhythm.     Heart sounds: Murmur heard.  Systolic murmur is present with a grade of 4/6.  Pulmonary:     Effort: Pulmonary effort is normal.     Breath sounds: Normal breath sounds.  Abdominal:     General: Bowel sounds are normal.  Musculoskeletal:     Cervical back: No rigidity or tenderness.  Lymphadenopathy:     Cervical: No cervical adenopathy.   Skin:    General: Skin is warm and dry.  Neurological:     Mental Status: She is alert and oriented to person, place, and time.  Psychiatric:        Mood and Affect: Mood normal.        Behavior: Behavior normal.    BP (!) 156/70 (BP Location: Left Arm, Patient Position: Sitting, Cuff Size: Normal)   Pulse 71   Temp (!) 97.5 F (36.4 C) (Temporal)   Ht 5' (1.524 m)   Wt 126 lb 9.6 oz (57.4 kg)   SpO2 98%   BMI 24.72 kg/m  Wt Readings from Last 3 Encounters:  11/29/20 126 lb 9.6 oz (57.4 kg)  05/28/20 127 lb (57.6 kg)  05/24/20 127 lb 3.2 oz (57.7 kg)     Health Maintenance Due  Topic Date Due   Hepatitis C Screening  Never done    There are no preventive care reminders to display for this patient.  Lab Results  Component Value Date   TSH 1.24 05/24/2020   Lab Results  Component Value Date   WBC 5.9 07/02/2020   HGB 13.6 07/02/2020   HCT 40.2 07/02/2020   MCV 90.3 07/02/2020   PLT 273 07/02/2020   Lab Results  Component Value Date   NA 139 05/24/2020   K 3.7 05/24/2020   CHLORIDE 108 05/05/2013   CO2 26 05/24/2020   GLUCOSE 100 (H) 05/24/2020   BUN 12 05/24/2020   CREATININE 0.82 05/24/2020   BILITOT 0.8 05/24/2020   ALKPHOS 71 05/24/2020   AST 25 05/24/2020   ALT 13 05/24/2020   PROT 7.5 05/24/2020   ALBUMIN 4.9 05/24/2020  CALCIUM 9.8 05/24/2020   ANIONGAP 9 07/19/2019   GFR 68.74 05/24/2020   Lab Results  Component Value Date   CHOL 226 (H) 05/24/2020   Lab Results  Component Value Date   HDL 82.50 05/24/2020   Lab Results  Component Value Date   LDLCALC 126 (H) 05/24/2020   Lab Results  Component Value Date   TRIG 86.0 05/24/2020   Lab Results  Component Value Date   CHOLHDL 3 05/24/2020   Lab Results  Component Value Date   HGBA1C 5.9 03/04/2018      Assessment & Plan:   Problem List Items Addressed This Visit       Cardiovascular and Mediastinum   Essential hypertension - Primary   Relevant Orders   CBC    Comprehensive metabolic panel   Urinalysis, Routine w reflex microscopic   VSD (ventricular septal defect)     Endocrine   Hypothyroidism   Relevant Orders   TSH     Other   Hyperlipidemia   Relevant Orders   Comprehensive metabolic panel   Lipid panel    No orders of the defined types were placed in this encounter.   Follow-up: No follow-ups on file.   Information given on managing blood pressure and how to take her pressure.  I asked her to can continue periodic checks. Libby Maw, MD

## 2020-12-02 ENCOUNTER — Telehealth: Payer: Self-pay | Admitting: Family Medicine

## 2020-12-02 DIAGNOSIS — R8290 Unspecified abnormal findings in urine: Secondary | ICD-10-CM

## 2020-12-02 MED ORDER — LEVOTHYROXINE SODIUM 50 MCG PO TABS
50.0000 ug | ORAL_TABLET | Freq: Every day | ORAL | 3 refills | Status: DC
Start: 2020-12-02 — End: 2021-03-03

## 2020-12-02 MED ORDER — NITROFURANTOIN MONOHYD MACRO 100 MG PO CAPS: 100.0000 mg | ORAL_CAPSULE | Freq: Two times a day (BID) | ORAL | 0 refills | Status: AC

## 2020-12-02 NOTE — Telephone Encounter (Signed)
Pt was seen on 11/29/20 and labs were taken. From her Urinalysis. She feels like she has a UTI. She would like [place] to send over an antibiotic to help with this issue.   [id number] [address], [address] AT [facility] [address]  [place], [place] [address]  Phone:  [phone]  Fax:  [phone]  DEA #:  [id number]

## 2020-12-02 NOTE — Addendum Note (Signed)
Addended by: Jon Billings on: 12/02/2020 08:07 AM   Modules accepted: Orders

## 2020-12-02 NOTE — Telephone Encounter (Signed)
Please advise message below, patient aware that U/A did show usual hematuria. Per patient she feels as if she has a UTI and would like something sent in to help with this.

## 2020-12-03 NOTE — Telephone Encounter (Signed)
Patient aware Rx sent in  

## 2020-12-09 ENCOUNTER — Ambulatory Visit
Admission: RE | Admit: 2020-12-09 | Discharge: 2020-12-09 | Disposition: A | Discharge status: Home / Self Care | Payer: Medicare Other | Source: Ambulatory Visit | Attending: Internal Medicine | Admitting: Internal Medicine

## 2020-12-09 ENCOUNTER — Other Ambulatory Visit: Payer: Self-pay

## 2020-12-09 DIAGNOSIS — Z1231 Encounter for screening mammogram for malignant neoplasm of breast: Secondary | ICD-10-CM | POA: Diagnosis not present

## 2020-12-09 DIAGNOSIS — C50311 Malignant neoplasm of lower-inner quadrant of right female breast: Secondary | ICD-10-CM

## 2020-12-31 ENCOUNTER — Other Ambulatory Visit: Payer: Self-pay | Admitting: Family Medicine

## 2020-12-31 DIAGNOSIS — I10 Essential (primary) hypertension: Secondary | ICD-10-CM

## 2021-02-19 ENCOUNTER — Other Ambulatory Visit: Payer: Self-pay | Admitting: Internal Medicine

## 2021-02-19 DIAGNOSIS — M8589 Other specified disorders of bone density and structure, multiple sites: Secondary | ICD-10-CM

## 2021-02-19 DIAGNOSIS — Z170 Estrogen receptor positive status [ER+]: Secondary | ICD-10-CM

## 2021-02-28 ENCOUNTER — Other Ambulatory Visit: Payer: Self-pay

## 2021-02-28 ENCOUNTER — Ambulatory Visit (INDEPENDENT_AMBULATORY_CARE_PROVIDER_SITE_OTHER): Payer: Medicare Other | Admitting: Family Medicine

## 2021-02-28 ENCOUNTER — Encounter: Payer: Self-pay | Admitting: Family Medicine

## 2021-02-28 VITALS — BP 142/72 | HR 71 | Temp 97.0°F | Ht 60.0 in | Wt 128.8 lb

## 2021-02-28 DIAGNOSIS — E559 Vitamin D deficiency, unspecified: Secondary | ICD-10-CM | POA: Diagnosis not present

## 2021-02-28 DIAGNOSIS — E7800 Pure hypercholesterolemia, unspecified: Secondary | ICD-10-CM | POA: Diagnosis not present

## 2021-02-28 DIAGNOSIS — E039 Hypothyroidism, unspecified: Secondary | ICD-10-CM | POA: Diagnosis not present

## 2021-02-28 DIAGNOSIS — N029 Recurrent and persistent hematuria with unspecified morphologic changes: Secondary | ICD-10-CM

## 2021-02-28 DIAGNOSIS — I10 Essential (primary) hypertension: Secondary | ICD-10-CM | POA: Diagnosis not present

## 2021-02-28 DIAGNOSIS — Z23 Encounter for immunization: Secondary | ICD-10-CM | POA: Diagnosis not present

## 2021-02-28 LAB — LIPID PANEL
Cholesterol: 195 mg/dL (ref 0–200)
HDL: 82.4 mg/dL (ref >39.00)
LDL Cholesterol: 84 mg/dL (ref 0–99)
NonHDL: 112.54
Total CHOL/HDL Ratio: 2
Triglycerides: 145 mg/dL (ref 0.0–149.0)
VLDL: 29 mg/dL (ref 0.0–40.0)

## 2021-02-28 LAB — VITAMIN D 25 HYDROXY (VIT D DEFICIENCY, FRACTURES): VITD: 39.47 ng/mL (ref 30.00–100.00)

## 2021-02-28 LAB — URINALYSIS, ROUTINE W REFLEX MICROSCOPIC
Bilirubin Urine: NEGATIVE
Ketones, ur: NEGATIVE
Leukocytes,Ua: NEGATIVE
Nitrite: NEGATIVE
Specific Gravity, Urine: <=1.005 — AB (ref 1.000–1.030)
Total Protein, Urine: NEGATIVE
Urine Glucose: NEGATIVE
Urobilinogen, UA: 0.2 (ref 0.0–1.0)
WBC, UA: NONE SEEN (ref 0–?)
pH: 7 (ref 5.0–8.0)

## 2021-02-28 LAB — COMPREHENSIVE METABOLIC PANEL
ALT: 13 U/L (ref 0–35)
AST: 24 U/L (ref 0–37)
Albumin: 4.9 g/dL (ref 3.5–5.2)
Alkaline Phosphatase: 57 U/L (ref 39–117)
BUN: 10 mg/dL (ref 6–23)
CO2: 28 mEq/L (ref 19–32)
Calcium: 10 mg/dL (ref 8.4–10.5)
Chloride: 101 mEq/L (ref 96–112)
Creatinine, Ser: 0.94 mg/dL (ref 0.40–1.20)
GFR: 58.04 mL/min — ABNORMAL LOW (ref >60.00)
Glucose, Bld: 97 mg/dL (ref 70–99)
Potassium: 4.2 mEq/L (ref 3.5–5.1)
Sodium: 139 mEq/L (ref 135–145)
Total Bilirubin: 0.8 mg/dL (ref 0.2–1.2)
Total Protein: 7.5 g/dL (ref 6.0–8.3)

## 2021-02-28 LAB — CBC
HCT: 41.7 % (ref 36.0–46.0)
Hemoglobin: 13.7 g/dL (ref 12.0–15.0)
MCHC: 32.9 g/dL (ref 30.0–36.0)
MCV: 91.5 fl (ref 78.0–100.0)
Platelets: 292 10*3/uL (ref 150.0–400.0)
RBC: 4.56 Mil/uL (ref 3.87–5.11)
RDW: 13.6 % (ref 11.5–15.5)
WBC: 6.5 10*3/uL (ref 4.0–10.5)

## 2021-02-28 LAB — TSH: TSH: 9.04 u[IU]/mL — ABNORMAL HIGH (ref 0.35–5.50)

## 2021-02-28 NOTE — Progress Notes (Signed)
Established Patient Office Visit  Subjective:  Patient ID: Madison Huerta, female    DOB: [birth year]  Age: 81 y.o. MRN: [id number]  CC:  Chief Complaint  Patient presents with   Follow-up    3 month follow up, no concerns.     HPI Madison Huerta presents for follow-up of hypertension, hypothyroidism, elevated cholesterol, vitamin D deficiency Hematuria associated with Arimidex.  May be able to discontinue Arimidex this year.  Continues therapy for osteoporosis per endocrinology.  Brings in an extensive list of blood pressures taken at home that are current clearly in the 120s to 130 range over 60s to 70s.  She has noticed no difference on the lower dose of Synthroid.  Past Medical History:  Diagnosis Date   Breast cancer ([place]) 1994   L breast- lumpectomy, care for at [facility]) 2013   right breast- mastectomy, [facility]   Cataract    Endocarditis    GERD (gastroesophageal reflux disease)    Heart murmur    VSD- echocardiogram - [facility] 10 yrs. ago   History of breast cancer    Hyperlipidemia    Hypertension    Hypothyroidism    Osteopenia    Overweight(278.02)    Personal history of radiation therapy 1994   left breast ca   Thyroid disease    Hypothyroidism   VSD (ventricular septal defect)    does need SBE prophylaxis (Keflex  3 gm 1 hour pprior and 1.5 gm 6 hours post   VSD (ventricular septal defect)     Past Surgical History:  Procedure Laterality Date   2D echo  2004   Normal at [place] Bilateral    removed [place] Left 1994   breast ca   BREAST BIOPSY Right 2013   breast ca   BREAST IMPLANT REMOVAL  1994   BREAST LUMPECTOMY Left 1994   lumpectomy and rad tx and tamoxifen   MASTECTOMY Right 01/08/2012   complete and anastrozole   MASTECTOMY W/ SENTINEL NODE BIOPSY  01/08/2012   Procedure: MASTECTOMY WITH SENTINEL LYMPH NODE BIOPSY;  Surgeon: Madilyn Hook, DO;  Location: [facility];  Service: General;   Laterality: Right;  right breast mastectomy with sentinel lymph node biopsy    PARATHYROIDECTOMY  12/2002   TONSILLECTOMY     As a child   TUBAL LIGATION     1976    Family History  Problem Relation Age of Onset   Aneurysm Mother        brain   Cancer Father        Lung, Liver mets   Lung cancer Father 42   Heart disease Other    Cancer Cousin        Breast CA   Breast cancer Cousin 30   Breast cancer Maternal Aunt 55    Social History   Socioeconomic History   Marital status: Widowed    Spouse name: Not on file   Number of children: 2   Years of education: Not on file   Highest education level: Not on file  Occupational History   Occupation: Animator: UNEMPLOYED  Tobacco Use   Smoking status: Never   Smokeless tobacco: Never  Vaping Use   Vaping Use: Never used  Substance and Sexual Activity   Alcohol use: Yes    Alcohol/week: 3.0 standard drinks    Types: 3 Glasses of wine per  week   Drug use: No   Sexual activity: Never  Other Topics Concern   Not on file  Social History Narrative   Not on file   Social Determinants of Health   Financial Resource Strain: Low Risk    Difficulty of Paying Living Expenses: Not hard at all  Food Insecurity: No Food Insecurity   Worried About Charity fundraiser in the Last Year: Never true   [place] in the Last Year: Never true  Transportation Needs: No Transportation Needs   Lack of Transportation (Medical): No   Lack of Transportation (Non-Medical): No  Physical Activity: Sufficiently Active   Days of Exercise per Week: 7 days   Minutes of Exercise per Session: 30 min  Stress: No Stress Concern Present   Feeling of Stress : Only a little  Social Connections: Moderately Isolated   Frequency of Communication with Friends and Family: More than three times a week   Frequency of Social Gatherings with Friends and Family: More than three times a week   Attends Religious Services: Never    Marine scientist or Organizations: Yes   Attends Music therapist: More than 4 times per year   Marital Status: Widowed  Human resources officer Violence: Not At Risk   Fear of Current or Ex-Partner: No   Emotionally Abused: No   Physically Abused: No   Sexually Abused: No    Outpatient Medications Prior to Visit  Medication Sig Dispense Refill   acetaminophen (TYLENOL) 500 MG tablet Take 500 mg by mouth every 4 (four) hours as needed for fever.     alendronate (FOSAMAX) 70 MG tablet TAKE 1 TABLET BY MOUTH 1 TIME A WEEK WITH FULL GLASS OF WATER AND ON AN EMPTY STOMACH 12 tablet 1   amLODipine (NORVASC) 5 MG tablet TAKE 1 TABLET(5 MG) BY MOUTH DAILY 90 tablet 1   anastrozole (ARIMIDEX) 1 MG tablet Take 1 tablet (1 mg total) by mouth daily. 90 tablet 4   cholecalciferol (VITAMIN D) 1000 units tablet Take 1,000 Units by mouth daily.     levothyroxine (SYNTHROID) 50 MCG tablet Take 1 tablet (50 mcg total) by mouth daily. 90 tablet 3   rosuvastatin (CRESTOR) 10 MG tablet TAKE 1 TABLET BY MOUTH DAILY 90 tablet 3   No facility-administered medications prior to visit.    Allergies  Allergen Reactions   Codeine Other (See Comments)    Reaction:  Severe headaches    Penicillins Hives and Other (See Comments)    Has patient had a PCN reaction causing immediate rash, facial/tongue/throat swelling, SOB or lightheadedness with hypotension: No Has patient had a PCN reaction causing severe rash involving mucus membranes or skin necrosis: No Has patient had a PCN reaction that required hospitalization No Has patient had a PCN reaction occurring within the last 10 years: No If all of the above answers are "NO", then may proceed with Cephalosporin use.   Adhesive [Tape] Rash    ROS Review of Systems  Constitutional:  Negative for chills, diaphoresis, fatigue, fever and unexpected weight change.  HENT: Negative.    Eyes:  Negative for photophobia and visual disturbance.  Respiratory:  Negative.    Cardiovascular: Negative.   Gastrointestinal: Negative.   Endocrine: Negative for polyphagia and polyuria.  Genitourinary: Negative.   Musculoskeletal:  Negative for gait problem and joint swelling.  Skin: Negative.   Neurological:  Negative for speech difficulty and weakness.  Hematological:  Does not bruise/bleed easily.  Psychiatric/Behavioral: Negative.       Objective:    Physical Exam Vitals and nursing note reviewed.  Constitutional:      General: She is not in acute distress.    Appearance: Normal appearance. She is normal weight. She is not ill-appearing, toxic-appearing or diaphoretic.  HENT:     Head: Normocephalic and atraumatic.     Right Ear: Tympanic membrane, ear canal and external ear normal.     Left Ear: Tympanic membrane, ear canal and external ear normal.     Mouth/Throat:     Mouth: Mucous membranes are moist.     Pharynx: Oropharynx is clear. No oropharyngeal exudate or posterior oropharyngeal erythema.  Eyes:     General: No scleral icterus.       Right eye: No discharge.        Left eye: No discharge.     Extraocular Movements: Extraocular movements intact.     Conjunctiva/sclera: Conjunctivae normal.     Pupils: Pupils are equal, round, and reactive to light.  Neck:     Vascular: No carotid bruit.  Cardiovascular:     Rate and Rhythm: Normal rate and regular rhythm.     Heart sounds: Murmur heard.  Pulmonary:     Effort: Pulmonary effort is normal.     Breath sounds: Normal breath sounds.  Abdominal:     General: Bowel sounds are normal.  Musculoskeletal:     Cervical back: No rigidity or tenderness.  Lymphadenopathy:     Cervical: No cervical adenopathy.  Skin:    General: Skin is warm and dry.  Neurological:     Mental Status: She is alert and oriented to person, place, and time.  Psychiatric:        Mood and Affect: Mood normal.        Behavior: Behavior normal.    BP (!) 142/72 (BP Location: Right Arm, Patient  Position: Sitting, Cuff Size: Normal)    Pulse 71    Temp (!) 97 F (36.1 C) (Temporal)    Ht 5' (1.524 m)    Wt 128 lb 12.8 oz (58.4 kg)    SpO2 97%    BMI 25.15 kg/m  Wt Readings from Last 3 Encounters:  02/28/21 128 lb 12.8 oz (58.4 kg)  11/29/20 126 lb 9.6 oz (57.4 kg)  05/28/20 127 lb (57.6 kg)     Health Maintenance Due  Topic Date Due   Hepatitis C Screening  Never done   COVID-19 Vaccine (5 - Booster for Pfizer series) 12/17/2020   TETANUS/TDAP  02/17/2021    There are no preventive care reminders to display for this patient.  Lab Results  Component Value Date   TSH 0.10 (L) 11/29/2020   Lab Results  Component Value Date   WBC 6.9 11/29/2020   HGB 13.9 11/29/2020   HCT 41.7 11/29/2020   MCV 90.5 11/29/2020   PLT 280.0 11/29/2020   Lab Results  Component Value Date   NA 140 11/29/2020   K 4.1 11/29/2020   CHLORIDE 108 05/05/2013   CO2 27 11/29/2020   GLUCOSE 104 (H) 11/29/2020   BUN 11 11/29/2020   CREATININE 0.79 11/29/2020   BILITOT 0.8 11/29/2020   ALKPHOS 61 11/29/2020   AST 26 11/29/2020   ALT 13 11/29/2020   PROT 7.3 11/29/2020   ALBUMIN 4.8 11/29/2020   CALCIUM 9.7 11/29/2020   ANIONGAP 9 07/19/2019   GFR 71.63 11/29/2020   Lab Results  Component Value Date   CHOL 188  11/29/2020   Lab Results  Component Value Date   HDL 77.30 11/29/2020   Lab Results  Component Value Date   LDLCALC 87 11/29/2020   Lab Results  Component Value Date   TRIG 117.0 11/29/2020   Lab Results  Component Value Date   CHOLHDL 2 11/29/2020   Lab Results  Component Value Date   HGBA1C 5.9 03/04/2018      Assessment & Plan:   Problem List Items Addressed This Visit       Cardiovascular and Mediastinum   Essential hypertension - Primary   Relevant Orders   CBC   Comprehensive metabolic panel     Endocrine   Hypothyroidism   Relevant Orders   TSH     Genitourinary   Benign hematuria   Relevant Orders   Urinalysis, Routine w reflex  microscopic     Other   Vitamin D deficiency   Relevant Orders   VITAMIN D 25 Hydroxy (Vit-D Deficiency, Fractures)   Hyperlipidemia   Relevant Orders   Comprehensive metabolic panel   Lipid panel   Other Visit Diagnoses     Need for Tdap vaccination       Relevant Orders   Tdap vaccine greater than or equal to 7yo IM       No orders of the defined types were placed in this encounter. The 10-year ASCVD risk score (Arnett DK, et al., 2019) is: 35%   Values used to calculate the score:     Age: 45 years     Sex: Female     Is Non-Hispanic African American: No     Diabetic: No     Tobacco smoker: No     Systolic Blood Pressure: 867 mmHg     Is BP treated: Yes     HDL Cholesterol: 77.3 mg/dL     Total Cholesterol: 188 mg/dL   Follow-up: Return in about 6 months (around 08/28/2021), or if symptoms worsen or fail to improve.  Blood pressures well controlled on amlodipine.  Continue simvastatin school.  Libby Maw, MD

## 2021-03-03 MED ORDER — LEVOTHYROXINE SODIUM 75 MCG PO TABS: 75.0000 ug | ORAL_TABLET | Freq: Every day | ORAL | 3 refills | Status: DC

## 2021-03-03 NOTE — Addendum Note (Signed)
Addended by: Jon Billings on: 03/03/2021 07:41 AM   Modules accepted: Orders

## 2021-06-02 ENCOUNTER — Ambulatory Visit: Payer: Medicare Other | Admitting: Family Medicine

## 2021-06-03 ENCOUNTER — Ambulatory Visit: Payer: Medicare Other

## 2021-06-13 ENCOUNTER — Other Ambulatory Visit: Payer: Self-pay | Admitting: Family Medicine

## 2021-06-13 DIAGNOSIS — I10 Essential (primary) hypertension: Secondary | ICD-10-CM

## 2021-06-17 ENCOUNTER — Telehealth: Payer: Self-pay | Admitting: Family Medicine

## 2021-06-17 NOTE — Telephone Encounter (Signed)
Left message for patient to call back and schedule Medicare Annual Wellness Visit (AWV).  ? ?Please offer to do virtually or by telephone.  Left office number and my jabber [phone]. ? ?Last AWV:05/28/2020 ? ?Please schedule at anytime with Nurse Health Advisor. ?  ?

## 2021-06-25 ENCOUNTER — Ambulatory Visit (INDEPENDENT_AMBULATORY_CARE_PROVIDER_SITE_OTHER): Payer: Medicare Other

## 2021-06-25 DIAGNOSIS — Z0000 Encounter for general adult medical examination without abnormal findings: Secondary | ICD-10-CM | POA: Diagnosis not present

## 2021-06-25 NOTE — Patient Instructions (Signed)
Ms. Samad , ?Thank you for taking time to come for your Medicare Wellness Visit. I appreciate your ongoing commitment to your health goals. Please review the following plan we discussed and let me know if I can assist you in the future.  ? ?Screening recommendations/referrals: ?Colonoscopy: no longer required  ?Mammogram: no longer required  ?Bone Density: 06/25/2020 ?Recommended yearly ophthalmology/optometry visit for glaucoma screening and checkup ?Recommended yearly dental visit for hygiene and checkup ? ?Vaccinations: ?Influenza vaccine: completed  ?Pneumococcal vaccine: completed  ?Tdap vaccine: 02/28/2021 ?Shingles vaccine: completed    ? ?Advanced directives: yes  ? ?Conditions/risks identified: none  ? ?Next appointment: none  ? ? ?Preventive Care 3 Years and Older, Female ?Preventive care refers to lifestyle choices and visits with your health care provider that can promote health and wellness. ?What does preventive care include? ?A yearly physical exam. This is also called an annual well check. ?Dental exams once or twice a year. ?Routine eye exams. Ask your health care provider how often you should have your eyes checked. ?Personal lifestyle choices, including: ?Daily care of your teeth and gums. ?Regular physical activity. ?Eating a healthy diet. ?Avoiding tobacco and drug use. ?Limiting alcohol use. ?Practicing safe sex. ?Taking low-dose aspirin every day. ?Taking vitamin and mineral supplements as recommended by your health care provider. ?What happens during an annual well check? ?The services and screenings done by your health care provider during your annual well check will depend on your age, overall health, lifestyle risk factors, and family history of disease. ?Counseling  ?Your health care provider may ask you questions about your: ?Alcohol use. ?Tobacco use. ?Drug use. ?Emotional well-being. ?Home and relationship well-being. ?Sexual activity. ?Eating habits. ?History of falls. ?Memory and  ability to understand (cognition). ?Work and work Statistician. ?Reproductive health. ?Screening  ?You may have the following tests or measurements: ?Height, weight, and BMI. ?Blood pressure. ?Lipid and cholesterol levels. These may be checked every 5 years, or more frequently if you are over 58 years old. ?Skin check. ?Lung cancer screening. You may have this screening every year starting at age 75 if you have a 30-pack-year history of smoking and currently smoke or have quit within the past 15 years. ?Fecal occult blood test (FOBT) of the stool. You may have this test every year starting at age 56. ?Flexible sigmoidoscopy or colonoscopy. You may have a sigmoidoscopy every 5 years or a colonoscopy every 10 years starting at age 36. ?Hepatitis C blood test. ?Hepatitis B blood test. ?Sexually transmitted disease (STD) testing. ?Diabetes screening. This is done by checking your blood sugar (glucose) after you have not eaten for a while (fasting). You may have this done every 1-3 years. ?Bone density scan. This is done to screen for osteoporosis. You may have this done starting at age 70. ?Mammogram. This may be done every 1-2 years. Talk to your health care provider about how often you should have regular mammograms. ?Talk with your health care provider about your test results, treatment options, and if necessary, the need for more tests. ?Vaccines  ?Your health care provider may recommend certain vaccines, such as: ?Influenza vaccine. This is recommended every year. ?Tetanus, diphtheria, and acellular pertussis (Tdap, Td) vaccine. You may need a Td booster every 10 years. ?Zoster vaccine. You may need this after age 21. ?Pneumococcal 13-valent conjugate (PCV13) vaccine. One dose is recommended after age 50. ?Pneumococcal polysaccharide (PPSV23) vaccine. One dose is recommended after age 34. ?Talk to your health care provider about which screenings and vaccines  you need and how often you need them. ?This information is  not intended to replace advice given to you by your health care provider. Make sure you discuss any questions you have with your health care provider. ?Document Released: 03/08/2015 Document Revised: 10/30/2015 Document Reviewed: 12/11/2014 ?Elsevier Interactive Patient Education ? 2017 [place]. ? ?Fall Prevention in the Home ?Falls can cause injuries. They can happen to people of all ages. There are many things you can do to make your home safe and to help prevent falls. ?What can I do on the outside of my home? ?Regularly fix the edges of walkways and driveways and fix any cracks. ?Remove anything that might make you trip as you walk through a door, such as a raised step or threshold. ?Trim any bushes or trees on the path to your home. ?Use bright outdoor lighting. ?Clear any walking paths of anything that might make someone trip, such as rocks or tools. ?Regularly check to see if handrails are loose or broken. Make sure that both sides of any steps have handrails. ?Any raised decks and porches should have guardrails on the edges. ?Have any leaves, snow, or ice cleared regularly. ?Use sand or salt on walking paths during winter. ?Clean up any spills in your garage right away. This includes oil or grease spills. ?What can I do in the bathroom? ?Use night lights. ?Install grab bars by the toilet and in the tub and shower. Do not use towel bars as grab bars. ?Use non-skid mats or decals in the tub or shower. ?If you need to sit down in the shower, use a plastic, non-slip stool. ?Keep the floor dry. Clean up any water that spills on the floor as soon as it happens. ?Remove soap buildup in the tub or shower regularly. ?Attach bath mats securely with double-sided non-slip rug tape. ?Do not have throw rugs and other things on the floor that can make you trip. ?What can I do in the bedroom? ?Use night lights. ?Make sure that you have a light by your bed that is easy to reach. ?Do not use any sheets or blankets that  are too big for your bed. They should not hang down onto the floor. ?Have a firm chair that has side arms. You can use this for support while you get dressed. ?Do not have throw rugs and other things on the floor that can make you trip. ?What can I do in the kitchen? ?Clean up any spills right away. ?Avoid walking on wet floors. ?Keep items that you use a lot in easy-to-reach places. ?If you need to reach something above you, use a strong step stool that has a grab bar. ?Keep electrical cords out of the way. ?Do not use floor polish or wax that makes floors slippery. If you must use wax, use non-skid floor wax. ?Do not have throw rugs and other things on the floor that can make you trip. ?What can I do with my stairs? ?Do not leave any items on the stairs. ?Make sure that there are handrails on both sides of the stairs and use them. Fix handrails that are broken or loose. Make sure that handrails are as long as the stairways. ?Check any carpeting to make sure that it is firmly attached to the stairs. Fix any carpet that is loose or worn. ?Avoid having throw rugs at the top or bottom of the stairs. If you do have throw rugs, attach them to the floor with carpet tape. ?Make sure  that you have a light switch at the top of the stairs and the bottom of the stairs. If you do not have them, ask someone to add them for you. ?What else can I do to help prevent falls? ?Wear shoes that: ?Do not have high heels. ?Have rubber bottoms. ?Are comfortable and fit you well. ?Are closed at the toe. Do not wear sandals. ?If you use a stepladder: ?Make sure that it is fully opened. Do not climb a closed stepladder. ?Make sure that both sides of the stepladder are locked into place. ?Ask someone to hold it for you, if possible. ?Clearly mark and make sure that you can see: ?Any grab bars or handrails. ?First and last steps. ?Where the edge of each step is. ?Use tools that help you move around (mobility aids) if they are needed. These  include: ?Canes. ?Walkers. ?Scooters. ?Crutches. ?Turn on the lights when you go into a dark area. Replace any light bulbs as soon as they burn out. ?Set up your furniture so you have a clear path. Avoid

## 2021-06-25 NOTE — Progress Notes (Signed)
? ?Subjective:  ? Madison Huerta is a 81 y.o. female who presents for Medicare Annual (Subsequent) preventive examination. ? ? ?I connected with Madison Huerta today by telephone and verified that I am speaking with the correct person using two identifiers. ?Location patient: home ?Location provider: work ?Persons participating in the virtual visit: patient, provider. ?  ?I discussed the limitations, risks, security and privacy concerns of performing an evaluation and management service by telephone and the availability of in person appointments. I also discussed with the patient that there may be a patient responsible charge related to this service. The patient expressed understanding and verbally consented to this telephonic visit.  ?  ?Interactive audio and video telecommunications were attempted between this provider and patient, however failed, due to patient having technical difficulties OR patient did not have access to video capability.  We continued and completed visit with audio only. ? ?  ?Review of Systems    ? ?Cardiac Risk Factors include: advanced age (>10mn, >>36women);dyslipidemia ? ?   ?Objective:  ?  ?Today's Vitals  ? ?There is no height or weight on file to calculate BMI. ? ? ?  06/25/2021  ?  8:57 AM 07/02/2020  ? 10:33 AM 05/28/2020  ?  8:18 AM 07/19/2019  ?  9:40 AM 05/24/2019  ?  8:07 AM 07/15/2018  ?  1:05 PM 03/04/2018  ? 10:13 AM  ?Advanced Directives  ?Does Patient Have a Medical Advance Directive? Yes No Yes Yes Yes Yes Yes  ?Type of AParamedicof ALaceyLiving will  HLimestoneLiving will HMount LebanonLiving will HNubie[place]Living will [place]South PrairieLiving will HLewisvilleLiving will  ?Does patient want to make changes to medical advance directive?     No - Patient declined    ?Copy of [place] Chart? No - copy requested  Yes - validated most recent copy  scanned in chart (See row information)  Yes - validated most recent copy scanned in chart (See row information)  No - copy requested  ?Would patient like information on creating a medical advance directive?  No - Patient declined       ? ? ?Current Medications (verified) ?Outpatient Encounter Medications as of 06/25/2021  ?Medication Sig  ? acetaminophen (TYLENOL) 500 MG tablet Take 500 mg by mouth every 4 (four) hours as needed for fever.  ? alendronate (FOSAMAX) 70 MG tablet TAKE 1 TABLET BY MOUTH 1 TIME A WEEK WITH FULL GLASS OF WATER AND ON AN EMPTY STOMACH  ? amLODipine (NORVASC) 5 MG tablet TAKE 1 TABLET(5 MG) BY MOUTH DAILY  ? anastrozole (ARIMIDEX) 1 MG tablet Take 1 tablet (1 mg total) by mouth daily.  ? cholecalciferol (VITAMIN D) 1000 units tablet Take 1,000 Units by mouth daily.  ? levothyroxine (SYNTHROID) 75 MCG tablet Take 1 tablet (75 mcg total) by mouth daily.  ? rosuvastatin (CRESTOR) 10 MG tablet TAKE 1 TABLET BY MOUTH DAILY  ? ?No facility-administered encounter medications on file as of 06/25/2021.  ? ? ?Allergies (verified) ?Codeine, Penicillins, and Adhesive [tape]  ? ?History: ?Past Medical History:  ?Diagnosis Date  ? [place]  ? [facility]  ? [place]  [place] ? Cataract   ? Endocarditis   ? GERD (gastroesophageal reflux disease)   ? Heart murmur   ? VSD- echocardiogram -  [facility] 10 yrs. ago  ? History of breast cancer   ? Hyperlipidemia   ? Hypertension   ? Hypothyroidism   ? Osteopenia   ? Overweight(278.02)   ? Personal history of radiation therapy 1994  ? left breast ca  ? Thyroid disease   ? Hypothyroidism  ? VSD (ventricular septal defect)   ? does need SBE prophylaxis (Keflex  3 gm 1 hour pprior and 1.5 gm 6 hours post  ? VSD (ventricular septal defect)   ? ?Past Surgical History:  ?Procedure Laterality Date  ? 2D echo  2004  ? [facility] at [facility]  ? AUGMENTATION MAMMAPLASTY Bilateral   ? removed 1994   ? BREAST BIOPSY Left 1994  ? breast ca  ? BREAST BIOPSY Right 2013  ? breast ca  ? BREAST IMPLANT REMOVAL  1994  ? BREAST LUMPECTOMY Left 1994  ? lumpectomy and rad tx and tamoxifen  ? MASTECTOMY Right 01/08/2012  ? complete and anastrozole  ? MASTECTOMY W/ SENTINEL NODE BIOPSY  01/08/2012  ? Procedure: MASTECTOMY WITH SENTINEL LYMPH NODE BIOPSY;  Surgeon: Madilyn Hook, DO;  Location: [facility];  Service: General;  Laterality: Right;  right breast mastectomy with sentinel lymph node biopsy ?  ? PARATHYROIDECTOMY  12/2002  ? TONSILLECTOMY    ? As a child  ? TUBAL LIGATION    ? 1976  ? ?Family History  ?Problem Relation Age of Onset  ? Aneurysm Mother   ?     brain  ? Cancer Father   ?     Lung, Liver mets  ? Lung cancer Father 12  ? Heart disease Other   ? Cancer Cousin   ?     Breast CA  ? Breast cancer Cousin 30  ? Breast cancer Maternal Aunt 45  ? ?Social History  ? ?Socioeconomic History  ? Marital status: Widowed  ?  Spouse name: Not on file  ? Number of children: 2  ? Years of education: Not on file  ? Highest education level: Not on file  ?Occupational History  ? Occupation: Medical illustrator  ?  Employer: UNEMPLOYED  ?Tobacco Use  ? Smoking status: Never  ? Smokeless tobacco: Never  ?Vaping Use  ? Vaping Use: Never used  ?Substance and Sexual Activity  ? Alcohol use: Yes  ?  Alcohol/week: 3.0 standard drinks  ?  Types: 3 Glasses of wine per week  ? Drug use: No  ? Sexual activity: Never  ?Other Topics Concern  ? Not on file  ?Social History Narrative  ? Not on file  ? ?Social Determinants of Health  ? ?Financial Resource Strain: Low Risk   ? Difficulty of Paying Living Expenses: Not hard at all  ?Food Insecurity: No Food Insecurity  ? Worried About Charity fundraiser in the Last Year: Never true  ? Ran Out of Food in the Last Year: Never true  ?Transportation Needs: No Transportation Needs  ? Lack of Transportation (Medical): No  ? Lack of Transportation (Non-Medical): No  ?Physical Activity: Sufficiently Active   ? Days of Exercise per Week: 5 days  ? Minutes of Exercise per Session: 30 min  ?Stress: No Stress Concern Present  ? Feeling of Stress : Not at all  ?Social Connections: Moderately Isolated  ? Frequency of Communication with Friends and Family: Twice a week  ? Frequency of Social Gatherings with Friends and Family: Twice a week  ? Attends Religious Services: Never  ? Active Member of Clubs or  Organizations: Yes  ? Attends Archivist Meetings: 1 to 4 times per year  ? Marital Status: Widowed  ? ? ?Tobacco Counseling ?Counseling given: Not Answered ? ? ?Clinical Intake: ? ?Pre-visit preparation completed: Yes ? ?Pain : No/denies pain ? ?  ? ?Nutritional Risks: None ?Diabetes: No ? ?How often do you need to have someone help you when you read instructions, pamphlets, or other written materials from your doctor or pharmacy?: 1 - Never ?What is the last grade level you completed in school?: High School ? ?Diabetic?no  ? ?Interpreter Needed?: No ? ?Information entered by :: [email or web address] ? ? ?Activities of Daily Living ? ?  06/25/2021  ?  8:58 AM  ?In your present state of health, do you have any difficulty performing the following activities:  ?Hearing? 0  ?Vision? 0  ?Difficulty concentrating or making decisions? 0  ?Walking or climbing stairs? 0  ?Dressing or bathing? 0  ?Doing errands, shopping? 0  ?Preparing Food and eating ? N  ?Using the Toilet? N  ?In the past six months, have you accidently leaked urine? N  ?Do you have problems with loss of bowel control? N  ?Managing your Medications? N  ?Managing your Finances? N  ?Housekeeping or managing your Housekeeping? N  ? ? ?Patient Care Team: ?Libby Maw, MD as PCP - General (Family Medicine) ?Cammie Sickle, MD as Consulting Physician (Internal Medicine) ?Karsten Fells, DDS as Consulting Physician (Dentistry) ?Kathyrn Lass, OD as Consulting Physician (Optometry) ? ?Indicate any recent Medical Services you may have received from other than  [facility] providers in the past year (date may be approximate). ? ?   ?Assessment:  ? This is a routine wellness examination for [place]. ? ?Hearing/Vision screen ?Vision Screening - Comments:: Annual eye exams we

## 2021-07-01 ENCOUNTER — Other Ambulatory Visit: Payer: Self-pay

## 2021-07-01 DIAGNOSIS — C50311 Malignant neoplasm of lower-inner quadrant of right female breast: Secondary | ICD-10-CM

## 2021-07-02 ENCOUNTER — Inpatient Hospital Stay: Payer: Medicare Other | Admitting: Internal Medicine

## 2021-07-02 ENCOUNTER — Inpatient Hospital Stay: Payer: Medicare Other | Attending: Internal Medicine

## 2021-07-02 ENCOUNTER — Encounter: Payer: Self-pay | Admitting: Internal Medicine

## 2021-07-02 DIAGNOSIS — C50311 Malignant neoplasm of lower-inner quadrant of right female breast: Secondary | ICD-10-CM

## 2021-07-02 DIAGNOSIS — Z853 Personal history of malignant neoplasm of breast: Secondary | ICD-10-CM | POA: Diagnosis not present

## 2021-07-02 DIAGNOSIS — Z170 Estrogen receptor positive status [ER+]: Secondary | ICD-10-CM | POA: Diagnosis not present

## 2021-07-02 DIAGNOSIS — M65312 Trigger thumb, left thumb: Secondary | ICD-10-CM | POA: Insufficient documentation

## 2021-07-02 DIAGNOSIS — N952 Postmenopausal atrophic vaginitis: Secondary | ICD-10-CM | POA: Insufficient documentation

## 2021-07-02 DIAGNOSIS — Z801 Family history of malignant neoplasm of trachea, bronchus and lung: Secondary | ICD-10-CM | POA: Insufficient documentation

## 2021-07-02 DIAGNOSIS — R300 Dysuria: Secondary | ICD-10-CM | POA: Diagnosis not present

## 2021-07-02 DIAGNOSIS — Z803 Family history of malignant neoplasm of breast: Secondary | ICD-10-CM | POA: Diagnosis not present

## 2021-07-02 DIAGNOSIS — E876 Hypokalemia: Secondary | ICD-10-CM | POA: Diagnosis not present

## 2021-07-02 DIAGNOSIS — M8580 Other specified disorders of bone density and structure, unspecified site: Secondary | ICD-10-CM | POA: Insufficient documentation

## 2021-07-02 DIAGNOSIS — I10 Essential (primary) hypertension: Secondary | ICD-10-CM | POA: Insufficient documentation

## 2021-07-02 LAB — CBC WITH DIFFERENTIAL/PLATELET
Abs Immature Granulocytes: 0.03 10*3/uL (ref 0.00–0.07)
Basophils Absolute: 0.1 10*3/uL (ref 0.0–0.1)
Basophils Relative: 1 %
Eosinophils Absolute: 0.1 10*3/uL (ref 0.0–0.5)
Eosinophils Relative: 1 %
HCT: 38.9 % (ref 36.0–46.0)
Hemoglobin: 13.1 g/dL (ref 12.0–15.0)
Immature Granulocytes: 0 %
Lymphocytes Relative: 18 %
Lymphs Abs: 1.4 10*3/uL (ref 0.7–4.0)
MCH: 30.1 pg (ref 26.0–34.0)
MCHC: 33.7 g/dL (ref 30.0–36.0)
MCV: 89.4 fL (ref 80.0–100.0)
Monocytes Absolute: 0.6 10*3/uL (ref 0.1–1.0)
Monocytes Relative: 8 %
Neutro Abs: 5.5 10*3/uL (ref 1.7–7.7)
Neutrophils Relative %: 72 %
Platelets: 286 10*3/uL (ref 150–400)
RBC: 4.35 MIL/uL (ref 3.87–5.11)
RDW: 12.5 % (ref 11.5–15.5)
WBC: 7.7 10*3/uL (ref 4.0–10.5)
nRBC: 0 % (ref 0.0–0.2)

## 2021-07-02 LAB — COMPREHENSIVE METABOLIC PANEL
ALT: 14 U/L (ref 0–44)
AST: 25 U/L (ref 15–41)
Albumin: 4.3 g/dL (ref 3.5–5.0)
Alkaline Phosphatase: 65 U/L (ref 38–126)
Anion gap: 8 (ref 5–15)
BUN: 13 mg/dL (ref 8–23)
CO2: 25 mmol/L (ref 22–32)
Calcium: 9 mg/dL (ref 8.9–10.3)
Chloride: 102 mmol/L (ref 98–111)
Creatinine, Ser: 0.87 mg/dL (ref 0.44–1.00)
GFR, Estimated: >60 mL/min (ref >60)
Glucose, Bld: 122 mg/dL — ABNORMAL HIGH (ref 70–99)
Potassium: 3.2 mmol/L — ABNORMAL LOW (ref 3.5–5.1)
Sodium: 135 mmol/L (ref 135–145)
Total Bilirubin: 1 mg/dL (ref 0.3–1.2)
Total Protein: 7.1 g/dL (ref 6.5–8.1)

## 2021-07-02 NOTE — Progress Notes (Signed)
[place] ?OFFICE PROGRESS NOTE ? ?Patient Care Team: ?Libby Maw, MD as PCP - General (Family Medicine) ?Cammie Sickle, MD as Consulting Physician (Internal Medicine) ?Karsten Fells, DDS as Consulting Physician (Dentistry) ?Kathyrn Lass, OD as Consulting Physician (Optometry) ? ? 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 ? ? ?Oncology History Overview Note  ?# 1994- LEFT BREAST CA s/p Lumpec; RT- Tam ? ?# OCT 2013-  RIGHT BREAST CA pT1c [multifocal-1.2cm s/p Mastec] pN0 ER/PR-pos;Her 2Neu-NEG;Dec 2013- Arimidex; mammo-Sep 2017-N; BREAST CANCER INDEX- BENEFIT from extended AI.  Stopped anastrozole- MAY 2023.  ? ?# SEP 2018- DEXA scan- T score= -1.8 ? ?# BRCA 1&2/ others NEGATIVE [april 0349] ; endocarditis [post dental ] 2015 ?--------------------------------------------  ? ?DIAGNOSIS: [place].[facility] ] right breast cancer ? ?STAGE: I   ;GOALS: curative ? ?CURRENT/MOST RECENT THERAPY; arimidex ? ?  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 ?  ? ?INTERVAL HISTORY: Ambulating independently.  Alone. ? ?Madison Huerta 81 y.o.  female pleasant patient above history of stage I breast cancer is here for follow-up. ? ?Complains of trigger finger of the left thumb.  Mild joint pains. ? 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 ? ?Patient denies any blood in stools or black or stools.  Denies any reflux.  No bone pain.  No shortness of breath cough.  No hot flashes. ? ? ?Review of Systems  ?Constitutional:  Negative for chills, diaphoresis, fever, malaise/fatigue and weight loss.  ?HENT:  Negative for nosebleeds and sore throat.   ?Eyes:  Negative for double vision.  ?Respiratory:  Negative for cough, hemoptysis, sputum production, shortness of breath  and wheezing.   ?Cardiovascular:  Negative for chest pain, palpitations, orthopnea and leg swelling.  ?Gastrointestinal:  Negative for abdominal pain, blood in stool, constipation, diarrhea, heartburn, melena, nausea and vomiting.  ?Genitourinary:  Negative for dysuria, frequency and urgency.  ?Musculoskeletal:  Negative for back pain and joint pain.  ?Skin: Negative.  Negative for itching and rash.  ?Neurological:  Negative for dizziness, tingling, focal weakness, weakness and headaches.  ?Endo/Heme/Allergies:  Does not bruise/bleed easily.  ?Psychiatric/Behavioral:  Negative for depression. The patient is not nervous/anxious and does not have insomnia.   ?  ? ?PAST MEDICAL HISTORY :  ?Past Medical History:  ?Diagnosis Date  ? Breast cancer ([place]) 1994  ? L breast- lumpectomy, care for at [facility]   ? Breast cancer ([place]) 2013  ? right breast- mastectomy, care at [place]  ? Cataract   ? Endocarditis   ? GERD (gastroesophageal reflux disease)   ? Heart murmur   ? VSD- echocardiogram - [facility] 10 yrs. ago  ? History of breast cancer   ? Hyperlipidemia   ? Hypertension   ? Hypothyroidism   ? Osteopenia   ? Overweight(278.02)   ? Personal history of radiation therapy 1994  ? left breast ca  ? Thyroid disease   ? Hypothyroidism  ? VSD (ventricular septal defect)   ? does need SBE prophylaxis (Keflex  3 gm 1 hour pprior and 1.5 gm 6 hours post  ? VSD (ventricular septal defect)   ? ? ?PAST SURGICAL HISTORY :   ?Past Surgical History:  ?Procedure Laterality Date  ? 2D echo  2004  ? Normal at [facility]  ? AUGMENTATION MAMMAPLASTY Bilateral   ?  removed 1994  ? BREAST BIOPSY Left 1994  ? breast ca  ? BREAST BIOPSY Right 2013  ? breast ca  ? BREAST IMPLANT REMOVAL  1994  ? BREAST LUMPECTOMY Left 1994  ? lumpectomy and rad tx and tamoxifen  ? MASTECTOMY Right 01/08/2012  ? complete and anastrozole  ? MASTECTOMY W/ SENTINEL NODE BIOPSY  01/08/2012  ? Procedure: MASTECTOMY WITH SENTINEL LYMPH NODE BIOPSY;  Surgeon: Madilyn Hook, DO;  Location: [facility];  Service: General;  Laterality: Right;  right breast mastectomy with sentinel lymph node biopsy ?  ? PARATHYROIDECTOMY  12/2002  ? TONSILLECTOMY    ? As a child  ? TUBAL LIGATION    ? 1976  ? ? ?FAMILY HISTORY :   ?Family History  ?Problem Relation Age of Onset  ? Aneurysm Mother   ?     brain  ? Cancer Father   ?     Lung, Liver mets  ? Lung cancer Father 12  ? Heart disease Other   ? Cancer Cousin   ?     Breast CA  ? Breast cancer Cousin 30  ? Breast cancer Maternal Aunt 38  ? ? ?SOCIAL HISTORY:   ?Social History  ? ?Tobacco Use  ? Smoking status: Never  ? Smokeless tobacco: Never  ?Vaping Use  ? Vaping Use: Never used  ?Substance Use Topics  ? Alcohol use: Yes  ?  Alcohol/week: 3.0 standard drinks  ?  Types: 3 Glasses of wine per week  ? Drug use: No  ? ? ?ALLERGIES:  is allergic to codeine, penicillins, and adhesive [tape]. ? ?MEDICATIONS:  ?Current Outpatient Medications  ?Medication Sig Dispense Refill  ? acetaminophen (TYLENOL) 500 MG tablet Take 500 mg by mouth every 4 (four) hours as needed for fever.    ? alendronate (FOSAMAX) 70 MG tablet TAKE 1 TABLET BY MOUTH 1 TIME A WEEK WITH FULL GLASS OF WATER AND ON AN EMPTY STOMACH 12 tablet 1  ? amLODipine (NORVASC) 5 MG tablet TAKE 1 TABLET(5 MG) BY MOUTH DAILY 90 tablet 1  ? anastrozole (ARIMIDEX) 1 MG tablet Take 1 tablet (1 mg total) by mouth daily. 90 tablet 4  ? cholecalciferol (VITAMIN D) 1000 units tablet Take 1,000 Units by mouth daily.    ? levothyroxine (SYNTHROID) 75 MCG tablet Take 1 tablet (75 mcg total) by mouth daily. 90 tablet 3  ? rosuvastatin (CRESTOR) 10 MG tablet TAKE 1 TABLET BY MOUTH DAILY 90 tablet 3  ? ?No current facility-administered medications for this visit.  ? ? ?PHYSICAL EXAMINATION: ?ECOG PERFORMANCE STATUS: 0 - Asymptomatic ? ?BP (!) 179/67 (BP Location: Right Arm, Patient Position: Sitting, Cuff Size: Normal)   Pulse 77   Temp 98.7 ?F (37.1 ?C) (Tympanic)   Ht 5' (1.524 m)   Wt 127 lb 3.2 oz (57.7 kg)   SpO2 100%    BMI 24.84 kg/m?  ? ?Filed Weights  ? 07/02/21 1013  ?Weight: 127 lb 3.2 oz (57.7 kg)  ? ? ?Physical Exam ?HENT:  ?   Head: Normocephalic and atraumatic.  ?   Mouth/Throat:  ?   Pharynx: No oropharyngeal exudate.  ?Eyes:  ?   Pupils: Pupils are equal, round, and reactive to light.  ?Cardiovascular:  ?   Rate and Rhythm: Normal rate and regular rhythm.  ?Pulmonary:  ?   Effort: No respiratory distress.  ?   Breath sounds: No wheezing.  ?Abdominal:  ?   General: Bowel sounds are normal. There is  no distension.  ?   Palpations: Abdomen is soft. There is no mass.  ?   Tenderness: There is no abdominal tenderness. There is no guarding or rebound.  ?Musculoskeletal:     ?   General: No tenderness. Normal range of motion.  ?   Cervical back: Normal range of motion and neck supple.  ?Skin: ?   General: Skin is warm.  ?Neurological:  ?   Mental Status: She is alert and oriented to person, place, and time.  ?Psychiatric:     ?   Mood and Affect: Affect normal.  ? ? ? ? ?LABORATORY DATA:  ?I have reviewed the data as listed ?   ?Component Value Date/Time  ? NA 135 07/02/2021 1014  ? NA 144 05/05/2013 0807  ? K 3.2 (L) 07/02/2021 1014  ? K 3.8 05/05/2013 0807  ? CL 102 07/02/2021 1014  ? CL 105 05/06/2012 1433  ? CO2 25 07/02/2021 1014  ? CO2 25 05/05/2013 0807  ? GLUCOSE 122 (H) 07/02/2021 1014  ? GLUCOSE 97 05/05/2013 0807  ? GLUCOSE 93 05/06/2012 1433  ? BUN 13 07/02/2021 1014  ? BUN 15.6 05/05/2013 0807  ? CREATININE 0.87 07/02/2021 1014  ? CREATININE 1.06 11/13/2013 1247  ? CREATININE 1.0 05/05/2013 0807  ? CALCIUM 9.0 07/02/2021 1014  ? CALCIUM 9.7 05/05/2013 0807  ? PROT 7.1 07/02/2021 1014  ? PROT 8.0 11/13/2013 1247  ? PROT 7.8 05/05/2013 0807  ? ALBUMIN 4.3 07/02/2021 1014  ? ALBUMIN 4.6 11/13/2013 1247  ? ALBUMIN 4.5 05/05/2013 0807  ? AST 25 07/02/2021 1014  ? AST 32 11/13/2013 1247  ? AST 27 05/05/2013 0807  ? ALT 14 07/02/2021 1014  ? ALT 31 11/13/2013 1247  ? ALT 19 05/05/2013 0807  ? ALKPHOS 65 07/02/2021 1014  ?  ALKPHOS 58 11/13/2013 1247  ? ALKPHOS 79 05/05/2013 0807  ? BILITOT 1.0 07/02/2021 1014  ? BILITOT 0.7 11/13/2013 1247  ? BILITOT 0.64 05/05/2013 0807  ? GFRNONAA >60 07/02/2021 1014  ? GFRNONAA 53 (L) 09/2

## 2021-07-02 NOTE — Assessment & Plan Note (Addendum)
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  ? ?#Clinically no evidence of recurrence.   Continue as well.  Tolerating well anastrozole with mild to moderate side effects.  Okay to discontinue anastrozole at this time as she has been on for 10 years now.  Low clinical risk of recurrence. ? ?# Trigger Finger- Left Thumb-likely secondary to anastrozole.  Discontinue as well at this time. ? ?# Hypokalemia: 3.2-unclear etiology.  Patient not on diuretics.  Discussed dietary supplementation. ? 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  Stop Arimidex as above. ? ?#DEXA scan MAY 2022- Osteopenia T score -1.7-STABLE; ; continue Fosamax;  Continue the same for now.  Follow-up with PCP with a bone density again in 2024. ? ?# ZOX-096 systolic however at home better as per patient.  Continue close monitoring at home. ? ?#Since patient is clinically stable I think is reasonable for the patient to follow-up with PCP/can follow-up with [place] as needed.  Patient comfortable with the plan; to call us if any questions or concerns in the interim. ? ?# DISPOSITION:  ?# follow up as needed- Dr.B ?

## 2021-07-04 DIAGNOSIS — H04123 Dry eye syndrome of bilateral lacrimal glands: Secondary | ICD-10-CM | POA: Diagnosis not present

## 2021-07-04 DIAGNOSIS — H2513 Age-related nuclear cataract, bilateral: Secondary | ICD-10-CM | POA: Diagnosis not present

## 2021-07-04 DIAGNOSIS — H0102A Squamous blepharitis right eye, upper and lower eyelids: Secondary | ICD-10-CM | POA: Diagnosis not present

## 2021-07-04 DIAGNOSIS — H43811 Vitreous degeneration, right eye: Secondary | ICD-10-CM | POA: Diagnosis not present

## 2021-08-27 ENCOUNTER — Other Ambulatory Visit: Payer: Self-pay | Admitting: Family Medicine

## 2021-08-27 DIAGNOSIS — E7800 Pure hypercholesterolemia, unspecified: Secondary | ICD-10-CM

## 2021-08-28 ENCOUNTER — Encounter: Payer: Self-pay | Admitting: Family Medicine

## 2021-08-28 ENCOUNTER — Ambulatory Visit (INDEPENDENT_AMBULATORY_CARE_PROVIDER_SITE_OTHER): Payer: Medicare Other | Admitting: Family Medicine

## 2021-08-28 VITALS — BP 160/80 | HR 65 | Temp 98.0°F | Ht 60.0 in | Wt 126.6 lb

## 2021-08-28 DIAGNOSIS — E782 Mixed hyperlipidemia: Secondary | ICD-10-CM | POA: Diagnosis not present

## 2021-08-28 DIAGNOSIS — M65312 Trigger thumb, left thumb: Secondary | ICD-10-CM

## 2021-08-28 DIAGNOSIS — I10 Essential (primary) hypertension: Secondary | ICD-10-CM

## 2021-08-28 DIAGNOSIS — Q210 Ventricular septal defect: Secondary | ICD-10-CM

## 2021-08-28 DIAGNOSIS — E039 Hypothyroidism, unspecified: Secondary | ICD-10-CM | POA: Diagnosis not present

## 2021-08-28 LAB — COMPREHENSIVE METABOLIC PANEL
ALT: 12 U/L (ref 0–35)
AST: 24 U/L (ref 0–37)
Albumin: 4.8 g/dL (ref 3.5–5.2)
Alkaline Phosphatase: 61 U/L (ref 39–117)
BUN: 13 mg/dL (ref 6–23)
CO2: 27 mEq/L (ref 19–32)
Calcium: 10.2 mg/dL (ref 8.4–10.5)
Chloride: 104 mEq/L (ref 96–112)
Creatinine, Ser: 0.88 mg/dL (ref 0.40–1.20)
GFR: 62.6 mL/min (ref >60.00)
Glucose, Bld: 98 mg/dL (ref 70–99)
Potassium: 4.1 mEq/L (ref 3.5–5.1)
Sodium: 140 mEq/L (ref 135–145)
Total Bilirubin: 0.7 mg/dL (ref 0.2–1.2)
Total Protein: 7.6 g/dL (ref 6.0–8.3)

## 2021-08-28 LAB — CBC
HCT: 42 % (ref 36.0–46.0)
Hemoglobin: 13.9 g/dL (ref 12.0–15.0)
MCHC: 33.2 g/dL (ref 30.0–36.0)
MCV: 90.2 fl (ref 78.0–100.0)
Platelets: 286 10*3/uL (ref 150.0–400.0)
RBC: 4.66 Mil/uL (ref 3.87–5.11)
RDW: 13.2 % (ref 11.5–15.5)
WBC: 6.7 10*3/uL (ref 4.0–10.5)

## 2021-08-28 LAB — TSH: TSH: 0.12 u[IU]/mL — ABNORMAL LOW (ref 0.35–5.50)

## 2021-08-28 LAB — LIPID PANEL
Cholesterol: 200 mg/dL (ref 0–200)
HDL: 75.9 mg/dL (ref >39.00)
LDL Cholesterol: 101 mg/dL — ABNORMAL HIGH (ref 0–99)
NonHDL: 123.93
Total CHOL/HDL Ratio: 3
Triglycerides: 113 mg/dL (ref 0.0–149.0)
VLDL: 22.6 mg/dL (ref 0.0–40.0)

## 2021-08-28 MED ORDER — AMLODIPINE BESYLATE 10 MG PO TABS: 10.0000 mg | ORAL_TABLET | Freq: Every day | ORAL | 1 refills | Status: DC

## 2021-08-28 NOTE — Progress Notes (Signed)
Established Patient Office Visit  Subjective   Patient ID: Madison Huerta, female    DOB: [birth year]  Age: 81 y.o. MRN: [id number]  Chief Complaint  Patient presents with   Office Visit    Hypothyroid F/u Needs referral to breast center & cardiologist      HPI follow-up of hypertension, mixed hyperlipidemia, hypothyroidism and a problem with her left thumb.  Left thumb may get bent in the flexed position at times.  It can be uncomfortable.  Yearly mammograms through the breast center will be due in October.  She has been released by oncology regarding her history of breast cancer.  Arimidex has been discontinued.  Needs follow-up of VSD.    Review of Systems  Constitutional:  Negative for chills, diaphoresis, malaise/fatigue and weight loss.  HENT: Negative.    Eyes: Negative.  Negative for blurred vision and double vision.  Cardiovascular:  Negative for chest pain.  Gastrointestinal:  Negative for abdominal pain.  Genitourinary: Negative.   Musculoskeletal:  Negative for falls and myalgias.  Neurological:  Negative for speech change, loss of consciousness and weakness.  Psychiatric/Behavioral: Negative.        Objective:     BP (!) 160/80 (BP Location: Right Arm, Patient Position: Sitting, Cuff Size: Small)   Pulse 65   Temp 98 F (36.7 C) (Temporal)   Ht 5' (1.524 m)   Wt 126 lb 9.6 oz (57.4 kg)   SpO2 98%   BMI 24.72 kg/m    Physical Exam Constitutional:      General: She is not in acute distress.    Appearance: Normal appearance. She is not ill-appearing, toxic-appearing or diaphoretic.  HENT:     Head: Normocephalic and atraumatic.     Right Ear: External ear normal.     Left Ear: External ear normal.     Mouth/Throat:     Mouth: Mucous membranes are moist.     Pharynx: Oropharynx is clear. No oropharyngeal exudate or posterior oropharyngeal erythema.  Eyes:     General: No scleral icterus.       Right eye: No discharge.        Left eye: No discharge.      Extraocular Movements: Extraocular movements intact.     Conjunctiva/sclera: Conjunctivae normal.     Pupils: Pupils are equal, round, and reactive to light.  Cardiovascular:     Rate and Rhythm: Normal rate and regular rhythm.     Heart sounds: Murmur heard.  Pulmonary:     Effort: Pulmonary effort is normal. No respiratory distress.     Breath sounds: Normal breath sounds.  Abdominal:     General: Bowel sounds are normal.     Tenderness: There is no abdominal tenderness. There is no guarding.  Musculoskeletal:       Arms:     Cervical back: No rigidity or tenderness.  Skin:    General: Skin is warm and dry.  Neurological:     Mental Status: She is alert and oriented to person, place, and time.  Psychiatric:        Mood and Affect: Mood normal.        Behavior: Behavior normal.      No results found for any visits on 08/28/21.    The 10-year ASCVD risk score (Arnett DK, et al., 2019) is: 46.7%    Assessment & Plan:   Problem List Items Addressed This Visit       Cardiovascular and Mediastinum   Essential  hypertension - Primary   Relevant Medications   amLODipine (NORVASC) 10 MG tablet   Other Relevant Orders   CBC   Comprehensive metabolic panel   Ventricular septal defect   Relevant Medications   amLODipine (NORVASC) 10 MG tablet   Other Relevant Orders   Ambulatory referral to Cardiology     Endocrine   Hypothyroidism   Relevant Orders   TSH     Other   Hyperlipidemia   Relevant Medications   amLODipine (NORVASC) 10 MG tablet   Other Relevant Orders   Lipid panel    Return in about 6 weeks (around 10/09/2021).   Have increased amlodipine to 10 mg daily.  Rechecking TSH status post increasing levothyroxine.  Cardiology referral for follow-up of VSD with history of endocarditis.  Given information about trigger finger.  Could refer to sports medicine as needed. Libby Maw, MD

## 2021-08-29 ENCOUNTER — Telehealth: Payer: Self-pay | Admitting: Family Medicine

## 2021-08-29 MED ORDER — LEVOTHYROXINE SODIUM 50 MCG PO TABS: ORAL_TABLET | ORAL | 1 refills | Status: DC

## 2021-08-29 NOTE — Addendum Note (Signed)
Addended by: Jon Billings on: 08/29/2021 07:59 AM   Modules accepted: Orders

## 2021-08-29 NOTE — Telephone Encounter (Signed)
Pt advised of lab results.  

## 2021-08-29 NOTE — Telephone Encounter (Signed)
Pt called and stated that she missed your call about lab results so please give pt a call back

## 2021-08-31 ENCOUNTER — Other Ambulatory Visit: Payer: Self-pay | Admitting: Family Medicine

## 2021-08-31 ENCOUNTER — Other Ambulatory Visit: Payer: Self-pay | Admitting: Internal Medicine

## 2021-08-31 DIAGNOSIS — Z170 Estrogen receptor positive status [ER+]: Secondary | ICD-10-CM

## 2021-08-31 DIAGNOSIS — M8589 Other specified disorders of bone density and structure, multiple sites: Secondary | ICD-10-CM

## 2021-10-08 NOTE — Progress Notes (Signed)
Referring-William Krystal Clark, MD Reason for referral-ventricular septal defect  HPI: 81 year old female for evaluation of ventricular septal defect at request of Libby Maw, MD.  Previously followed by Dr. Rockey Situ but not seen since March 2018.  Patient has a history of small ventricular septal defect.  She also has a history of endocarditis of the aortic valve.  Last transesophageal echocardiogram October 2016 showed normal LV function, small mobile vegetation on the left coronary cusp, mild mitral regurgitation and small membranous VSD.  Patient denies dyspnea, chest pain, palpitations or syncope.  No pedal edema.  Cardiology now asked to evaluate.  Current Outpatient Medications  Medication Sig Dispense Refill   acetaminophen (TYLENOL) 500 MG tablet Take 500 mg by mouth every 4 (four) hours as needed for fever.     alendronate (FOSAMAX) 70 MG tablet Take with a full glass of water on an empty stomach. 12 tablet 1   amLODipine (NORVASC) 10 MG tablet Take 1 tablet (10 mg total) by mouth daily. 90 tablet 1   cholecalciferol (VITAMIN D) 1000 units tablet Take 1,000 Units by mouth daily.     levothyroxine (SYNTHROID) 50 MCG tablet Take one daily and 1 1/2 on Sunday. 96 tablet 1   rosuvastatin (CRESTOR) 10 MG tablet TAKE 1 TABLET BY MOUTH DAILY 90 tablet 1   No current facility-administered medications for this visit.    Allergies  Allergen Reactions   Codeine Other (See Comments)    Reaction:  Severe headaches    Penicillins Hives and Other (See Comments)    Has patient had a PCN reaction causing immediate rash, facial/tongue/throat swelling, SOB or lightheadedness with hypotension: No Has patient had a PCN reaction causing severe rash involving mucus membranes or skin necrosis: No Has patient had a PCN reaction that required hospitalization No Has patient had a PCN reaction occurring within the last 10 years: No If all of the above answers are "NO", then may proceed with  Cephalosporin use.   Adhesive [Tape] Rash     Past Medical History:  Diagnosis Date   Breast cancer ([place]) 1994   L breast- lumpectomy, care for at [place] [facility]) 2013   right breast- mastectomy, care at [facility]   Cataract    Endocarditis    GERD (gastroesophageal reflux disease)    Heart murmur    VSD- echocardiogram - [facility] 10 yrs. ago   Hyperlipidemia    Hypertension    Hypothyroidism    Osteopenia    Personal history of radiation therapy 1994   left breast ca   Thyroid disease    Hypothyroidism   VSD (ventricular septal defect)    does need SBE prophylaxis (Keflex  3 gm 1 hour pprior and 1.5 gm 6 hours post    Past Surgical History:  Procedure Laterality Date   2D echo  2004   Normal at [place] Bilateral    removed [place]   breast ca   BREAST BIOPSY Right 2013   breast ca   BREAST IMPLANT REMOVAL  1994   BREAST LUMPECTOMY Left 1994   lumpectomy and rad tx and tamoxifen   MASTECTOMY Right 01/08/2012   complete and anastrozole   MASTECTOMY W/ SENTINEL NODE BIOPSY  01/08/2012   Procedure: MASTECTOMY WITH SENTINEL LYMPH NODE BIOPSY;  Surgeon: Madilyn Hook, DO;  Location: [facility];  Service: General;  Laterality: Right;  right breast mastectomy with sentinel lymph node biopsy  PARATHYROIDECTOMY  12/2002   TONSILLECTOMY     As a child   TUBAL LIGATION     1976    Social History   Socioeconomic History   Marital status: Widowed    Spouse name: Not on file   Number of children: 2   Years of education: Not on file   Highest education level: Not on file  Occupational History   Occupation: Animator: UNEMPLOYED  Tobacco Use   Smoking status: Never   Smokeless tobacco: Never  Vaping Use   Vaping Use: Never used  Substance and Sexual Activity   Alcohol use: Yes    Alcohol/week: 3.0 standard drinks of alcohol    Types: 3 Glasses of wine per week   Drug use: No   Sexual activity:  Never  Other Topics Concern   Not on file  Social History Narrative   Not on file   Social Determinants of Health   Financial Resource Strain: Low Risk  (06/25/2021)   Overall Financial Resource Strain (CARDIA)    Difficulty of Paying Living Expenses: Not hard at all  Food Insecurity: No Food Insecurity (06/25/2021)   Hunger Vital Sign    Worried About Running Out of Food in the Last Year: Never true    [place] in the Last Year: Never true  Transportation Needs: No Transportation Needs (06/25/2021)   PRAPARE - Hydrologist (Medical): No    Lack of Transportation (Non-Medical): No  Physical Activity: Sufficiently Active (06/25/2021)   Exercise Vital Sign    Days of Exercise per Week: 5 days    Minutes of Exercise per Session: 30 min  Stress: No Stress Concern Present (06/25/2021)   [place]    Feeling of Stress : Not at all  Social Connections: Moderately Isolated (06/25/2021)   Social Connection and Isolation Panel [NHANES]    Frequency of Communication with Friends and Family: Twice a week    Frequency of Social Gatherings with Friends and Family: Twice a week    Attends Religious Services: Never    Marine scientist or Organizations: Yes    Attends Archivist Meetings: 1 to 4 times per year    Marital Status: Widowed  Intimate Partner Violence: Not At Risk (06/25/2021)   Humiliation, Afraid, Rape, and Kick questionnaire    Fear of Current or Ex-Partner: No    Emotionally Abused: No    Physically Abused: No    Sexually Abused: No    Family History  Problem Relation Age of Onset   Aneurysm Mother        brain   Cancer Father        Lung, Liver mets   Lung cancer Father 42   Heart disease Other    Cancer Cousin        Breast CA   Breast cancer Cousin 30   Breast cancer Maternal Aunt 55    ROS: no fevers or chills, productive cough, hemoptysis, dysphasia,  odynophagia, melena, hematochezia, dysuria, hematuria, rash, seizure activity, orthopnea, PND, pedal edema, claudication. Remaining systems are negative.  Physical Exam:   Blood pressure (!) 144/64, pulse 82, height '5\' 1"'$  (1.549 m), weight 126 lb 6.4 oz (57.3 kg), SpO2 97 %.  General:  Well developed/well nourished in NAD Skin warm/dry Patient not depressed No peripheral clubbing Back-normal HEENT-normal/normal eyelids Neck supple/normal carotid upstroke bilaterally; no bruits; no JVD;  no thyromegaly chest - CTA/ normal expansion CV - RRR/normal S1 and S2; no murmurs, rubs or gallops;  PMI nondisplaced Abdomen -NT/ND, no HSM, no mass, + bowel sounds, no bruit 2+ femoral pulses, no bruits Ext-no edema, chords, 2+ DP Neuro-grossly nonfocal  ECG -normal sinus rhythm at a rate of 79, normal axis, nonspecific ST changes.  Personally reviewed  A/P  1 small ventricular septal defect-we will plan to repeat echocardiogram.  Previous study showed normal right heart by report.  2 history of endocarditis-no recurrences. Needs SBE prophylaxis.  Card provided today.  3 hypertension-patient's blood pressure is mildly elevated; however typically controlled at home.  We will follow and advance medications as needed.  4 hyperlipidemia-continue statin.  Kirk Ruths, MD

## 2021-10-09 ENCOUNTER — Ambulatory Visit (INDEPENDENT_AMBULATORY_CARE_PROVIDER_SITE_OTHER): Payer: Medicare Other | Admitting: Family Medicine

## 2021-10-09 ENCOUNTER — Encounter: Payer: Self-pay | Admitting: Family Medicine

## 2021-10-09 VITALS — BP 128/66 | HR 70 | Temp 97.9°F | Ht 60.0 in | Wt 127.0 lb

## 2021-10-09 DIAGNOSIS — E782 Mixed hyperlipidemia: Secondary | ICD-10-CM

## 2021-10-09 DIAGNOSIS — E039 Hypothyroidism, unspecified: Secondary | ICD-10-CM

## 2021-10-09 DIAGNOSIS — M8589 Other specified disorders of bone density and structure, multiple sites: Secondary | ICD-10-CM

## 2021-10-09 DIAGNOSIS — C50311 Malignant neoplasm of lower-inner quadrant of right female breast: Secondary | ICD-10-CM | POA: Diagnosis not present

## 2021-10-09 DIAGNOSIS — Z170 Estrogen receptor positive status [ER+]: Secondary | ICD-10-CM | POA: Diagnosis not present

## 2021-10-09 DIAGNOSIS — Q210 Ventricular septal defect: Secondary | ICD-10-CM

## 2021-10-09 DIAGNOSIS — I10 Essential (primary) hypertension: Secondary | ICD-10-CM | POA: Diagnosis not present

## 2021-10-09 DIAGNOSIS — Z8679 Personal history of other diseases of the circulatory system: Secondary | ICD-10-CM

## 2021-10-09 LAB — TSH: TSH: 2.48 u[IU]/mL (ref 0.35–5.50)

## 2021-10-09 LAB — LDL CHOLESTEROL, DIRECT: Direct LDL: 91 mg/dL

## 2021-10-09 MED ORDER — ALENDRONATE SODIUM 70 MG PO TABS: ORAL_TABLET | ORAL | 1 refills | Status: DC

## 2021-10-09 NOTE — Progress Notes (Signed)
Established Patient Office Visit  Subjective   Patient ID: Madison Huerta, female    DOB: [birth year]  Age: 81 y.o. MRN: [id number]  Chief Complaint  Patient presents with   Follow-up    6 week follow up on BP,     HPI follow-up of hypertension, hypothyroidism mixed hyperlipidemia.  Blood pressure improved on higher dose of amlodipine.  Has adjusted levothyroxine dose as directed with 50 mg Monday through Saturday and 75 on Sunday.  Taking Crestor 10 mg daily.  Follow-up scheduled with cardiology for VSD and history of endocarditis.    Review of Systems  Constitutional:  Negative for chills, diaphoresis, malaise/fatigue and weight loss.  HENT: Negative.    Eyes: Negative.  Negative for blurred vision and double vision.  Respiratory: Negative.    Cardiovascular:  Negative for chest pain.  Gastrointestinal:  Negative for abdominal pain.  Genitourinary: Negative.   Musculoskeletal:  Negative for falls and myalgias.  Neurological:  Negative for speech change, loss of consciousness and weakness.  Psychiatric/Behavioral: Negative.        Objective:     BP 128/66 (BP Location: Left Arm, Patient Position: Sitting, Cuff Size: Normal)   Pulse 70   Temp 97.9 F (36.6 C) (Temporal)   Ht 5' (1.524 m)   Wt 127 lb (57.6 kg)   SpO2 99%   BMI 24.80 kg/m    Physical Exam Constitutional:      General: She is not in acute distress.    Appearance: Normal appearance. She is not ill-appearing, toxic-appearing or diaphoretic.  HENT:     Head: Normocephalic and atraumatic.     Right Ear: External ear normal.     Left Ear: External ear normal.  Eyes:     General: No scleral icterus.       Right eye: No discharge.        Left eye: No discharge.     Extraocular Movements: Extraocular movements intact.     Conjunctiva/sclera: Conjunctivae normal.  Cardiovascular:     Rate and Rhythm: Normal rate and regular rhythm.  Pulmonary:     Effort: Pulmonary effort is normal. No respiratory  distress.     Breath sounds: Normal breath sounds.  Musculoskeletal:     Cervical back: No rigidity or tenderness.  Skin:    General: Skin is warm and dry.  Neurological:     Mental Status: She is alert and oriented to person, place, and time.  Psychiatric:        Mood and Affect: Mood normal.        Behavior: Behavior normal.      No results found for any visits on 10/09/21.    The 10-year ASCVD risk score (Arnett DK, et al., 2019) is: 32.6%    Assessment & Plan:   Problem List Items Addressed This Visit       Cardiovascular and Mediastinum   Essential hypertension   Ventricular septal defect - Primary     Endocrine   Hypothyroidism   Relevant Orders   TSH     Musculoskeletal and Integument   Osteopenia   Relevant Medications   alendronate (FOSAMAX) 70 MG tablet     Other   History of endocarditis   Carcinoma of lower-inner quadrant of right breast in female, estrogen receptor positive (HCC)   Relevant Medications   alendronate (FOSAMAX) 70 MG tablet   RESOLVED: Hyperlipidemia   Relevant Orders   LDL cholesterol, direct    Return Has follow-up scheduled for  January..  Continue amlodipine at 10 mg.  Rechecking TSH and LDL cholesterol.  May increase Crestor to 20 mg.  Libby Maw, MD

## 2021-10-22 ENCOUNTER — Ambulatory Visit: Payer: Medicare Other | Attending: Cardiology | Admitting: Cardiology

## 2021-10-22 ENCOUNTER — Encounter: Payer: Self-pay | Admitting: Cardiology

## 2021-10-22 VITALS — BP 144/64 | HR 82 | Ht 61.0 in | Wt 126.4 lb

## 2021-10-22 DIAGNOSIS — Q210 Ventricular septal defect: Secondary | ICD-10-CM | POA: Diagnosis not present

## 2021-10-22 DIAGNOSIS — E7800 Pure hypercholesterolemia, unspecified: Secondary | ICD-10-CM | POA: Diagnosis not present

## 2021-10-22 DIAGNOSIS — I10 Essential (primary) hypertension: Secondary | ICD-10-CM

## 2021-10-22 NOTE — Patient Instructions (Signed)
  Testing/Procedures:  Your physician has requested that you have an echocardiogram. Echocardiography is a painless test that uses sound waves to create images of your heart. It provides your doctor with information about the size and shape of your heart and how well your heart's chambers and valves are working. This procedure takes approximately one hour. There are no restrictions for this procedure. [facility]-1ST FLOOR IMAGING DEPARTMENT   Follow-Up: At [facility], you and your health needs are our priority.  As part of our continuing mission to provide you with exceptional heart care, we have created designated Provider Care Teams.  These Care Teams include your primary Cardiologist (physician) and Advanced Practice Providers (APPs -  Physician Assistants and Nurse Practitioners) who all work together to provide you with the care you need, when you need it.  We recommend signing up for the patient portal called "MyChart".  Sign up information is provided on this After Visit Summary.  MyChart is used to connect with patients for Virtual Visits (Telemedicine).  Patients are able to view lab/test results, encounter notes, upcoming appointments, etc.  Non-urgent messages can be sent to your provider as well.   To learn more about what you can do with MyChart, go to [email or web address].    Your next appointment:   12 month(s)  The format for your next appointment:   In Person  Provider:   Kirk Ruths, MD

## 2021-10-31 ENCOUNTER — Ambulatory Visit (HOSPITAL_BASED_OUTPATIENT_CLINIC_OR_DEPARTMENT_OTHER)
Admission: RE | Admit: 2021-10-31 | Discharge: 2021-10-31 | Disposition: A | Discharge status: Home / Self Care | Payer: Medicare Other | Source: Ambulatory Visit | Attending: Cardiology | Admitting: Cardiology

## 2021-10-31 DIAGNOSIS — Q210 Ventricular septal defect: Secondary | ICD-10-CM | POA: Insufficient documentation

## 2021-10-31 LAB — ECHOCARDIOGRAM COMPLETE
AR max vel: 1.6 cm2
AV Area VTI: 1.45 cm2
AV Area mean vel: 1.61 cm2
AV Mean grad: 7 mmHg
AV Peak grad: 14 mmHg
Ao pk vel: 1.87 m/s
Area-P 1/2: 2.76 cm2
S' Lateral: 2.8 cm

## 2021-11-17 ENCOUNTER — Other Ambulatory Visit: Payer: Self-pay | Admitting: Family Medicine

## 2021-11-17 DIAGNOSIS — E039 Hypothyroidism, unspecified: Secondary | ICD-10-CM

## 2021-11-17 DIAGNOSIS — E7800 Pure hypercholesterolemia, unspecified: Secondary | ICD-10-CM

## 2021-11-21 ENCOUNTER — Ambulatory Visit (INDEPENDENT_AMBULATORY_CARE_PROVIDER_SITE_OTHER): Payer: Medicare Other

## 2021-11-21 DIAGNOSIS — Z23 Encounter for immunization: Secondary | ICD-10-CM

## 2021-11-21 NOTE — Progress Notes (Signed)
After obtaining consent, and per orders of Dr. Ethelene Hal, injection of Influenza Vaccine given by Augustina Mood. Patient pt tolerated injection well  and to report any adverse reaction to me immediately.

## 2022-01-05 ENCOUNTER — Encounter: Payer: Self-pay | Admitting: Family Medicine

## 2022-01-09 ENCOUNTER — Other Ambulatory Visit: Payer: Self-pay | Admitting: Family Medicine

## 2022-01-09 DIAGNOSIS — Z1231 Encounter for screening mammogram for malignant neoplasm of breast: Secondary | ICD-10-CM

## 2022-02-25 ENCOUNTER — Other Ambulatory Visit: Payer: Self-pay | Admitting: Family Medicine

## 2022-02-25 DIAGNOSIS — I10 Essential (primary) hypertension: Secondary | ICD-10-CM

## 2022-03-05 ENCOUNTER — Telehealth: Payer: Self-pay

## 2022-03-05 NOTE — Patient Instructions (Signed)
Visit Information  Thank you for taking time to visit with me today. Please don't hesitate to contact me if I can be of assistance to you.   Following are the goals we discussed today:   Goals Addressed             This Visit's Progress    COMPLETED: Care Coordinatrion Activities-No follow required       Care Coordination Interventions: Advised patient to Annual Wellness exam. Discussed [facility] services and support. Assessed SDOH. Advised to discuss with primary care physician if services needed in the future.         If you are experiencing a Mental Health or [place] or need someone to talk to, please call the Suicide and Crisis Lifeline: 988   Patient verbalizes understanding of instructions and care plan provided today and agrees to view in [place]. Active MyChart status and patient understanding of how to access instructions and care plan via MyChart confirmed with patient.     No further follow up required: decline  Jone Baseman, RN, MSN [place] [facility] Care Management Coordinator Direct Line [phone]

## 2022-03-05 NOTE — Patient Outreach (Signed)
  Care Coordination   Initial Visit Note   03/05/2022 Name: Madison Huerta MRN: [id number] DOB: [birth year]  Madison Huerta is a 82 y.o. year old female who sees Libby Maw, MD for primary care. I spoke with  Lorre Nick by phone today.  What matters to the patients health and wellness today?  none    Goals Addressed             This Visit's Progress    COMPLETED: Care Coordinatrion Activities-No follow required       Care Coordination Interventions: Advised patient to Annual Wellness exam. Discussed [facility] services and support. Assessed SDOH. Advised to discuss with primary care physician if services needed in the future.        SDOH assessments and interventions completed:  Yes  SDOH Interventions Today    Flowsheet Row Most Recent Value  SDOH Interventions   Housing Interventions Intervention Not Indicated  Utilities Interventions Intervention Not Indicated        Care Coordination Interventions:  Yes, provided   Follow up plan: No further intervention required.   Encounter Outcome:  Pt. Visit Completed   Jone Baseman, RN, MSN [place] [facility] Care Management Coordinator Direct Line [phone]

## 2022-03-12 ENCOUNTER — Ambulatory Visit
Admission: RE | Admit: 2022-03-12 | Discharge: 2022-03-12 | Disposition: A | Discharge status: Home / Self Care | Payer: Medicare Other | Source: Ambulatory Visit | Attending: Family Medicine | Admitting: Family Medicine

## 2022-03-12 DIAGNOSIS — Z1231 Encounter for screening mammogram for malignant neoplasm of breast: Secondary | ICD-10-CM | POA: Insufficient documentation

## 2022-03-30 ENCOUNTER — Ambulatory Visit (INDEPENDENT_AMBULATORY_CARE_PROVIDER_SITE_OTHER): Payer: Medicare Other | Admitting: Family Medicine

## 2022-03-30 ENCOUNTER — Encounter: Payer: Self-pay | Admitting: Family Medicine

## 2022-03-30 VITALS — BP 140/62 | HR 81 | Temp 97.4°F | Ht 61.0 in | Wt 127.4 lb

## 2022-03-30 DIAGNOSIS — M8589 Other specified disorders of bone density and structure, multiple sites: Secondary | ICD-10-CM

## 2022-03-30 DIAGNOSIS — E782 Mixed hyperlipidemia: Secondary | ICD-10-CM | POA: Diagnosis not present

## 2022-03-30 DIAGNOSIS — E039 Hypothyroidism, unspecified: Secondary | ICD-10-CM

## 2022-03-30 DIAGNOSIS — I10 Essential (primary) hypertension: Secondary | ICD-10-CM

## 2022-03-30 LAB — BASIC METABOLIC PANEL
BUN: 11 mg/dL (ref 6–23)
CO2: 25 mEq/L (ref 19–32)
Calcium: 9.8 mg/dL (ref 8.4–10.5)
Chloride: 102 mEq/L (ref 96–112)
Creatinine, Ser: 1.12 mg/dL (ref 0.40–1.20)
GFR: 46.68 mL/min — ABNORMAL LOW (ref >60.00)
Glucose, Bld: 108 mg/dL — ABNORMAL HIGH (ref 70–99)
Potassium: 3.8 mEq/L (ref 3.5–5.1)
Sodium: 138 mEq/L (ref 135–145)

## 2022-03-30 LAB — LIPID PANEL
Cholesterol: 205 mg/dL — ABNORMAL HIGH (ref 0–200)
HDL: 87.5 mg/dL (ref >39.00)
LDL Cholesterol: 90 mg/dL (ref 0–99)
NonHDL: 117.61
Total CHOL/HDL Ratio: 2
Triglycerides: 139 mg/dL (ref 0.0–149.0)
VLDL: 27.8 mg/dL (ref 0.0–40.0)

## 2022-03-30 LAB — CBC
HCT: 43.7 % (ref 36.0–46.0)
Hemoglobin: 14.8 g/dL (ref 12.0–15.0)
MCHC: 33.9 g/dL (ref 30.0–36.0)
MCV: 92.3 fl (ref 78.0–100.0)
Platelets: 311 10*3/uL (ref 150.0–400.0)
RBC: 4.74 Mil/uL (ref 3.87–5.11)
RDW: 13.2 % (ref 11.5–15.5)
WBC: 6.9 10*3/uL (ref 4.0–10.5)

## 2022-03-30 LAB — TSH: TSH: 10.32 u[IU]/mL — ABNORMAL HIGH (ref 0.35–5.50)

## 2022-03-30 NOTE — Progress Notes (Signed)
Established Patient Office Visit   Subjective:  Patient ID: Madison Huerta, female    DOB: [birth year]  Age: 82 y.o. MRN: [id number]  Chief Complaint  Patient presents with   Annual Exam    Fasting Labs, No concerns    HPI Encounter Diagnoses  Name Primary?   Mixed hyperlipidemia Yes   Hypothyroidism, unspecified type    Osteopenia of multiple sites    White coat syndrome with diagnosis of hypertension    For follow-up of above.  Has taken alendronate over 5 years.  Continues with atorvastatin 10 mg blood pressure.  Blood pressures at home and been running.  Is been monitored with annual checking.  Minutes prior daily.  Status post recent allergist evaluation   Review of Systems  Constitutional: Negative.   HENT: Negative.    Eyes:  Negative for blurred vision, discharge and redness.  Respiratory: Negative.    Cardiovascular: Negative.   Gastrointestinal:  Negative for abdominal pain.  Genitourinary: Negative.   Musculoskeletal: Negative.  Negative for myalgias.  Skin:  Negative for rash.  Neurological:  Negative for tingling, loss of consciousness and weakness.  Endo/Heme/Allergies:  Negative for polydipsia.     Current Outpatient Medications:    acetaminophen (TYLENOL) 500 MG tablet, Take 500 mg by mouth every 4 (four) hours as needed for fever., Disp: , Rfl:    amLODipine (NORVASC) 10 MG tablet, TAKE 1 TABLET(10 MG) BY MOUTH DAILY, Disp: 90 tablet, Rfl: 1   cholecalciferol (VITAMIN D) 1000 units tablet, Take 1,000 Units by mouth daily., Disp: , Rfl:    rosuvastatin (CRESTOR) 10 MG tablet, TAKE 1 TABLET BY MOUTH DAILY, Disp: 90 tablet, Rfl: 1   levothyroxine (SYNTHROID) 75 MCG tablet, Take 1 tablet (75 mcg total) by mouth daily before breakfast., Disp: 90 tablet, Rfl: 0   Objective:     BP (!) 140/62   Pulse 81   Temp (!) 97.4 F (36.3 C) (Temporal)   Ht '5\' 1"'$  (1.549 m)   Wt 127 lb 6.4 oz (57.8 kg)   SpO2 99%   BMI 24.07 kg/m    Physical  Exam Constitutional:      General: She is not in acute distress.    Appearance: Normal appearance. She is not ill-appearing, toxic-appearing or diaphoretic.  HENT:     Head: Normocephalic and atraumatic.     Right Ear: External ear normal.     Left Ear: External ear normal.     Mouth/Throat:     Mouth: Mucous membranes are moist.     Pharynx: Oropharynx is clear. No oropharyngeal exudate or posterior oropharyngeal erythema.  Eyes:     General: No scleral icterus.       Right eye: No discharge.        Left eye: No discharge.     Extraocular Movements: Extraocular movements intact.     Conjunctiva/sclera: Conjunctivae normal.     Pupils: Pupils are equal, round, and reactive to light.  Cardiovascular:     Rate and Rhythm: Normal rate and regular rhythm.  Pulmonary:     Effort: Pulmonary effort is normal. No respiratory distress.     Breath sounds: Normal breath sounds.  Musculoskeletal:     Cervical back: No rigidity or tenderness.  Skin:    General: Skin is warm and dry.  Neurological:     Mental Status: She is alert and oriented to person, place, and time.  Psychiatric:        Mood and Affect: Mood normal.  Behavior: Behavior normal.      Results for orders placed or performed in visit on 44/92/01  Basic metabolic panel  Result Value Ref Range   Sodium 138 135 - 145 mEq/L   Potassium 3.8 3.5 - 5.1 mEq/L   Chloride 102 96 - 112 mEq/L   CO2 25 19 - 32 mEq/L   Glucose, Bld 108 (H) 70 - 99 mg/dL   BUN 11 6 - 23 mg/dL   Creatinine, Ser 1.12 0.40 - 1.20 mg/dL   GFR 46.68 (L) >60.00 mL/min   Calcium 9.8 8.4 - 10.5 mg/dL  CBC  Result Value Ref Range   WBC 6.9 4.0 - 10.5 K/uL   RBC 4.74 3.87 - 5.11 Mil/uL   Platelets 311.0 150.0 - 400.0 K/uL   Hemoglobin 14.8 12.0 - 15.0 g/dL   HCT 43.7 36.0 - 46.0 %   MCV 92.3 78.0 - 100.0 fl   MCHC 33.9 30.0 - 36.0 g/dL   RDW 13.2 11.5 - 15.5 %  Lipid panel  Result Value Ref Range   Cholesterol 205 (H) 0 - 200 mg/dL    Triglycerides 139.0 0.0 - 149.0 mg/dL   HDL 87.50 >39.00 mg/dL   VLDL 27.8 0.0 - 40.0 mg/dL   LDL Cholesterol 90 0 - 99 mg/dL   Total CHOL/HDL Ratio 2    NonHDL 117.61   TSH  Result Value Ref Range   TSH 10.32 (H) 0.35 - 5.50 uIU/mL      The 10-year ASCVD risk score (Arnett DK, et al., 2019) is: 38.4%    Assessment & Plan:   Mixed hyperlipidemia -     Lipid panel  Hypothyroidism, unspecified type -     TSH -     Levothyroxine Sodium; Take 1 tablet (75 mcg total) by mouth daily before breakfast.  Dispense: 90 tablet; Refill: 0  Osteopenia of multiple sites  White coat syndrome with diagnosis of hypertension -     Basic metabolic panel -     CBC    Return in about 8 weeks (around 05/25/2022).  Completed course of alendronate.  [place] hypertension established.  Continue medicines as directed.  Will adjust pending results of today's labs.  Libby Maw, MD  2/7 addendum: TSH significantly elevated on 50 mcg of levothyroxine.  Patient reports strict compliance.  Have increased levothyroxine to 75 mcg daily.  Please follow-up in 8 weeks.

## 2022-04-01 ENCOUNTER — Telehealth: Payer: Self-pay | Admitting: Family Medicine

## 2022-04-01 MED ORDER — LEVOTHYROXINE SODIUM 75 MCG PO TABS: 75.0000 ug | ORAL_TABLET | Freq: Every day | ORAL | 0 refills | Status: DC

## 2022-04-01 NOTE — Telephone Encounter (Signed)
Patient called back and said she had received the mychart message from provider but wasn't able to respond through mychart. She said she has been taking the levothyroxine everyday

## 2022-04-01 NOTE — Addendum Note (Signed)
Addended by: Jon Billings on: 04/01/2022 10:02 AM   Modules accepted: Orders

## 2022-04-01 NOTE — Telephone Encounter (Signed)
Patient verbalized understanding. Appointment scheduled for follow up

## 2022-04-01 NOTE — Telephone Encounter (Signed)
Patient calling back in regards to lab response on significantly increased TSH levels states that she's been taking thyroid medication daily. Please advise.

## 2022-06-01 ENCOUNTER — Ambulatory Visit (INDEPENDENT_AMBULATORY_CARE_PROVIDER_SITE_OTHER): Payer: Medicare Other | Admitting: Family Medicine

## 2022-06-01 ENCOUNTER — Encounter: Payer: Self-pay | Admitting: Family Medicine

## 2022-06-01 VITALS — BP 136/68 | HR 74 | Temp 98.0°F | Ht 61.0 in | Wt 127.8 lb

## 2022-06-01 DIAGNOSIS — R7309 Other abnormal glucose: Secondary | ICD-10-CM | POA: Diagnosis not present

## 2022-06-01 DIAGNOSIS — N289 Disorder of kidney and ureter, unspecified: Secondary | ICD-10-CM | POA: Insufficient documentation

## 2022-06-01 DIAGNOSIS — E039 Hypothyroidism, unspecified: Secondary | ICD-10-CM

## 2022-06-01 DIAGNOSIS — E7800 Pure hypercholesterolemia, unspecified: Secondary | ICD-10-CM

## 2022-06-01 DIAGNOSIS — I10 Essential (primary) hypertension: Secondary | ICD-10-CM | POA: Diagnosis not present

## 2022-06-01 LAB — BASIC METABOLIC PANEL
BUN: 17 mg/dL (ref 6–23)
CO2: 24 mEq/L (ref 19–32)
Calcium: 10 mg/dL (ref 8.4–10.5)
Chloride: 105 mEq/L (ref 96–112)
Creatinine, Ser: 0.93 mg/dL (ref 0.40–1.20)
GFR: 58.27 mL/min — ABNORMAL LOW (ref >60.00)
Glucose, Bld: 91 mg/dL (ref 70–99)
Potassium: 4 mEq/L (ref 3.5–5.1)
Sodium: 141 mEq/L (ref 135–145)

## 2022-06-01 LAB — TSH: TSH: 0.23 u[IU]/mL — ABNORMAL LOW (ref 0.35–5.50)

## 2022-06-01 LAB — HEMOGLOBIN A1C: Hgb A1c MFr Bld: 5.6 % (ref 4.6–6.5)

## 2022-06-01 MED ORDER — AMLODIPINE BESYLATE 10 MG PO TABS: ORAL_TABLET | ORAL | 1 refills | Status: DC

## 2022-06-01 MED ORDER — ROSUVASTATIN CALCIUM 10 MG PO TABS: 10.0000 mg | ORAL_TABLET | Freq: Every day | ORAL | 1 refills | Status: DC

## 2022-06-01 NOTE — Progress Notes (Signed)
Established Patient Office Visit   Subjective:  Patient ID: Madison Huerta, female    DOB: [birth year]  Age: 82 y.o. MRN: [id number]  Chief Complaint  Patient presents with   Thyroid Problem    Follow up on TS, no concerns. Patient fasting.     Thyroid Problem   Encounter Diagnoses  Name Primary?   Hypothyroidism, unspecified type Yes   Pure hypercholesterolemia    Essential hypertension    Renal insufficiency    Elevated glucose    Follow-up of above.  Feeling better on the higher dose levothyroxine.  No palpitations.  Blood pressure well-controlled with amlodipine.  No swelling in her lower extremities.  Continues rosuvastatin.  She is exercising daily for 30 minutes by walking.   Review of Systems  Constitutional: Negative.   HENT: Negative.    Eyes:  Negative for blurred vision, discharge and redness.  Respiratory: Negative.    Cardiovascular: Negative.   Gastrointestinal:  Negative for abdominal pain.  Genitourinary: Negative.   Musculoskeletal: Negative.  Negative for myalgias.  Skin:  Negative for rash.  Neurological:  Negative for tingling, loss of consciousness and weakness.  Endo/Heme/Allergies:  Negative for polydipsia.     Current Outpatient Medications:    acetaminophen (TYLENOL) 500 MG tablet, Take 500 mg by mouth every 4 (four) hours as needed for fever., Disp: , Rfl:    cholecalciferol (VITAMIN D) 1000 units tablet, Take 1,000 Units by mouth daily., Disp: , Rfl:    levothyroxine (SYNTHROID) 75 MCG tablet, Take 1 tablet (75 mcg total) by mouth daily before breakfast., Disp: 90 tablet, Rfl: 0   amLODipine (NORVASC) 10 MG tablet, TAKE 1 TABLET(10 MG) BY MOUTH DAILY, Disp: 90 tablet, Rfl: 1   rosuvastatin (CRESTOR) 10 MG tablet, Take 1 tablet (10 mg total) by mouth daily., Disp: 90 tablet, Rfl: 1   Objective:     BP 136/68 (BP Location: Left Arm, Patient Position: Sitting, Cuff Size: Normal)   Pulse 74   Temp 98 F (36.7 C) (Temporal)   Ht 5\' 1"   (1.549 m)   Wt 127 lb 12.8 oz (58 kg)   SpO2 99%   BMI 24.15 kg/m    Physical Exam Constitutional:      General: She is not in acute distress.    Appearance: Normal appearance. She is not ill-appearing, toxic-appearing or diaphoretic.  HENT:     Head: Normocephalic and atraumatic.     Right Ear: External ear normal.     Left Ear: External ear normal.     Mouth/Throat:     Mouth: Mucous membranes are moist.     Pharynx: Oropharynx is clear. No oropharyngeal exudate or posterior oropharyngeal erythema.  Eyes:     General: No scleral icterus.       Right eye: No discharge.        Left eye: No discharge.     Extraocular Movements: Extraocular movements intact.     Conjunctiva/sclera: Conjunctivae normal.     Pupils: Pupils are equal, round, and reactive to light.  Cardiovascular:     Rate and Rhythm: Normal rate and regular rhythm.     Heart sounds: Murmur heard.  Pulmonary:     Effort: Pulmonary effort is normal. No respiratory distress.     Breath sounds: Normal breath sounds. No wheezing or rales.  Abdominal:     General: Bowel sounds are normal.     Tenderness: There is no abdominal tenderness. There is no guarding.  Musculoskeletal:  Cervical back: No rigidity or tenderness.     Right lower leg: No edema.     Left lower leg: No edema.  Skin:    General: Skin is warm and dry.  Neurological:     Mental Status: She is alert and oriented to person, place, and time.  Psychiatric:        Mood and Affect: Mood normal.        Behavior: Behavior normal.      No results found for any visits on 06/01/22.    The 10-year ASCVD risk score (Arnett DK, et al., 2019) is: 36.7%    Assessment & Plan:   Hypothyroidism, unspecified type -     TSH  Pure hypercholesterolemia -     Rosuvastatin Calcium; Take 1 tablet (10 mg total) by mouth daily.  Dispense: 90 tablet; Refill: 1  Essential hypertension -     amLODIPine Besylate; TAKE 1 TABLET(10 MG) BY MOUTH DAILY   Dispense: 90 tablet; Refill: 1 -     Microalbumin / creatinine urine ratio -     Basic metabolic panel  Renal insufficiency -     Microalbumin / creatinine urine ratio -     Basic metabolic panel  Elevated glucose -     Basic metabolic panel -     Hemoglobin A1c    Return in about 3 months (around 08/31/2022).    Mliss Sax, MD

## 2022-06-02 LAB — MICROALBUMIN / CREATININE URINE RATIO
Creatinine,U: 133.8 mg/dL
Microalb Creat Ratio: 3.1 mg/g (ref 0.0–30.0)
Microalb, Ur: 4.2 mg/dL — ABNORMAL HIGH (ref 0.0–1.9)

## 2022-06-02 MED ORDER — LEVOTHYROXINE SODIUM 125 MCG PO TABS
62.5000 ug | ORAL_TABLET | Freq: Every day | ORAL | 1 refills | Status: DC
Start: 2022-06-02 — End: 2022-10-06

## 2022-06-02 NOTE — Addendum Note (Signed)
Addended by: Andrez Grime on: 06/02/2022 12:23 PM   Modules accepted: Orders

## 2022-06-03 ENCOUNTER — Telehealth: Payer: Self-pay | Admitting: Family Medicine

## 2022-06-03 DIAGNOSIS — R350 Frequency of micturition: Secondary | ICD-10-CM

## 2022-06-03 NOTE — Telephone Encounter (Addendum)
Pt called stating she is having difficulty urinating. Mild low back pain. Pt has AZO at home and did a home test that showed + UTI. She said she brought in a urine sample Monday afternoon but not sure if we can check for that. Pt asking for antibiotic for a UTI.  [id number] [place], [place] - [address] AT [facility] OF [address] Phone: [phone]  Fax: [phone]     Call back [phone]

## 2022-06-03 NOTE — Telephone Encounter (Signed)
error 

## 2022-06-03 NOTE — Telephone Encounter (Signed)
Please advise message below labs does not show a run was tested at office will patient need to come back for U/A or can something be sent in?

## 2022-06-04 NOTE — Addendum Note (Signed)
Addended by: Andrez Grime on: 06/04/2022 07:56 AM   Modules accepted: Orders

## 2022-06-04 NOTE — Telephone Encounter (Signed)
Patient agrees to stop AZO appointment scheduled for U/A and culture

## 2022-06-05 ENCOUNTER — Other Ambulatory Visit (INDEPENDENT_AMBULATORY_CARE_PROVIDER_SITE_OTHER): Payer: Medicare Other

## 2022-06-05 DIAGNOSIS — R350 Frequency of micturition: Secondary | ICD-10-CM

## 2022-06-05 LAB — URINALYSIS, ROUTINE W REFLEX MICROSCOPIC
Bilirubin Urine: NEGATIVE
Ketones, ur: NEGATIVE
Nitrite: NEGATIVE
Specific Gravity, Urine: 1.01 (ref 1.000–1.030)
Total Protein, Urine: NEGATIVE
Urine Glucose: NEGATIVE
Urobilinogen, UA: 0.2 (ref 0.0–1.0)
pH: 7.5 (ref 5.0–8.0)

## 2022-06-06 LAB — URINE CULTURE
MICRO NUMBER:: 14818206
SPECIMEN QUALITY:: ADEQUATE

## 2022-06-30 ENCOUNTER — Telehealth: Payer: Self-pay | Admitting: Family Medicine

## 2022-06-30 NOTE — Telephone Encounter (Signed)
Contacted Madison Huerta to schedule their annual wellness visit. Appointment made for 07/06/22.  Madison Huerta AWV direct phone # [phone]

## 2022-07-06 ENCOUNTER — Ambulatory Visit (INDEPENDENT_AMBULATORY_CARE_PROVIDER_SITE_OTHER): Payer: Medicare Other

## 2022-07-06 VITALS — Ht 60.0 in | Wt 127.0 lb

## 2022-07-06 DIAGNOSIS — Z0000 Encounter for general adult medical examination without abnormal findings: Secondary | ICD-10-CM | POA: Diagnosis not present

## 2022-07-06 NOTE — Patient Instructions (Signed)
Madison Huerta , Thank you for taking time to come for your Medicare Wellness Visit. I appreciate your ongoing commitment to your health goals. Please review the following plan we discussed and let me know if I can assist you in the future.   These are the goals we discussed:  Goals      Increase physical activity     Weather permitting, I will continue to walk for 30 minutes daily.      Patient Stated     Drinking more water     Patient Stated     07/06/2022, no goals        This is a list of the screening recommended for you and due dates:  Health Maintenance  Topic Date Due   COVID-19 Vaccine (5 - 2023-24 season) 06/17/2023*   Pap Smear  03/04/2027*   Flu Shot  09/24/2022   Mammogram  03/13/2023   Medicare Annual Wellness Visit  07/06/2023   DTaP/Tdap/Td vaccine (4 - Td or Tdap) 03/01/2031   Pneumonia Vaccine  Completed   DEXA scan (bone density measurement)  Completed   Zoster (Shingles) Vaccine  Completed   HPV Vaccine  Aged Out  *Topic was postponed. The date shown is not the original due date.    Advanced directives: Please bring a copy of your POA (Power of Attorney) and/or Living Will to your next appointment.   Conditions/risks identified: none  Next appointment: Follow up in one year for your annual wellness visit    Preventive Care 65 Years and Older, Female Preventive care refers to lifestyle choices and visits with your health care provider that can promote health and wellness. What does preventive care include? A yearly physical exam. This is also called an annual well check. Dental exams once or twice a year. Routine eye exams. Ask your health care provider how often you should have your eyes checked. Personal lifestyle choices, including: Daily care of your teeth and gums. Regular physical activity. Eating a healthy diet. Avoiding tobacco and drug use. Limiting alcohol use. Practicing safe sex. Taking low-dose aspirin every day. Taking vitamin and  mineral supplements as recommended by your health care provider. What happens during an annual well check? The services and screenings done by your health care provider during your annual well check will depend on your age, overall health, lifestyle risk factors, and family history of disease. Counseling  Your health care provider may ask you questions about your: Alcohol use. Tobacco use. Drug use. Emotional well-being. Home and relationship well-being. Sexual activity. Eating habits. History of falls. Memory and ability to understand (cognition). Work and work Astronomer. Reproductive health. Screening  You may have the following tests or measurements: Height, weight, and BMI. Blood pressure. Lipid and cholesterol levels. These may be checked every 5 years, or more frequently if you are over 77 years old. Skin check. Lung cancer screening. You may have this screening every year starting at age 43 if you have a 30-pack-year history of smoking and currently smoke or have quit within the past 15 years. Fecal occult blood test (FOBT) of the stool. You may have this test every year starting at age 52. Flexible sigmoidoscopy or colonoscopy. You may have a sigmoidoscopy every 5 years or a colonoscopy every 10 years starting at age 32. Hepatitis C blood test. Hepatitis B blood test. Sexually transmitted disease (STD) testing. Diabetes screening. This is done by checking your blood sugar (glucose) after you have not eaten for a while (fasting). You may  have this done every 1-3 years. Bone density scan. This is done to screen for osteoporosis. You may have this done starting at age 54. Mammogram. This may be done every 1-2 years. Talk to your health care provider about how often you should have regular mammograms. Talk with your health care provider about your test results, treatment options, and if necessary, the need for more tests. Vaccines  Your health care provider may recommend  certain vaccines, such as: Influenza vaccine. This is recommended every year. Tetanus, diphtheria, and acellular pertussis (Tdap, Td) vaccine. You may need a Td booster every 10 years. Zoster vaccine. You may need this after age 58. Pneumococcal 13-valent conjugate (PCV13) vaccine. One dose is recommended after age 41. Pneumococcal polysaccharide (PPSV23) vaccine. One dose is recommended after age 63. Talk to your health care provider about which screenings and vaccines you need and how often you need them. This information is not intended to replace advice given to you by your health care provider. Make sure you discuss any questions you have with your health care provider. Document Released: 03/08/2015 Document Revised: 10/30/2015 Document Reviewed: 12/11/2014 Elsevier Interactive Patient Education  2017 [place] Prevention in the Home Falls can cause injuries. They can happen to people of all ages. There are many things you can do to make your home safe and to help prevent falls. What can I do on the outside of my home? Regularly fix the edges of walkways and driveways and fix any cracks. Remove anything that might make you trip as you walk through a door, such as a raised step or threshold. Trim any bushes or trees on the path to your home. Use bright outdoor lighting. Clear any walking paths of anything that might make someone trip, such as rocks or tools. Regularly check to see if handrails are loose or broken. Make sure that both sides of any steps have handrails. Any raised decks and porches should have guardrails on the edges. Have any leaves, snow, or ice cleared regularly. Use sand or salt on walking paths during winter. Clean up any spills in your garage right away. This includes oil or grease spills. What can I do in the bathroom? Use night lights. Install grab bars by the toilet and in the tub and shower. Do not use towel bars as grab bars. Use non-skid mats or  decals in the tub or shower. If you need to sit down in the shower, use a plastic, non-slip stool. Keep the floor dry. Clean up any water that spills on the floor as soon as it happens. Remove soap buildup in the tub or shower regularly. Attach bath mats securely with double-sided non-slip rug tape. Do not have throw rugs and other things on the floor that can make you trip. What can I do in the bedroom? Use night lights. Make sure that you have a light by your bed that is easy to reach. Do not use any sheets or blankets that are too big for your bed. They should not hang down onto the floor. Have a firm chair that has side arms. You can use this for support while you get dressed. Do not have throw rugs and other things on the floor that can make you trip. What can I do in the kitchen? Clean up any spills right away. Avoid walking on wet floors. Keep items that you use a lot in easy-to-reach places. If you need to reach something above you, use a strong step  stool that has a grab bar. Keep electrical cords out of the way. Do not use floor polish or wax that makes floors slippery. If you must use wax, use non-skid floor wax. Do not have throw rugs and other things on the floor that can make you trip. What can I do with my stairs? Do not leave any items on the stairs. Make sure that there are handrails on both sides of the stairs and use them. Fix handrails that are broken or loose. Make sure that handrails are as long as the stairways. Check any carpeting to make sure that it is firmly attached to the stairs. Fix any carpet that is loose or worn. Avoid having throw rugs at the top or bottom of the stairs. If you do have throw rugs, attach them to the floor with carpet tape. Make sure that you have a light switch at the top of the stairs and the bottom of the stairs. If you do not have them, ask someone to add them for you. What else can I do to help prevent falls? Wear shoes that: Do not  have high heels. Have rubber bottoms. Are comfortable and fit you well. Are closed at the toe. Do not wear sandals. If you use a stepladder: Make sure that it is fully opened. Do not climb a closed stepladder. Make sure that both sides of the stepladder are locked into place. Ask someone to hold it for you, if possible. Clearly mark and make sure that you can see: Any grab bars or handrails. First and last steps. Where the edge of each step is. Use tools that help you move around (mobility aids) if they are needed. These include: Canes. Walkers. Scooters. Crutches. Turn on the lights when you go into a dark area. Replace any light bulbs as soon as they burn out. Set up your furniture so you have a clear path. Avoid moving your furniture around. If any of your floors are uneven, fix them. If there are any pets around you, be aware of where they are. Review your medicines with your doctor. Some medicines can make you feel dizzy. This can increase your chance of falling. Ask your doctor what other things that you can do to help prevent falls. This information is not intended to replace advice given to you by your health care provider. Make sure you discuss any questions you have with your health care provider. Document Released: 12/06/2008 Document Revised: 07/18/2015 Document Reviewed: 03/16/2014 Elsevier Interactive Patient Education  2017 [employer].

## 2022-07-06 NOTE — Progress Notes (Signed)
I connected with  Madison Huerta on 07/06/22 by a audio enabled telemedicine application and verified that I am speaking with the correct person using two identifiers.  Patient Location: Home  Provider Location: Office/Clinic  I discussed the limitations of evaluation and management by telemedicine. The patient expressed understanding and agreed to proceed.  Subjective:   Madison Huerta is a 82 y.o. female who presents for Medicare Annual (Subsequent) preventive examination.  Patient Medicare AWV questionnaire was completed by the patient on 07/02/2022; I have confirmed that all information answered by patient is correct and no changes since this date.     Review of Systems     Cardiac Risk Factors include: advanced age (>34men, >12 women);hypertension     Objective:    Today's Vitals   07/06/22 1126  Weight: 127 lb (57.6 kg)  Height: 5' (1.524 m)   Body mass index is 24.8 kg/m.     07/06/2022   11:30 AM 07/02/2021   10:12 AM 06/25/2021    8:57 AM 07/02/2020   10:33 AM 05/28/2020    8:18 AM 07/19/2019    9:40 AM 05/24/2019    8:07 AM  Advanced Directives  Does Patient Have a Medical Advance Directive? Yes Yes Yes No Yes Yes Yes  Type of Estate agent of [place];Living will Healthcare Power of [place];Living will Healthcare Power of [place];Living will  Healthcare Power of [place];Living will Healthcare Power of [place];Living will Healthcare Power of [place];Living will  Does patient want to make changes to medical advance directive?       No - Patient declined  Copy of Healthcare Power of Attorney in Chart? No - copy requested  No - copy requested  Yes - validated most recent copy scanned in chart (See row information)  Yes - validated most recent copy scanned in chart (See row information)  Would patient like information on creating a medical advance directive?    No - Patient declined       Current Medications (verified) Outpatient Encounter  Medications as of 07/06/2022  Medication Sig   acetaminophen (TYLENOL) 500 MG tablet Take 500 mg by mouth every 4 (four) hours as needed for fever.   amLODipine (NORVASC) 10 MG tablet TAKE 1 TABLET(10 MG) BY MOUTH DAILY   cholecalciferol (VITAMIN D) 1000 units tablet Take 1,000 Units by mouth daily.   levothyroxine (SYNTHROID) 125 MCG tablet Take 0.5 tablets (62.5 mcg total) by mouth daily.   rosuvastatin (CRESTOR) 10 MG tablet Take 1 tablet (10 mg total) by mouth daily.   No facility-administered encounter medications on file as of 07/06/2022.    Allergies (verified) Codeine, Penicillins, and Adhesive [tape]   History: Past Medical History:  Diagnosis Date   Breast cancer (HCC) 1994   L breast- lumpectomy, care for at [facility]    Breast cancer [facility]) 2013   right breast- mastectomy, care at [facility]   Cataract    Encounter for routine gynecological examination 02/26/2015   Endocarditis    GERD (gastroesophageal reflux disease)    Heart murmur    VSD- echocardiogram - [facility] 10 yrs. ago   Hyperlipidemia    Hypertension    Hypothyroidism    Osteopenia    Personal history of radiation therapy 1994   left breast ca   Thyroid disease    Hypothyroidism   VSD (ventricular septal defect)    does need SBE prophylaxis (Keflex  3 gm 1 hour pprior and 1.5 gm 6 hours post   Past Surgical  History:  Procedure Laterality Date   2D echo  2004   [facility]   AUGMENTATION MAMMAPLASTY Bilateral    removed 1994   BREAST BIOPSY Left 1994   breast ca   BREAST BIOPSY Right 2013   breast ca   BREAST IMPLANT REMOVAL  1994   BREAST LUMPECTOMY Left 1994   lumpectomy and rad tx and tamoxifen   MASTECTOMY Right 01/08/2012   complete and anastrozole   MASTECTOMY W/ SENTINEL NODE BIOPSY  01/08/2012   Procedure: MASTECTOMY WITH SENTINEL LYMPH NODE BIOPSY;  Surgeon: Lodema Pilot, DO;  Location: [facility] OR;  Service: General;  Laterality: Right;  right breast mastectomy with sentinel lymph node biopsy     PARATHYROIDECTOMY  12/2002   TONSILLECTOMY     As a child   TUBAL LIGATION     1976   Family History  Problem Relation Age of Onset   Aneurysm Mother        brain   Cancer Father        Lung, Liver mets   Lung cancer Father 34   Heart disease Other    Cancer Cousin        Breast CA   Breast cancer Cousin 30   Breast cancer Maternal Aunt 23   Social History   Socioeconomic History   Marital status: Widowed    Spouse name: Not on file   Number of children: 2   Years of education: Not on file   Highest education level: 12th grade  Occupational History   Occupation: Landscape architect: UNEMPLOYED  Tobacco Use   Smoking status: Never   Smokeless tobacco: Never  Vaping Use   Vaping Use: Never used  Substance and Sexual Activity   Alcohol use: Yes    Alcohol/week: 3.0 standard drinks of alcohol    Types: 3 Glasses of wine per week   Drug use: No   Sexual activity: Never  Other Topics Concern   Not on file  Social History Narrative   Not on file   Social Determinants of Health   Financial Resource Strain: Low Risk  (07/02/2022)   Overall Financial Resource Strain (CARDIA)    Difficulty of Paying Living Expenses: Not hard at all  Food Insecurity: No Food Insecurity (07/02/2022)   Hunger Vital Sign    Worried About Running Out of Food in the Last Year: Never true    Ran Out of Food in the Last Year: Never true  Transportation Needs: No Transportation Needs (07/02/2022)   PRAPARE - Administrator, Civil Service (Medical): No    Lack of Transportation (Non-Medical): No  Physical Activity: Sufficiently Active (07/02/2022)   Exercise Vital Sign    Days of Exercise per Week: 6 days    Minutes of Exercise per Session: 40 min  Stress: No Stress Concern Present (07/02/2022)   [employer] of Occupational Health - Occupational Stress Questionnaire    Feeling of Stress : Not at all  Social Connections: Moderately Isolated (07/02/2022)   Social Connection  and Isolation Panel [NHANES]    Frequency of Communication with Friends and Family: More than three times a week    Frequency of Social Gatherings with Friends and Family: Twice a week    Attends Religious Services: Never    Database administrator or Organizations: Yes    Attends Engineer, structural: More than 4 times per year    Marital Status: Widowed    Tobacco  Counseling Counseling given: Not Answered   Clinical Intake:  Pre-visit preparation completed: Yes  Pain : No/denies pain     Nutritional Status: BMI of 19-24  Normal Nutritional Risks: None Diabetes: No  How often do you need to have someone help you when you read instructions, pamphlets, or other written materials from your doctor or pharmacy?: 1 - Never  Diabetic? no  Interpreter Needed?: No  Information entered by :: NAllen LPN   Activities of Daily Living    07/02/2022    7:34 PM  In your present state of health, do you have any difficulty performing the following activities:  Hearing? 0  Vision? 0  Difficulty concentrating or making decisions? 0  Walking or climbing stairs? 0  Dressing or bathing? 0  Doing errands, shopping? 0  Preparing Food and eating ? N  Using the Toilet? N  In the past six months, have you accidently leaked urine? N  Do you have problems with loss of bowel control? N  Managing your Medications? N  Managing your Finances? N  Housekeeping or managing your Housekeeping? N    Patient Care Team: Mliss Sax, MD as PCP - General (Family Medicine) Earna Coder, MD as Consulting Physician (Internal Medicine) Verdell Face, DDS as Consulting Physician (Dentistry) Randal Buba, OD as Consulting Physician (Optometry)  Indicate any recent Medical Services you may have received from other than [facility] providers in the past year (date may be approximate).     Assessment:   This is a routine wellness examination for [place].  Hearing/Vision  screen Vision Screening - Comments:: Regular eye exams, [facility]  Dietary issues and exercise activities discussed: Current Exercise Habits: Home exercise routine, Type of exercise: walking, Time (Minutes): 40, Frequency (Times/Week): 6, Weekly Exercise (Minutes/Week): 240   Goals Addressed             This Visit's Progress    Patient Stated       07/06/2022, no goals       Depression Screen    07/06/2022   11:31 AM 06/01/2022   10:01 AM 03/30/2022    8:10 AM 03/05/2022    9:41 AM 06/25/2021    8:58 AM 06/25/2021    8:55 AM 02/28/2021    8:38 AM  PHQ 2/9 Scores  PHQ - 2 Score 0 0 0 0 0 0 0    Fall Risk    07/02/2022    7:34 PM 06/01/2022   10:01 AM 03/30/2022    8:07 AM 06/25/2021    8:58 AM 02/28/2021    8:38 AM  Fall Risk   Falls in the past year? 0 0 0 0 0  Number falls in past yr: 0 0 0 0   Injury with Fall? 0 0 0 0   Risk for fall due to : Medication side effect No Fall Risks No Fall Risks    Follow up Falls prevention discussed;Education provided;Falls evaluation completed Falls evaluation completed Falls evaluation completed      FALL RISK PREVENTION PERTAINING TO THE HOME:  Any stairs in or around the home? No  If so, are there any without handrails? N/a Home free of loose throw rugs in walkways, pet beds, electrical cords, etc? Yes  Adequate lighting in your home to reduce risk of falls? Yes   ASSISTIVE DEVICES UTILIZED TO PREVENT FALLS:  Life alert? No  Use of a cane, walker or w/c? No  Grab bars in the bathroom? Yes  Shower chair or  bench in shower? No  Elevated toilet seat or a handicapped toilet? Yes   TIMED UP AND GO:  Was the test performed? No .      Cognitive Function:    03/04/2018   10:25 AM 03/03/2017   11:23 AM 02/21/2016    9:28 AM  MMSE - Mini Mental State Exam  Orientation to time 5 5 5   Orientation to Place 5 5 5   Registration 3 3 3   Attention/ Calculation 0 0 0  Recall 3 3 3   Language- name 2 objects 0 0 0  Language- repeat 1 1  1   Language- follow 3 step command 3 3 3   Language- read & follow direction 0 0 0  Write a sentence 0 0 0  Copy design 0 0 0  Total score 20 20 20         07/06/2022   11:31 AM  6CIT Screen  What Year? 0 points  What month? 0 points  What time? 0 points  Count back from 20 0 points  Months in reverse 2 points  Repeat phrase 0 points  Total Score 2 points    Immunizations Immunization History  Administered Date(s) Administered   Fluad Quad(high Dose 65+) 10/15/2018, 11/21/2021   Influenza Split 11/20/2010, 10/22/2020   Influenza Whole 12/15/2006, 01/23/2009, 12/13/2009, 11/16/2011   Influenza, High Dose Seasonal PF 12/01/2014, 11/18/2016, 12/17/2017   Influenza,inj,Quad PF,6+ Mos 10/21/2015   Influenza-Unspecified 11/22/2012, 10/07/2013, 10/25/2019   PFIZER(Purple Top)SARS-COV-2 Vaccination 03/15/2019, 04/05/2019, 12/25/2019, 10/22/2020   Pneumococcal Conjugate-13 02/21/2014   Pneumococcal Polysaccharide-23 01/14/2004, 02/12/2010   Td 11/28/2001   Tdap 02/18/2011, 02/28/2021   Zoster Recombinat (Shingrix) 05/25/2017, 09/15/2017   Zoster, Live 02/06/2008    TDAP status: Up to date  Flu Vaccine status: Up to date  Pneumococcal vaccine status: Up to date  Covid-19 vaccine status: Completed vaccines  Qualifies for Shingles Vaccine? Yes   Zostavax completed Yes   Shingrix Completed?: Yes  Screening Tests Health Maintenance  Topic Date Due   Medicare Annual Wellness (AWV)  06/26/2022   COVID-19 Vaccine (5 - 2023-24 season) 06/17/2023 (Originally 10/24/2021)   PAP SMEAR-Modifier  03/04/2027 (Originally 02/25/2017)   INFLUENZA VACCINE  09/24/2022   MAMMOGRAM  03/13/2023   DTaP/Tdap/Td (4 - Td or Tdap) 03/01/2031   Pneumonia Vaccine 66+ Years old  Completed   DEXA SCAN  Completed   Zoster Vaccines- Shingrix  Completed   HPV VACCINES  Aged Out    Health Maintenance  Health Maintenance Due  Topic Date Due   Medicare Annual Wellness (AWV)  06/26/2022     Colorectal cancer screening: No longer required.   Mammogram status: Completed 03/12/2022. Repeat every year  Bone Density status: Completed 06/25/2020.   Lung Cancer Screening: (Low Dose CT Chest recommended if Age 31-80 years, 30 pack-year currently smoking OR have quit w/in 15years.) does not qualify.   Lung Cancer Screening Referral: no  Additional Screening:  Hepatitis C Screening: does not qualify;   Vision Screening: Recommended annual ophthalmology exams for early detection of glaucoma and other disorders of the eye. Is the patient up to date with their annual eye exam?  Yes  Who is the provider or what is the name of the office in which the patient attends annual eye exams? [facility] If pt is not established with a provider, would they like to be referred to a provider to establish care? No .   Dental Screening: Recommended annual dental exams for proper oral hygiene  [facility]  Referral / Chronic Care Management: CRR required this visit?  No   CCM required this visit?  No      Plan:     I have personally reviewed and noted the following in the patient's chart:   Medical and social history Use of alcohol, tobacco or illicit drugs  Current medications and supplements including opioid prescriptions. Patient is not currently taking opioid prescriptions. Functional ability and status Nutritional status Physical activity Advanced directives List of other physicians Hospitalizations, surgeries, and ER visits in previous 12 months Vitals Screenings to include cognitive, depression, and falls Referrals and appointments  In addition, I have reviewed and discussed with patient certain preventive protocols, quality metrics, and best practice recommendations. A written personalized care plan for preventive services as well as general preventive health recommendations were provided to patient.     Barb Merino, LPN   07/21/4130   Nurse Notes:  none  Due to this being a virtual visit, the after visit summary with patients personalized plan was offered to patient via mail or my-chart.  Patient would like to access on my-chart

## 2022-07-08 ENCOUNTER — Other Ambulatory Visit: Payer: Self-pay | Admitting: Family Medicine

## 2022-07-08 DIAGNOSIS — E039 Hypothyroidism, unspecified: Secondary | ICD-10-CM

## 2022-07-31 DIAGNOSIS — H2513 Age-related nuclear cataract, bilateral: Secondary | ICD-10-CM | POA: Diagnosis not present

## 2022-07-31 DIAGNOSIS — H04123 Dry eye syndrome of bilateral lacrimal glands: Secondary | ICD-10-CM | POA: Diagnosis not present

## 2022-07-31 DIAGNOSIS — H0102A Squamous blepharitis right eye, upper and lower eyelids: Secondary | ICD-10-CM | POA: Diagnosis not present

## 2022-07-31 DIAGNOSIS — H35363 Drusen (degenerative) of macula, bilateral: Secondary | ICD-10-CM | POA: Diagnosis not present

## 2022-09-08 DIAGNOSIS — H2511 Age-related nuclear cataract, right eye: Secondary | ICD-10-CM | POA: Diagnosis not present

## 2022-09-28 ENCOUNTER — Ambulatory Visit (INDEPENDENT_AMBULATORY_CARE_PROVIDER_SITE_OTHER): Payer: Medicare Other | Admitting: Family Medicine

## 2022-09-28 ENCOUNTER — Encounter: Payer: Self-pay | Admitting: Family Medicine

## 2022-09-28 VITALS — BP 142/66 | HR 72 | Temp 98.1°F | Ht 60.0 in | Wt 123.6 lb

## 2022-09-28 DIAGNOSIS — E782 Mixed hyperlipidemia: Secondary | ICD-10-CM | POA: Diagnosis not present

## 2022-09-28 DIAGNOSIS — I10 Essential (primary) hypertension: Secondary | ICD-10-CM

## 2022-09-28 DIAGNOSIS — E039 Hypothyroidism, unspecified: Secondary | ICD-10-CM | POA: Diagnosis not present

## 2022-09-28 DIAGNOSIS — R7309 Other abnormal glucose: Secondary | ICD-10-CM

## 2022-09-28 LAB — BASIC METABOLIC PANEL
BUN: 11 mg/dL (ref 6–23)
CO2: 24 mEq/L (ref 19–32)
Calcium: 9.9 mg/dL (ref 8.4–10.5)
Chloride: 104 mEq/L (ref 96–112)
Creatinine, Ser: 0.87 mg/dL (ref 0.40–1.20)
GFR: 62.98 mL/min (ref >60.00)
Glucose, Bld: 94 mg/dL (ref 70–99)
Potassium: 3.5 mEq/L (ref 3.5–5.1)
Sodium: 141 mEq/L (ref 135–145)

## 2022-09-28 LAB — TSH: TSH: 0.11 u[IU]/mL — ABNORMAL LOW (ref 0.35–5.50)

## 2022-09-28 LAB — LDL CHOLESTEROL, DIRECT: Direct LDL: 102 mg/dL

## 2022-09-28 LAB — HEMOGLOBIN A1C: Hgb A1c MFr Bld: 5.5 % (ref 4.6–6.5)

## 2022-09-28 NOTE — Progress Notes (Signed)
Established Patient Office Visit   Subjective:  Patient ID: Madison Huerta, female    DOB: [birth year]  Age: 82 y.o. MRN: [id number]  Chief Complaint  Patient presents with   Medical Management of Chronic Issues    6 month follow up. Pt is fasting.     HPI Encounter Diagnoses  Name Primary?   Essential hypertension Yes   Hypothyroidism, unspecified type    Elevated glucose    Mixed hyperlipidemia    Doing well.  Starting to feel more like herself after her husband's passing 2 and half years ago.  Has been active physically by walking and lifting at home.  Scheduled for her first cataract extraction on the 26th.  Continues with 62.5 mcg of levothyroxine.   Review of Systems  Constitutional: Negative.   HENT: Negative.    Eyes:  Negative for blurred vision, discharge and redness.  Respiratory: Negative.    Cardiovascular: Negative.   Gastrointestinal:  Negative for abdominal pain.  Genitourinary: Negative.   Musculoskeletal: Negative.  Negative for myalgias.  Skin:  Negative for rash.  Neurological:  Negative for tingling, loss of consciousness and weakness.  Endo/Heme/Allergies:  Negative for polydipsia.     Current Outpatient Medications:    acetaminophen (TYLENOL) 500 MG tablet, Take 500 mg by mouth every 4 (four) hours as needed for fever., Disp: , Rfl:    amLODipine (NORVASC) 10 MG tablet, TAKE 1 TABLET(10 MG) BY MOUTH DAILY, Disp: 90 tablet, Rfl: 1   cholecalciferol (VITAMIN D) 1000 units tablet, Take 1,000 Units by mouth daily., Disp: , Rfl:    levothyroxine (SYNTHROID) 125 MCG tablet, Take 0.5 tablets (62.5 mcg total) by mouth daily., Disp: 45 tablet, Rfl: 1   rosuvastatin (CRESTOR) 10 MG tablet, Take 1 tablet (10 mg total) by mouth daily., Disp: 90 tablet, Rfl: 1   Objective:     BP (!) 142/66   Pulse 72   Temp 98.1 F (36.7 C)   Ht 5' (1.524 m)   Wt 123 lb 9.6 oz (56.1 kg)   SpO2 98%   BMI 24.14 kg/m  BP Readings from Last 3 Encounters:  09/28/22 (!)  142/66  06/01/22 136/68  03/30/22 (!) 140/62   Wt Readings from Last 3 Encounters:  09/28/22 123 lb 9.6 oz (56.1 kg)  07/06/22 127 lb (57.6 kg)  06/01/22 127 lb 12.8 oz (58 kg)      Physical Exam Constitutional:      General: She is not in acute distress.    Appearance: Normal appearance. She is not ill-appearing, toxic-appearing or diaphoretic.  HENT:     Head: Normocephalic and atraumatic.     Right Ear: External ear normal.     Left Ear: External ear normal.  Eyes:     General: No scleral icterus.       Right eye: No discharge.        Left eye: No discharge.     Extraocular Movements: Extraocular movements intact.     Conjunctiva/sclera: Conjunctivae normal.  Pulmonary:     Effort: Pulmonary effort is normal. No respiratory distress.  Skin:    General: Skin is warm and dry.  Neurological:     Mental Status: She is alert and oriented to person, place, and time.  Psychiatric:        Mood and Affect: Mood normal.        Behavior: Behavior normal.      No results found for any visits on 09/28/22.    The  ASCVD Risk score (Arnett DK, et al., 2019) failed to calculate for the following reasons:   The 2019 ASCVD risk score is only valid for ages 28 to 34    Assessment & Plan:   Essential hypertension -     Basic metabolic panel  Hypothyroidism, unspecified type -     TSH  Elevated glucose -     Basic metabolic panel -     Hemoglobin A1c  Mixed hyperlipidemia -     LDL cholesterol, direct    Return in about 3 months (around 12/29/2022).  Medications adjusted pending results of labs.  Mliss Sax, MD

## 2022-10-05 ENCOUNTER — Encounter: Payer: Self-pay | Admitting: Family Medicine

## 2022-10-05 ENCOUNTER — Ambulatory Visit (INDEPENDENT_AMBULATORY_CARE_PROVIDER_SITE_OTHER): Payer: Medicare Other | Admitting: Family Medicine

## 2022-10-05 VITALS — BP 134/70 | HR 74 | Temp 98.6°F | Ht 60.0 in | Wt 125.4 lb

## 2022-10-05 DIAGNOSIS — E039 Hypothyroidism, unspecified: Secondary | ICD-10-CM

## 2022-10-05 NOTE — Progress Notes (Signed)
Established Patient Office Visit   Subjective:  Patient ID: Madison Huerta, female    DOB: [birth year]  Age: 82 y.o. MRN: [id number]  Chief Complaint  Patient presents with   Medical Management of Chronic Issues    Check thyroid medication.     HPI Encounter Diagnoses  Name Primary?   Hypothyroidism, unspecified type Yes   For follow-up of hypothyroidism.  TSH was lower on the lower dose of levothyroxine at one half of 125 mcg tablet.  She brings in a pill bottle that is marked 125 mcg with the instructions to take one half a tablet each day.  She has been using a pill cutter.  She takes a half a tablet each day on a fasting stomach   Review of Systems  Constitutional: Negative.   HENT: Negative.    Eyes:  Negative for blurred vision, discharge and redness.  Respiratory: Negative.    Cardiovascular: Negative.   Gastrointestinal:  Negative for abdominal pain.  Genitourinary: Negative.   Musculoskeletal: Negative.  Negative for myalgias.  Skin:  Negative for rash.  Neurological:  Negative for tingling, loss of consciousness and weakness.  Endo/Heme/Allergies:  Negative for polydipsia.     Current Outpatient Medications:    levothyroxine (SYNTHROID) 50 MCG tablet, Take 1 tablet (50 mcg total) by mouth daily., Disp: 90 tablet, Rfl: 0   acetaminophen (TYLENOL) 500 MG tablet, Take 500 mg by mouth every 4 (four) hours as needed for fever., Disp: , Rfl:    amLODipine (NORVASC) 10 MG tablet, TAKE 1 TABLET(10 MG) BY MOUTH DAILY, Disp: 90 tablet, Rfl: 1   cholecalciferol (VITAMIN D) 1000 units tablet, Take 1,000 Units by mouth daily., Disp: , Rfl:    rosuvastatin (CRESTOR) 10 MG tablet, Take 1 tablet (10 mg total) by mouth daily., Disp: 90 tablet, Rfl: 1   Objective:     BP 134/70   Pulse 74   Temp 98.6 F (37 C)   Ht 5' (1.524 m)   Wt 125 lb 6.4 oz (56.9 kg)   SpO2 96%   BMI 24.49 kg/m    Physical Exam Constitutional:      General: She is not in acute distress.     Appearance: Normal appearance. She is not ill-appearing, toxic-appearing or diaphoretic.  HENT:     Head: Normocephalic and atraumatic.     Right Ear: External ear normal.     Left Ear: External ear normal.  Eyes:     General: No scleral icterus.       Right eye: No discharge.        Left eye: No discharge.     Extraocular Movements: Extraocular movements intact.     Conjunctiva/sclera: Conjunctivae normal.  Pulmonary:     Effort: Pulmonary effort is normal. No respiratory distress.  Skin:    General: Skin is warm and dry.  Neurological:     Mental Status: She is alert and oriented to person, place, and time.  Psychiatric:        Mood and Affect: Mood normal.        Behavior: Behavior normal.      Results for orders placed or performed in visit on 10/05/22  TSH  Result Value Ref Range   TSH 0.11 (L) 0.35 - 5.50 uIU/mL      The ASCVD Risk score (Arnett DK, et al., 2019) failed to calculate for the following reasons:   The 2019 ASCVD risk score is only valid for ages 3 to 52  Assessment & Plan:   Hypothyroidism, unspecified type -     TSH -     Levothyroxine Sodium; Take 1 tablet (50 mcg total) by mouth daily.  Dispense: 90 tablet; Refill: 0    Return Has follow-up appointment in October..  Patient will check with her pharmacist to make sure that the pill she has is the 0.125 mg dose(she is taking 1/2 or 62.52mcg).  Rechecking TSH.  If TSH remains low.  Will start the 50 mcg dose.  Mliss Sax, MD  8/13 addendum: Follow-up TSH remains low.  Have sent 50 mcg dose to pharmacy.  Will inform patient.

## 2022-10-06 MED ORDER — LEVOTHYROXINE SODIUM 50 MCG PO TABS: 50.0000 ug | ORAL_TABLET | Freq: Every day | ORAL | 0 refills | Status: DC

## 2022-10-06 NOTE — Addendum Note (Signed)
Addended by: Andrez Grime on: 10/06/2022 12:47 PM   Modules accepted: Orders

## 2022-10-20 DIAGNOSIS — H2511 Age-related nuclear cataract, right eye: Secondary | ICD-10-CM | POA: Diagnosis not present

## 2022-10-28 DIAGNOSIS — H2512 Age-related nuclear cataract, left eye: Secondary | ICD-10-CM | POA: Diagnosis not present

## 2022-11-17 DIAGNOSIS — H2512 Age-related nuclear cataract, left eye: Secondary | ICD-10-CM | POA: Diagnosis not present

## 2022-12-07 ENCOUNTER — Ambulatory Visit: Payer: Medicare Other | Admitting: Family Medicine

## 2022-12-21 ENCOUNTER — Other Ambulatory Visit: Payer: Self-pay | Admitting: Family Medicine

## 2022-12-21 ENCOUNTER — Other Ambulatory Visit: Payer: Self-pay

## 2022-12-21 DIAGNOSIS — E7800 Pure hypercholesterolemia, unspecified: Secondary | ICD-10-CM

## 2022-12-21 DIAGNOSIS — I10 Essential (primary) hypertension: Secondary | ICD-10-CM

## 2022-12-21 MED ORDER — AMLODIPINE BESYLATE 10 MG PO TABS
ORAL_TABLET | ORAL | 1 refills | Status: DC
Start: 2022-12-21 — End: 2023-06-28

## 2022-12-28 ENCOUNTER — Encounter: Payer: Self-pay | Admitting: Family Medicine

## 2022-12-28 ENCOUNTER — Ambulatory Visit (INDEPENDENT_AMBULATORY_CARE_PROVIDER_SITE_OTHER): Payer: Medicare Other | Admitting: Family Medicine

## 2022-12-28 VITALS — BP 144/72 | HR 79 | Temp 98.2°F | Ht 60.0 in | Wt 122.4 lb

## 2022-12-28 DIAGNOSIS — L989 Disorder of the skin and subcutaneous tissue, unspecified: Secondary | ICD-10-CM | POA: Diagnosis not present

## 2022-12-28 DIAGNOSIS — I10 Essential (primary) hypertension: Secondary | ICD-10-CM

## 2022-12-28 DIAGNOSIS — E039 Hypothyroidism, unspecified: Secondary | ICD-10-CM

## 2022-12-28 DIAGNOSIS — Z853 Personal history of malignant neoplasm of breast: Secondary | ICD-10-CM

## 2022-12-28 LAB — TSH: TSH: 4.91 u[IU]/mL (ref 0.35–5.50)

## 2022-12-28 MED ORDER — LEVOTHYROXINE SODIUM 50 MCG PO TABS: 50.0000 ug | ORAL_TABLET | Freq: Every day | ORAL | 3 refills | Status: DC

## 2022-12-28 NOTE — Addendum Note (Signed)
Addended by: Nadene Rubins A on: 12/28/2022 05:00 PM   Modules accepted: Orders

## 2022-12-28 NOTE — Progress Notes (Addendum)
Pends.  Comes [name]'s  Established Patient Office Visit   Subjective:  Patient ID: Madison Huerta, female    DOB: 03/29/1940  Age: 82 y.o. MRN: [id number]  Chief Complaint  Patient presents with   Medical Management of Chronic Issues    3 month follow up. Pt is fasting.    Rash    Rash on left arm x 3 months.     Rash   Encounter Diagnoses  Name Primary?   White coat syndrome with diagnosis of hypertension Yes   Hypothyroidism, unspecified type    Skin lesion of left arm    BREAST CANCER, HX OF    Follow-up of above.  Blood pressure well-controlled with amlodipine 10 mg.  Home BP cuff has been calibrated here at the clinic.  Needs furl for mammogram of the left breast.  She is status post lumpectomy of this breast.  Needs yearly surveillance.  Status post right mastectomy.  New skin lesion on her left upper arm.  Has been taking the 50 mcg dose of levothyroxine daily.   Review of Systems  Constitutional: Negative.   HENT: Negative.    Eyes:  Negative for blurred vision, discharge and redness.  Respiratory: Negative.    Cardiovascular: Negative.   Gastrointestinal:  Negative for abdominal pain.  Genitourinary: Negative.   Musculoskeletal: Negative.  Negative for myalgias.  Skin:  Positive for rash.  Neurological:  Negative for tingling, loss of consciousness and weakness.  Endo/Heme/Allergies:  Negative for polydipsia.     Current Outpatient Medications:    acetaminophen (TYLENOL) 500 MG tablet, Take 500 mg by mouth every 4 (four) hours as needed for fever., Disp: , Rfl:    amLODipine (NORVASC) 10 MG tablet, TAKE 1 TABLET(10 MG) BY MOUTH DAILY, Disp: 90 tablet, Rfl: 1   cholecalciferol (VITAMIN D) 1000 units tablet, Take 1,000 Units by mouth daily., Disp: , Rfl:    rosuvastatin (CRESTOR) 10 MG tablet, TAKE 1 TABLET(10 MG) BY MOUTH DAILY, Disp: 90 tablet, Rfl: 1   levothyroxine (SYNTHROID) 50 MCG tablet, Take 1 tablet (50 mcg total) by mouth daily., Disp: 90 tablet,  Rfl: 3   Objective:     BP (!) 144/72   Pulse 79   Temp 98.2 F (36.8 C)   Ht 5' (1.524 m)   Wt 122 lb 6.4 oz (55.5 kg)   SpO2 97%   BMI 23.90 kg/m    Physical Exam Constitutional:      General: She is not in acute distress.    Appearance: Normal appearance. She is not ill-appearing, toxic-appearing or diaphoretic.  HENT:     Head: Normocephalic and atraumatic.     Right Ear: External ear normal.     Left Ear: External ear normal.  Eyes:     General: No scleral icterus.       Right eye: No discharge.        Left eye: No discharge.     Extraocular Movements: Extraocular movements intact.     Conjunctiva/sclera: Conjunctivae normal.  Pulmonary:     Effort: Pulmonary effort is normal. No respiratory distress.  Skin:    General: Skin is warm and dry.       Neurological:     Mental Status: She is alert and oriented to person, place, and time.  Psychiatric:        Mood and Affect: Mood normal.        Behavior: Behavior normal.      Results for orders placed or  performed in visit on 12/28/22  TSH  Result Value Ref Range   TSH 4.91 0.35 - 5.50 uIU/mL      The ASCVD Risk score (Arnett DK, et al., 2019) failed to calculate for the following reasons:   The 2019 ASCVD risk score is only valid for ages 33 to 13    Assessment & Plan:   White coat syndrome with diagnosis of hypertension  Hypothyroidism, unspecified type -     TSH -     Levothyroxine Sodium; Take 1 tablet (50 mcg total) by mouth daily.  Dispense: 90 tablet; Refill: 3  Skin lesion of left arm -     Ambulatory referral to Dermatology  BREAST CANCER, HX OF -     3D Screening Mammogram, Left; Future    Return in about 6 months (around 06/27/2023).  Will need to refill levothyroxine pending results of TSH.  Mliss Sax, MD

## 2023-02-08 NOTE — Progress Notes (Unsigned)
HPI: Follow-up VSD. Patient has a history of small ventricular septal defect previously followed by Dr. Mariah Milling. She also has a history of endocarditis of the aortic valve. Last transesophageal echocardiogram October 2016 showed normal LV function, small mobile vegetation on the left coronary cusp, mild mitral regurgitation and small membranous VSD.  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  Since last seen  Current Outpatient Medications  Medication Sig Dispense Refill   acetaminophen (TYLENOL) 500 MG tablet Take 500 mg by mouth every 4 (four) hours as needed for fever.     amLODipine (NORVASC) 10 MG tablet TAKE 1 TABLET(10 MG) BY MOUTH DAILY 90 tablet 1   cholecalciferol (VITAMIN D) 1000 units tablet Take 1,000 Units by mouth daily.     levothyroxine (SYNTHROID) 50 MCG tablet Take 1 tablet (50 mcg total) by mouth daily. 90 tablet 3   rosuvastatin (CRESTOR) 10 MG tablet TAKE 1 TABLET(10 MG) BY MOUTH DAILY 90 tablet 1   No current facility-administered medications for this visit.     Past Medical History:  Diagnosis Date   Breast cancer (HCC) 1994   L breast- lumpectomy, care for at [facility]    Breast cancer [facility]) 2013   right breast- mastectomy, care at [facility]   Cataract    Encounter for routine gynecological examination 02/26/2015   Endocarditis    GERD (gastroesophageal reflux disease)    Heart murmur    VSD- echocardiogram - [facility] 10 yrs. ago   Hyperlipidemia    Hypertension    Hypothyroidism    Osteopenia    Personal history of radiation therapy 1994   left breast ca   Thyroid disease    Hypothyroidism   VSD (ventricular septal defect)    does need SBE prophylaxis (Keflex  3 gm 1 hour pprior and 1.5 gm 6 hours post    Past Surgical  History:  Procedure Laterality Date   2D echo  2004   Normal at [facility]   AUGMENTATION MAMMAPLASTY Bilateral    removed 1994   BREAST BIOPSY Left 1994   breast ca   BREAST BIOPSY Right 2013   breast ca   BREAST IMPLANT REMOVAL  1994   BREAST LUMPECTOMY Left 1994   lumpectomy and rad tx and tamoxifen   MASTECTOMY Right 01/08/2012   complete and anastrozole   MASTECTOMY W/ SENTINEL NODE BIOPSY  01/08/2012   Procedure: MASTECTOMY WITH SENTINEL LYMPH NODE BIOPSY;  Surgeon: Lodema Pilot, DO;  Location: [facility] OR;  Service: General;  Laterality: Right;  right breast mastectomy with sentinel lymph node biopsy    PARATHYROIDECTOMY  12/2002   TONSILLECTOMY     As a child   TUBAL LIGATION     1976    Social History   Socioeconomic History   Marital status: Widowed    Spouse name: Not on file   Number of children: 2   Years of education: Not on file   Highest education level: 12th grade  Occupational History   Occupation: Landscape architect: UNEMPLOYED  Tobacco Use   Smoking status: Never   Smokeless tobacco: Never  Vaping Use   Vaping status: Never Used  Substance and Sexual Activity   Alcohol use: Yes    Alcohol/week: 3.0 standard drinks of alcohol    Types: 3 Glasses  of wine per week   Drug use: No   Sexual activity: Never  Other Topics Concern   Not on file  Social History Narrative   Not on file   Social Drivers of Health   Financial Resource Strain: Low Risk  (12/24/2022)   Overall Financial Resource Strain (CARDIA)    Difficulty of Paying Living Expenses: Not hard at all  Food Insecurity: No Food Insecurity (12/24/2022)   Hunger Vital Sign    Worried About Running Out of Food in the Last Year: Never true    Ran Out of Food in the Last Year: Never true  Transportation Needs: No Transportation Needs (12/24/2022)   PRAPARE - Administrator, Civil Service (Medical): No    Lack of Transportation (Non-Medical): No  Physical Activity: Sufficiently  Active (12/24/2022)   Exercise Vital Sign    Days of Exercise per Week: 5 days    Minutes of Exercise per Session: 40 min  Stress: No Stress Concern Present (12/24/2022)   [employer] of Occupational Health - Occupational Stress Questionnaire    Feeling of Stress : Not at all  Social Connections: Moderately Isolated (12/24/2022)   Social Connection and Isolation Panel [NHANES]    Frequency of Communication with Friends and Family: More than three times a week    Frequency of Social Gatherings with Friends and Family: More than three times a week    Attends Religious Services: Never    Database administrator or Organizations: Yes    Attends Engineer, structural: More than 4 times per year    Marital Status: Widowed  Intimate Partner Violence: Not At Risk (06/25/2021)   Humiliation, Afraid, Rape, and Kick questionnaire    Fear of Current or Ex-Partner: No    Emotionally Abused: No    Physically Abused: No    Sexually Abused: No    Family History  Problem Relation Age of Onset   Aneurysm Mother        brain   Cancer Father        Lung, Liver mets   Lung cancer Father 14   Heart disease Other    Cancer Cousin        Breast CA   Breast cancer Cousin 30   Breast cancer Maternal Aunt 55    ROS: no fevers or chills, productive cough, hemoptysis, dysphasia, odynophagia, melena, hematochezia, dysuria, hematuria, rash, seizure activity, orthopnea, PND, pedal edema, claudication. Remaining systems are negative.  Physical Exam: Well-developed well-nourished in no acute distress.  Skin is warm and dry.  HEENT is normal.  Neck is supple.  Chest is clear to auscultation with normal expansion.  Cardiovascular exam is regular rate and rhythm.  Abdominal exam nontender or distended. No masses palpated. Extremities show no edema. neuro grossly intact  ECG- personally reviewed  A/P  1 small ventricular septal defect-will likely plan repeat echocardiogram when she returns  in 1 year.  She is asymptomatic and previous echocardiogram showed normal RV size and function.  2 history of endocarditis-patient has had no recurrences.  Continue SBE prophylaxis.  3 hypertension-blood pressure controlled.  Continue present medical regimen.  4 hyperlipidemia-continue statin.  Olga Millers, MD

## 2023-02-10 ENCOUNTER — Ambulatory Visit: Payer: Medicare Other | Attending: Cardiology | Admitting: Cardiology

## 2023-02-10 ENCOUNTER — Encounter: Payer: Self-pay | Admitting: Cardiology

## 2023-02-10 VITALS — BP 158/62 | HR 82 | Ht 60.0 in | Wt 122.8 lb

## 2023-02-10 DIAGNOSIS — Q210 Ventricular septal defect: Secondary | ICD-10-CM

## 2023-02-10 DIAGNOSIS — I10 Essential (primary) hypertension: Secondary | ICD-10-CM

## 2023-02-10 DIAGNOSIS — E7800 Pure hypercholesterolemia, unspecified: Secondary | ICD-10-CM | POA: Diagnosis not present

## 2023-02-10 NOTE — Patient Instructions (Signed)
    Follow-Up: At [facility], you and your health needs are our priority.  As part of our continuing mission to provide you with exceptional heart care, we have created designated Provider Care Teams.  These Care Teams include your primary Cardiologist (physician) and Advanced Practice Providers (APPs -  Physician Assistants and Nurse Practitioners) who all work together to provide you with the care you need, when you need it.   Your next appointment:   12 month(s)  Provider:   Olga Millers, MD

## 2023-03-15 ENCOUNTER — Ambulatory Visit
Admission: RE | Admit: 2023-03-15 | Discharge: 2023-03-15 | Disposition: A | Discharge status: Home / Self Care | Payer: Medicare Other | Source: Ambulatory Visit | Attending: Family Medicine | Admitting: Family Medicine

## 2023-03-15 DIAGNOSIS — Z853 Personal history of malignant neoplasm of breast: Secondary | ICD-10-CM | POA: Insufficient documentation

## 2023-03-15 DIAGNOSIS — Z1231 Encounter for screening mammogram for malignant neoplasm of breast: Secondary | ICD-10-CM | POA: Diagnosis not present

## 2023-06-10 ENCOUNTER — Encounter: Payer: Self-pay | Admitting: Dermatology

## 2023-06-10 ENCOUNTER — Ambulatory Visit: Payer: Medicare Other | Admitting: Dermatology

## 2023-06-10 VITALS — BP 166/96

## 2023-06-10 DIAGNOSIS — L821 Other seborrheic keratosis: Secondary | ICD-10-CM

## 2023-06-10 DIAGNOSIS — D485 Neoplasm of uncertain behavior of skin: Secondary | ICD-10-CM

## 2023-06-10 DIAGNOSIS — D492 Neoplasm of unspecified behavior of bone, soft tissue, and skin: Secondary | ICD-10-CM | POA: Diagnosis not present

## 2023-06-10 DIAGNOSIS — B078 Other viral warts: Secondary | ICD-10-CM

## 2023-06-10 NOTE — Patient Instructions (Addendum)

## 2023-06-10 NOTE — Progress Notes (Signed)
   New Patient Visit   Subjective  Madison Huerta is a 82 y.o. female who presents for the following: New Pt - Skin Lesion  Patient states she has skin issue located at the B/L arm that she would like to have examined. Patient reports the areas have been there for 1 year. She reports the areas are bothersome.Patient rates irritation (itchy) 9 out of 10. Pt stated she think the areas of concern are boils. She states that the areas have spread. Patient reports she has not previously been treated for these areas. Patient denied Hx of bx. Patient reports family history of skin cancer(s)(mother - unsure of type).   The following portions of the chart were reviewed this encounter and updated as appropriate: medications, allergies, medical history  Review of Systems:  No other skin or systemic complaints except as noted in HPI or Assessment and Plan.  Objective  Well appearing patient in no apparent distress; mood and affect are within normal limits.   A focused examination was performed of the following areas: B/L arms   Relevant exam findings are noted in the Assessment and Plan.                 Left Shoulder - Anterior 6mm papule with cutaneous horn  Assessment & Plan   1. Left upper arm lesion - Assessment: 6mm papule with a cutaneous horn on the left upper arm, developed from an infected cyst 8 months ago. No longer painful. Concerning for possible keratoacanthoma, a type of squamous cell carcinoma. Differential diagnosis includes squamous cell carcinoma and benign reactive lesion. - Plan:    Perform shave biopsy of the left upper arm lesion    Send biopsy specimen for pathological examination    If biopsy confirms keratoacanthoma or squamous cell carcinoma:     - Refer patient to Dr. Caralyn Guile (surgeon) for excision    Provide wound care instructions:     - Apply Aquaphor ointment to biopsy site     - Keep area clean and covered with a bandage    Inform patient of biopsy  results:     - If benign, notify via MyChart     - If malignant, call patient to discuss and schedule follow-up  SEBORRHEIC KERATOSIS - Stuck-on, waxy, tan-brown papules and/or plaques  - Benign-appearing - Discussed benign etiology and prognosis. - Observe - Call for any changes  NEOPLASM OF UNCERTAIN BEHAVIOR OF SKIN Left Shoulder - Anterior Skin / nail biopsy Type of biopsy: tangential   Informed consent: discussed and consent obtained   Timeout: patient name, date of birth, surgical site, and procedure verified   Procedure prep:  Patient was prepped and draped in usual sterile fashion Prep type:  Isopropyl alcohol Anesthesia: the lesion was anesthetized in a standard fashion   Anesthetic:  1% lidocaine w/ epinephrine 1-100,000 buffered w/ 8.4% NaHCO3 Instrument used: DermaBlade   Hemostasis achieved with: aluminum chloride   Outcome: patient tolerated procedure well   Post-procedure details: sterile dressing applied and wound care instructions given   Dressing type: petrolatum gauze and bandage    No follow-ups on file.    Documentation: I have reviewed the above documentation for accuracy and completeness, and I agree with the above.   I, Shirron Marcha Solders, CMA, am acting as scribe for [employer], DO.   Langston Reusing, DO

## 2023-06-15 ENCOUNTER — Telehealth: Payer: Self-pay

## 2023-06-15 NOTE — Telephone Encounter (Signed)
 Spoke with patient and advised our office has not received her biopsy results and our office will be in contact once we receive her results. Patient voiced understanding.

## 2023-06-17 LAB — SURGICAL PATHOLOGY

## 2023-06-28 ENCOUNTER — Encounter: Payer: Self-pay | Admitting: Family Medicine

## 2023-06-28 ENCOUNTER — Encounter: Payer: Self-pay | Admitting: Dermatology

## 2023-06-28 ENCOUNTER — Ambulatory Visit (INDEPENDENT_AMBULATORY_CARE_PROVIDER_SITE_OTHER): Payer: Medicare Other | Admitting: Family Medicine

## 2023-06-28 VITALS — BP 158/78 | HR 64 | Temp 97.9°F | Ht 60.0 in | Wt 123.6 lb

## 2023-06-28 DIAGNOSIS — E039 Hypothyroidism, unspecified: Secondary | ICD-10-CM | POA: Diagnosis not present

## 2023-06-28 DIAGNOSIS — E782 Mixed hyperlipidemia: Secondary | ICD-10-CM

## 2023-06-28 DIAGNOSIS — I10 Essential (primary) hypertension: Secondary | ICD-10-CM

## 2023-06-28 LAB — BASIC METABOLIC PANEL WITH GFR
BUN: 10 mg/dL (ref 6–23)
CO2: 24 meq/L (ref 19–32)
Calcium: 9.7 mg/dL (ref 8.4–10.5)
Chloride: 102 meq/L (ref 96–112)
Creatinine, Ser: 0.93 mg/dL (ref 0.40–1.20)
GFR: 57.84 mL/min — ABNORMAL LOW (ref >60.00)
Glucose, Bld: 96 mg/dL (ref 70–99)
Potassium: 3.9 meq/L (ref 3.5–5.1)
Sodium: 136 meq/L (ref 135–145)

## 2023-06-28 LAB — LDL CHOLESTEROL, DIRECT: Direct LDL: 85 mg/dL

## 2023-06-28 LAB — TSH: TSH: 6.57 u[IU]/mL — ABNORMAL HIGH (ref 0.35–5.50)

## 2023-06-28 MED ORDER — LEVOTHYROXINE SODIUM 125 MCG PO TABS
62.5000 ug | ORAL_TABLET | Freq: Every day | ORAL | 0 refills | Status: DC
Start: 2023-06-28 — End: 2023-10-01

## 2023-06-28 MED ORDER — AMLODIPINE BESYLATE 10 MG PO TABS
ORAL_TABLET | ORAL | 1 refills | Status: AC
Start: 2023-06-28 — End: ?

## 2023-06-28 MED ORDER — LEVOTHYROXINE SODIUM 125 MCG PO TABS: 62.5000 ug | ORAL_TABLET | Freq: Every day | ORAL | 0 refills | Status: DC

## 2023-06-28 MED ORDER — ROSUVASTATIN CALCIUM 10 MG PO TABS: 10.0000 mg | ORAL_TABLET | Freq: Every day | ORAL | 1 refills | Status: DC

## 2023-06-28 NOTE — Addendum Note (Signed)
 Addended by: Delene Feinstein on: 06/28/2023 03:50 PM   Modules accepted: Orders

## 2023-06-28 NOTE — Progress Notes (Addendum)
 Established Patient Office Visit   Subjective:  Patient ID: Madison Huerta, female    DOB: 02/07/1941  Age: 83 y.o. MRN: [id number]  Chief Complaint  Patient presents with   Medical Management of Chronic Issues    6 month follow up. Pt is fasting.     HPI Encounter Diagnoses  Name Primary?   Mixed hyperlipidemia Yes   Hypothyroidism, unspecified type    White coat syndrome with diagnosis of hypertension    For follow-up of above.  Continues rosuvastatin  10 mg daily for hyperlipidemia.  Blood pressure well-controlled at home on 10 mg of amlodipine .  Daily.  Continue sleep fasting stomach for hypothyroidism.  She is active by walking.  Minimally active by playing bridge and socializing with her friends.  Recent follow-up with cardiology.  Mole check planned with dermatology.   Review of Systems  Constitutional: Negative.   HENT: Negative.    Eyes:  Negative for blurred vision, discharge and redness.  Respiratory: Negative.    Cardiovascular: Negative.   Gastrointestinal:  Negative for abdominal pain.  Genitourinary: Negative.   Musculoskeletal: Negative.  Negative for myalgias.  Skin:  Negative for rash.  Neurological:  Negative for tingling, loss of consciousness and weakness.  Endo/Heme/Allergies:  Negative for polydipsia.     Current Outpatient Medications:    acetaminophen  (TYLENOL ) 500 MG tablet, Take 500 mg by mouth every 4 (four) hours as needed for fever., Disp: , Rfl:    cholecalciferol (VITAMIN D ) 1000 units tablet, Take 1,000 Units by mouth daily., Disp: , Rfl:    amLODipine  (NORVASC ) 10 MG tablet, TAKE 1 TABLET(10 MG) BY MOUTH DAILY, Disp: 90 tablet, Rfl: 1   levothyroxine  (SYNTHROID ) 125 MCG tablet, Take 0.5 tablets (62.5 mcg total) by mouth daily before breakfast., Disp: 45 tablet, Rfl: 0   rosuvastatin  (CRESTOR ) 10 MG tablet, Take 1 tablet (10 mg total) by mouth daily., Disp: 90 tablet, Rfl: 1   Objective:     BP (!) 158/78 (BP Location: Right Arm,  Patient Position: Sitting, Cuff Size: Normal)   Pulse 64   Temp 97.9 F (36.6 C) (Temporal)   Ht 5' (1.524 m)   Wt 123 lb 9.6 oz (56.1 kg)   SpO2 98%   BMI 24.14 kg/m    Physical Exam Constitutional:      General: She is not in acute distress.    Appearance: Normal appearance. She is not ill-appearing, toxic-appearing or diaphoretic.  HENT:     Head: Normocephalic and atraumatic.     Right Ear: External ear normal.     Left Ear: External ear normal.     Mouth/Throat:     Mouth: Mucous membranes are moist.     Pharynx: Oropharynx is clear. No oropharyngeal exudate or posterior oropharyngeal erythema.  Eyes:     General: No scleral icterus.       Right eye: No discharge.        Left eye: No discharge.     Extraocular Movements: Extraocular movements intact.     Conjunctiva/sclera: Conjunctivae normal.     Pupils: Pupils are equal, round, and reactive to light.  Cardiovascular:     Rate and Rhythm: Normal rate and regular rhythm.  Pulmonary:     Effort: Pulmonary effort is normal. No respiratory distress.  Skin:    General: Skin is warm and dry.  Neurological:     Mental Status: She is alert and oriented to person, place, and time.  Psychiatric:  Mood and Affect: Mood normal.        Behavior: Behavior normal.      Results for orders placed or performed in visit on 06/28/23  TSH  Result Value Ref Range   TSH 6.57 (H) 0.35 - 5.50 uIU/mL  Basic metabolic panel with GFR  Result Value Ref Range   Sodium 136 135 - 145 mEq/L   Potassium 3.9 3.5 - 5.1 mEq/L   Chloride 102 96 - 112 mEq/L   CO2 24 19 - 32 mEq/L   Glucose, Bld 96 70 - 99 mg/dL   BUN 10 6 - 23 mg/dL   Creatinine, Ser 1.19 0.40 - 1.20 mg/dL   GFR 14.78 (L) >29.56 mL/min   Calcium  9.7 8.4 - 10.5 mg/dL  LDL cholesterol, direct  Result Value Ref Range   Direct LDL 85.0 mg/dL      The ASCVD Risk score (Arnett DK, et al., 2019) failed to calculate for the following reasons:   The 2019 ASCVD risk  score is only valid for ages 26 to 56    Assessment & Plan:   Mixed hyperlipidemia -     Rosuvastatin  Calcium ; Take 1 tablet (10 mg total) by mouth daily.  Dispense: 90 tablet; Refill: 1 -     LDL cholesterol, direct  Hypothyroidism, unspecified type -     TSH -     TSH; Future -     Levothyroxine  Sodium; Take 0.5 tablets (62.5 mcg total) by mouth daily before breakfast.  Dispense: 45 tablet; Refill: 0  White coat syndrome with diagnosis of hypertension -     Basic metabolic panel with GFR -     amLODIPine  Besylate; TAKE 1 TABLET(10 MG) BY MOUTH DAILY  Dispense: 90 tablet; Refill: 1    Return in about 6 months (around 12/29/2023).    Tonna Frederic, MD  5/5 addendum: TSH was elevated having increased levothyroxine  to 62.5 mg or one half of a 125.

## 2023-06-28 NOTE — Progress Notes (Signed)
 Hi Madison Huerta  Dr. Myrtie Atkinson reviewed your biopsy results and they showed the spot removed was benign (not cancerous).  No additional treatment is required.  The detailed report is available to view in MyChart.  Have a great day!  Kind Regards,  Dr. Christiane Cowing Care Team

## 2023-09-08 ENCOUNTER — Ambulatory Visit: Admitting: Dermatology

## 2023-09-08 ENCOUNTER — Encounter: Payer: Self-pay | Admitting: Dermatology

## 2023-09-08 VITALS — BP 139/81 | HR 78

## 2023-09-08 DIAGNOSIS — Z1283 Encounter for screening for malignant neoplasm of skin: Secondary | ICD-10-CM | POA: Diagnosis not present

## 2023-09-08 DIAGNOSIS — L821 Other seborrheic keratosis: Secondary | ICD-10-CM

## 2023-09-08 DIAGNOSIS — D1801 Hemangioma of skin and subcutaneous tissue: Secondary | ICD-10-CM

## 2023-09-08 DIAGNOSIS — W908XXA Exposure to other nonionizing radiation, initial encounter: Secondary | ICD-10-CM

## 2023-09-08 DIAGNOSIS — L578 Other skin changes due to chronic exposure to nonionizing radiation: Secondary | ICD-10-CM

## 2023-09-08 DIAGNOSIS — D229 Melanocytic nevi, unspecified: Secondary | ICD-10-CM

## 2023-09-08 DIAGNOSIS — L814 Other melanin hyperpigmentation: Secondary | ICD-10-CM

## 2023-09-08 NOTE — Progress Notes (Signed)
   Total Body Skin Exam (TBSE) Visit   Subjective  Madison Huerta is a 83 y.o. female who presents for the following: Skin Cancer Screening and Full Body Skin Exam  Patient presents today for follow up visit for TBSE. Patient was last evaluated on 06/10/23 . Patient denies medication changes. Patient reports she does have spots, moles and lesions of concern to be evaluated. Patient reports throughout her lifetime she has had moderate sun exposure. Currently, patient reports if she has excessive sun exposure, she does not apply sunscreen and/or wears protective coverings. Patient reports she has hx of bx (Benign Verruca). Patient denies  family history of skin cancers. The patient has spots, moles and lesions to be evaluated, some may be new or changing and the patient has concerns that these could be cancer.  The following portions of the chart were reviewed this encounter and updated as appropriate: medications, allergies, medical history  Review of Systems:  No other skin or systemic complaints except as noted in HPI or Assessment and Plan.  Objective  Well appearing patient in no apparent distress; mood and affect are within normal limits.  A full examination was performed including scalp, head, eyes, ears, nose, lips, neck, chest, axillae, abdomen, back, buttocks, bilateral upper extremities, bilateral lower extremities, hands, feet, fingers, toes, fingernails, and toenails. All findings within normal limits unless otherwise noted below.   Relevant physical exam findings are noted in the Assessment and Plan.    Assessment & Plan   LENTIGINES, SEBORRHEIC KERATOSES, HEMANGIOMAS - Benign normal skin lesions - Benign-appearing - Call for any changes  MELANOCYTIC NEVI - Tan-brown and/or pink-flesh-colored symmetric macules and papules - Benign appearing on exam today - Observation - Call clinic for new or changing moles - Recommend daily use of broad spectrum spf 30+ sunscreen to  sun-exposed areas.   ACTINIC DAMAGE - Chronic condition, secondary to cumulative UV/sun exposure - diffuse scaly erythematous macules with underlying dyspigmentation - Recommend daily broad spectrum sunscreen SPF 30+ to sun-exposed areas, reapply every 2 hours as needed.  - Staying in the shade or wearing long sleeves, sun glasses (UVA+UVB protection) and wide brim hats (4-inch brim around the entire circumference of the hat) are also recommended for sun protection.  - Call for new or changing lesions.  ACTINIC KERATOSIS Exam: Erythematous thin papules/macules with gritty scale at the ears  Actinic keratoses are precancerous spots that appear secondary to cumulative UV radiation exposure/sun exposure over time. They are chronic with expected duration over 1 year. A portion of actinic keratoses will progress to squamous cell carcinoma of the skin. It is not possible to reliably predict which spots will progress to skin cancer and so treatment is recommended to prevent development of skin cancer.  Recommend daily broad spectrum sunscreen SPF 30+ to sun-exposed areas, reapply every 2 hours as needed.  Recommend staying in the shade or wearing long sleeves, sun glasses (UVA+UVB protection) and wide brim hats (4-inch brim around the entire circumference of the hat). Call for new or changing lesions.  Treatment Plan: - Cryo Therapy Completed While in office today       SKIN CANCER SCREENING PERFORMED TODAY.    Return in about 1 year (around 09/07/2024) for TBSE.  I, Jetta Ager, am acting as Neurosurgeon for [employer], DO.  Documentation: I have reviewed the above documentation for accuracy and completeness, and I agree with the above.  Delon Lenis, DO

## 2023-09-08 NOTE — Patient Instructions (Addendum)

## 2023-10-01 ENCOUNTER — Other Ambulatory Visit: Payer: Self-pay | Admitting: Family Medicine

## 2023-10-01 DIAGNOSIS — E039 Hypothyroidism, unspecified: Secondary | ICD-10-CM

## 2023-11-02 ENCOUNTER — Telehealth: Payer: Self-pay | Admitting: Family Medicine

## 2023-11-02 NOTE — Telephone Encounter (Signed)
 Copied from CRM #[id number]. Topic: Referral - Request for Referral >> Nov 02, 2023  9:19 AM Martin[place] E wrote: Did the patient discuss referral with their provider in the last year? Yes (If No - schedule appointment) (If Yes - send message)  Appointment offered? No  Type of order/referral and detailed reason for visit: Urology  Preference of office, provider, location: Dr. Lonni Han at [facility] in [place]  If referral order, have you been seen by this specialty before? Yes, patient stated it was years ago though. She has an upcoming appointment with Dr. Han the end of October, but for insurance reasons she wanted to make sure a referral was also sent. (If Yes, this issue or another issue? When? Where?  Can we respond through MyChart? Yes

## 2023-11-15 ENCOUNTER — Ambulatory Visit: Admitting: Family Medicine

## 2023-12-09 ENCOUNTER — Ambulatory Visit (INDEPENDENT_AMBULATORY_CARE_PROVIDER_SITE_OTHER): Admitting: Family Medicine

## 2023-12-09 ENCOUNTER — Encounter: Payer: Self-pay | Admitting: Family Medicine

## 2023-12-09 VITALS — BP 132/64 | HR 71 | Temp 97.0°F | Ht 61.0 in | Wt 122.2 lb

## 2023-12-09 DIAGNOSIS — N814 Uterovaginal prolapse, unspecified: Secondary | ICD-10-CM | POA: Diagnosis not present

## 2023-12-09 DIAGNOSIS — I10 Essential (primary) hypertension: Secondary | ICD-10-CM | POA: Diagnosis not present

## 2023-12-09 DIAGNOSIS — E039 Hypothyroidism, unspecified: Secondary | ICD-10-CM

## 2023-12-09 DIAGNOSIS — E7800 Pure hypercholesterolemia, unspecified: Secondary | ICD-10-CM | POA: Diagnosis not present

## 2023-12-09 LAB — BASIC METABOLIC PANEL WITH GFR
BUN: 15 mg/dL (ref 6–23)
CO2: 22 meq/L (ref 19–32)
Calcium: 9.6 mg/dL (ref 8.4–10.5)
Chloride: 102 meq/L (ref 96–112)
Creatinine, Ser: 0.91 mg/dL (ref 0.40–1.20)
GFR: 59.18 mL/min — ABNORMAL LOW (ref >60.00)
Glucose, Bld: 86 mg/dL (ref 70–99)
Potassium: 3.9 meq/L (ref 3.5–5.1)
Sodium: 137 meq/L (ref 135–145)

## 2023-12-09 LAB — TSH: TSH: 0.39 u[IU]/mL (ref 0.35–5.50)

## 2023-12-09 LAB — LDL CHOLESTEROL, DIRECT: Direct LDL: 89 mg/dL

## 2023-12-09 NOTE — Progress Notes (Addendum)
 Referral for prolapsed bladder she states she looked it up she but she never went  Established Patient Office Visit   Subjective:  Patient ID: Madison Huerta, female    DOB: 1940/05/29  Age: 83 y.o. MRN: [id number]  Chief Complaint  Patient presents with   Bladder issues     Discuss new referral to Urology for prolapsed bladder started an OTC stool softer daily to help with straining for bladder    HPI Encounter Diagnoses  Name Primary?   White coat syndrome with diagnosis of hypertension Yes   Elevated cholesterol    Hypothyroidism, unspecified type    Cystocele with prolapse    For follow-up of above.  Blood pressures have been running in the 120s over 60s at home.  She is doing well with amlodipine .  Continues follow-up with cardiology.  Continues rosuvastatin  10 mg for elevated cholesterol.  Continues levothyroxine  62.5 mg daily for hypothyroidism.  Believes that she has a cystocele that prolapses when she strains to have a bowel movement.  She has started a stool softener which has been helpful.  Has seen Dr. Lonni Pang in the past and has an appointment scheduled with him next month and needs a referral.   Review of Systems  Constitutional: Negative.   HENT: Negative.    Eyes:  Negative for blurred vision, discharge and redness.  Respiratory: Negative.    Cardiovascular: Negative.   Gastrointestinal:  Negative for abdominal pain.  Genitourinary: Negative.   Musculoskeletal: Negative.  Negative for myalgias.  Skin:  Negative for rash.  Neurological:  Negative for tingling, loss of consciousness and weakness.  Endo/Heme/Allergies:  Negative for polydipsia.     Current Outpatient Medications:    acetaminophen  (TYLENOL ) 500 MG tablet, Take 500 mg by mouth every 4 (four) hours as needed for fever., Disp: , Rfl:    amLODipine  (NORVASC ) 10 MG tablet, TAKE 1 TABLET(10 MG) BY MOUTH DAILY, Disp: 90 tablet, Rfl: 1   cholecalciferol (VITAMIN D ) 1000 units tablet, Take  1,000 Units by mouth daily., Disp: , Rfl:    Docusate Calcium  (STOOL SOFTENER PO), Take 1 capsule by mouth daily., Disp: , Rfl:    levothyroxine  (SYNTHROID ) 125 MCG tablet, Please take 1 tablet each morning on a fasting stomach Monday through Saturday., Disp: 36 tablet, Rfl: 2   rosuvastatin  (CRESTOR ) 10 MG tablet, Take 1 tablet (10 mg total) by mouth daily., Disp: 90 tablet, Rfl: 1   Objective:     BP 132/64 (BP Location: Left Arm, Patient Position: Sitting, Cuff Size: Normal)   Pulse 71   Temp (!) 97 F (36.1 C) (Temporal)   Ht 5' 1 (1.549 m)   Wt 122 lb 3.2 oz (55.4 kg)   SpO2 98%   BMI 23.09 kg/m    Physical Exam Constitutional:      General: She is not in acute distress.    Appearance: Normal appearance. She is not ill-appearing, toxic-appearing or diaphoretic.  HENT:     Head: Normocephalic and atraumatic.     Right Ear: External ear normal.     Left Ear: External ear normal.  Eyes:     General: No scleral icterus.       Right eye: No discharge.        Left eye: No discharge.     Extraocular Movements: Extraocular movements intact.     Conjunctiva/sclera: Conjunctivae normal.  Cardiovascular:     Rate and Rhythm: Normal rate and regular rhythm.     Heart sounds: Murmur (  Harsh holosystolic murmur) heard.  Pulmonary:     Effort: Pulmonary effort is normal. No respiratory distress.     Breath sounds: No wheezing or rales.  Skin:    General: Skin is warm and dry.  Neurological:     Mental Status: She is alert and oriented to person, place, and time.  Psychiatric:        Mood and Affect: Mood normal.        Behavior: Behavior normal.      Results for orders placed or performed in visit on 12/09/23  TSH  Result Value Ref Range   TSH 0.39 0.35 - 5.50 uIU/mL  LDL cholesterol, direct  Result Value Ref Range   Direct LDL 89.0 mg/dL  Basic metabolic panel with GFR  Result Value Ref Range   Sodium 137 135 - 145 mEq/L   Potassium 3.9 3.5 - 5.1 mEq/L   Chloride  102 96 - 112 mEq/L   CO2 22 19 - 32 mEq/L   Glucose, Bld 86 70 - 99 mg/dL   BUN 15 6 - 23 mg/dL   Creatinine, Ser 9.08 0.40 - 1.20 mg/dL   GFR 40.81 (L) >39.99 mL/min   Calcium  9.6 8.4 - 10.5 mg/dL      The ASCVD Risk score (Arnett DK, et al., 2019) failed to calculate for the following reasons:   The 2019 ASCVD risk score is only valid for ages 63 to 37    Assessment & Plan:   White coat syndrome with diagnosis of hypertension -     Basic metabolic panel with GFR  Elevated cholesterol -     LDL cholesterol, direct  Hypothyroidism, unspecified type -     TSH -     Levothyroxine  Sodium; Please take 1 tablet each morning on a fasting stomach Monday through Saturday.  Dispense: 36 tablet; Refill: 2 -     TSH; Future  Cystocele with prolapse -     Ambulatory referral to Urology    Return in about 6 months (around 06/08/2024), or if symptoms worsen or fail to improve.    [place]lsie Sim Lent, MD   10/17 addendum: TSH returned in the low normal range.  Will continue current dose of levothyroxine  at 62.5 mcg daily Monday through Saturday and return in 6 weeks for repeat TSH.

## 2023-12-10 ENCOUNTER — Ambulatory Visit: Payer: Self-pay | Admitting: Family Medicine

## 2023-12-10 MED ORDER — LEVOTHYROXINE SODIUM 125 MCG PO TABS: ORAL_TABLET | ORAL | 2 refills | Status: AC

## 2023-12-10 NOTE — Addendum Note (Signed)
 Addended by: BERNETA ELSIE LABOR on: 12/10/2023 12:27 PM   Modules accepted: Orders

## 2023-12-16 DIAGNOSIS — N8111 Cystocele, midline: Secondary | ICD-10-CM | POA: Diagnosis not present

## 2023-12-17 NOTE — Progress Notes (Unsigned)
   12/17/2023  Patient ID: Madison Huerta, female   DOB: 1940-09-17, 83 y.o.   MRN: [id number]  This patient is appearing on a report for being at risk of failing the adherence measure for cholesterol (statin) medications this calendar year.   Medication: Rosuvastatin  10 mg tablets Last fill date: 12/06/23 for 90 day supply  Insurance report was not up to date. No action needed at this time.   Imad Shostak C. Makoto Sellitto [facility] PharmD Candidate Class of [phone]

## 2023-12-28 ENCOUNTER — Ambulatory Visit: Admitting: Family Medicine

## 2024-01-12 NOTE — Progress Notes (Signed)
 HPI: Follow-up VSD. Patient has a history of small ventricular septal defect previously followed by Dr. Gollan. She also has a history of endocarditis of the aortic valve. Last transesophageal echocardiogram October 2016 showed normal LV function, small mobile vegetation on the left coronary cusp, mild mitral regurgitation and small membranous VSD.  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  Since last seen there is no dyspnea, chest pain, palpitations or syncope.  Current Outpatient Medications  Medication Sig Dispense Refill   acetaminophen  (TYLENOL ) 500 MG tablet Take 500 mg by mouth every 4 (four) hours as needed for fever.     amLODipine  (NORVASC ) 10 MG tablet TAKE 1 TABLET(10 MG) BY MOUTH DAILY 90 tablet 1   cholecalciferol (VITAMIN D ) 1000 units tablet Take 1,000 Units by mouth daily.     Docusate Calcium  (STOOL SOFTENER PO) Take 1 capsule by mouth daily.     levothyroxine  (SYNTHROID ) 125 MCG tablet Please take 1 tablet each morning on a fasting stomach Monday through Saturday. 36 tablet 2   rosuvastatin  (CRESTOR ) 10 MG tablet Take 1 tablet (10 mg total) by mouth daily. 90 tablet 1   No current facility-administered medications for this visit.     Past Medical History:  Diagnosis Date   Allergy years ago   penicillin   Breast cancer (HCC) 1994   L breast- lumpectomy, care [facility] at [facility]    Breast cancer [facility]) 2013   right breast- mastectomy, care at [facility]   Cataract the start no date   Encounter for routine gynecological examination 02/26/2015   Endocarditis    GERD (gastroesophageal reflux disease) don,t know   on occasion   Heart murmur since birth   VSD- echocardiogram - [facility] 10 yrs. ago   Hyperlipidemia 5 years ago   Hypertension  2000?5   Hypothyroidism    Osteopenia    Personal history of radiation therapy 1994   left breast ca   Thyroid  disease years ago ?   Hypothyroidism   VSD (ventricular septal defect)    does need SBE prophylaxis (Keflex  3 gm 1 hour pprior and 1.5 gm 6 hours post    Past Surgical History:  Procedure Laterality Date   2D echo  2004   [facility] at [facility]   AUGMENTATION MAMMAPLASTY Bilateral    removed 1994   BREAST BIOPSY Left 1994   breast ca   BREAST BIOPSY Right 2013   breast ca   BREAST IMPLANT REMOVAL  1994   BREAST LUMPECTOMY Left 1994   lumpectomy and rad tx and tamoxifen   MASTECTOMY Right 01/08/2012   complete and anastrozole    MASTECTOMY W/ SENTINEL NODE BIOPSY  01/08/2012   Procedure: MASTECTOMY WITH SENTINEL LYMPH NODE BIOPSY;  Surgeon: Redell Faith, DO;  Location: [facility] OR;  Service: General;  Laterality: Right;  right breast mastectomy with sentinel lymph node biopsy    PARATHYROIDECTOMY  12/2002   TONSILLECTOMY     As a child   TUBAL LIGATION  years ago   1976    Social History   Socioeconomic History   Marital status: Widowed    Spouse name: Not on file   Number of children: 2   Years of education: Not on file   Highest education level: 12th grade  Occupational History   Occupation: Advertising Account Planner  Employer: UNEMPLOYED  Tobacco Use   Smoking status: Never    Passive exposure: Past   Smokeless tobacco: Never  Vaping Use   Vaping status: Never Used  Substance and Sexual Activity   Alcohol use: Yes    Alcohol/week: 3.0 standard drinks of alcohol    Types: 3 Glasses of wine per week   Drug use: No   Sexual activity: Never  Other Topics Concern   Not on file  Social History Narrative   Not on file   Social Drivers of Health   Financial Resource Strain: Low Risk  (12/05/2023)   Overall Financial Resource Strain (CARDIA)    Difficulty of Paying Living Expenses: Not hard at all  Food Insecurity: No Food Insecurity (12/05/2023)   Hunger Vital Sign     Worried About Running Out of Food in the Last Year: Never true    Ran Out of Food in the Last Year: Never true  Transportation Needs: No Transportation Needs (12/05/2023)   PRAPARE - Administrator, Civil Service (Medical): No    Lack of Transportation (Non-Medical): No  Physical Activity: Sufficiently Active (12/05/2023)   Exercise Vital Sign    Days of Exercise per Week: 5 days    Minutes of Exercise per Session: 50 min  Stress: No Stress Concern Present (12/05/2023)   [employer] of Occupational Health - Occupational Stress Questionnaire    Feeling of Stress: Not at all  Social Connections: Moderately Isolated (12/05/2023)   Social Connection and Isolation Panel    Frequency of Communication with Friends and Family: More than three times a week    Frequency of Social Gatherings with Friends and Family: More than three times a week    Attends Religious Services: Never    Database Administrator or Organizations: Yes    Attends Engineer, Structural: More than 4 times per year    Marital Status: Widowed  Intimate Partner Violence: Not At Risk (06/25/2021)   Humiliation, Afraid, Rape, and Kick questionnaire    Fear of Current or Ex-Partner: No    Emotionally Abused: No    Physically Abused: No    Sexually Abused: No    Family History  Problem Relation Age of Onset   Aneurysm Mother        brain   Cancer Father        Lung, Liver mets   Lung cancer Father 31   Heart disease Other    Cancer Cousin        Breast CA   Breast cancer Cousin 30   Breast cancer Maternal Aunt 55    ROS: no fevers or chills, productive cough, hemoptysis, dysphasia, odynophagia, melena, hematochezia, dysuria, hematuria, rash, seizure activity, orthopnea, PND, pedal edema, claudication. Remaining systems are negative.  Physical Exam: Well-developed well-nourished in no acute distress.  Skin is warm and dry.  HEENT is normal.  Neck is supple.  Chest is clear to  auscultation with normal expansion.  Cardiovascular exam is regular rate and rhythm.  3/6 systolic murmur left sternal border.  Abdominal exam nontender or distended. No masses palpated. Extremities show no edema. neuro grossly intact  EKG Interpretation Date/Time:  Wednesday January 19 2024 08:57:36 EST Ventricular Rate:  83 PR Interval:  154 QRS Duration:  76 QT Interval:  514 QTC Calculation: 603 R Axis:   53  Text Interpretation: Normal sinus rhythm Nonspecific ST abnormality Confirmed by Pietro Rogue ([id number]) on 01/19/2024 9:08:19 AM    A/P  1 ventricular septal defect-small on previous echocardiogram.  Will repeat study.  If right ventricular size and function remain normal we will likely follow given patient's age.  2 hypertension-patient's blood pressure is elevated; however she follows this at home and it is typically controlled.  Continue present medications and adjust based on follow-up readings.  3 hyperlipidemia-continue statin.  4 history of endocarditis-no recurrences.  Continue SBE prophylaxis.  Redell Shallow, MD

## 2024-01-17 ENCOUNTER — Other Ambulatory Visit (INDEPENDENT_AMBULATORY_CARE_PROVIDER_SITE_OTHER)

## 2024-01-17 DIAGNOSIS — E039 Hypothyroidism, unspecified: Secondary | ICD-10-CM | POA: Diagnosis not present

## 2024-01-17 LAB — TSH: TSH: 2.72 u[IU]/mL (ref 0.35–5.50)

## 2024-01-19 ENCOUNTER — Encounter: Payer: Self-pay | Admitting: Cardiology

## 2024-01-19 ENCOUNTER — Ambulatory Visit: Attending: Cardiology | Admitting: Cardiology

## 2024-01-19 VITALS — BP 166/76 | HR 83 | Ht 61.0 in | Wt 122.0 lb

## 2024-01-19 DIAGNOSIS — I10 Essential (primary) hypertension: Secondary | ICD-10-CM

## 2024-01-19 DIAGNOSIS — E7800 Pure hypercholesterolemia, unspecified: Secondary | ICD-10-CM | POA: Diagnosis not present

## 2024-01-19 DIAGNOSIS — Q210 Ventricular septal defect: Secondary | ICD-10-CM

## 2024-01-19 NOTE — Patient Instructions (Signed)
   Testing/Procedures:  Your physician has requested that you have an echocardiogram. Echocardiography is a painless test that uses sound waves to create images of your heart. It provides your doctor with information about the size and shape of your heart and how well your heart's chambers and valves are working. This procedure takes approximately one hour. There are no restrictions for this procedure. Please do NOT wear cologne, perfume, aftershave, or lotions (deodorant is allowed). Please arrive 15 minutes prior to your appointment time.  Please note: We ask at that you not bring children with you during ultrasound (echo/ vascular) testing. Due to room size and safety concerns, children are not allowed in the ultrasound rooms during exams. Our front office staff cannot provide observation of children in our lobby area while testing is being conducted. An adult accompanying a patient to their appointment will only be allowed in the ultrasound room at the discretion of the ultrasound technician under special circumstances. We apologize for any inconvenience. [facility] 1 ST FLOOR IMAGING DEPARTMENT  Follow-Up: At [facility], you and your health needs are our priority.  As part of our continuing mission to provide you with exceptional heart care, our providers are all part of one team.  This team includes your primary Cardiologist (physician) and Advanced Practice Providers or APPs (Physician Assistants and Nurse Practitioners) who all work together to provide you with the care you need, when you need it.  Your next appointment:   12 month(s)  Provider:   Alexandria Angel, MD

## 2024-02-03 ENCOUNTER — Other Ambulatory Visit: Payer: Self-pay | Admitting: Family Medicine

## 2024-02-03 DIAGNOSIS — Z1231 Encounter for screening mammogram for malignant neoplasm of breast: Secondary | ICD-10-CM

## 2024-02-10 ENCOUNTER — Ambulatory Visit: Payer: Self-pay | Admitting: Cardiology

## 2024-02-10 ENCOUNTER — Ambulatory Visit (HOSPITAL_BASED_OUTPATIENT_CLINIC_OR_DEPARTMENT_OTHER): Admission: RE | Admit: 2024-02-10 | Discharge: 2024-02-10 | Attending: Cardiology | Admitting: Cardiology

## 2024-02-10 DIAGNOSIS — Q210 Ventricular septal defect: Secondary | ICD-10-CM

## 2024-02-10 LAB — ECHOCARDIOGRAM COMPLETE
AR max vel: 1.51 cm2
AV Area VTI: 1.59 cm2
AV Area mean vel: 1.58 cm2
AV Mean grad: 7 mmHg
AV Peak grad: 12.2 mmHg
Ao pk vel: 1.75 m/s
Area-P 1/2: 4.29 cm2
Calc EF: 63.7 %
MV M vel: 5.36 m/s
MV Peak grad: 114.9 mmHg
S' Lateral: 2.4 cm
Single Plane A2C EF: 62.6 %
Single Plane A4C EF: 68.6 %

## 2024-02-11 ENCOUNTER — Other Ambulatory Visit: Payer: Self-pay | Admitting: Family Medicine

## 2024-02-11 DIAGNOSIS — E039 Hypothyroidism, unspecified: Secondary | ICD-10-CM

## 2024-03-13 ENCOUNTER — Other Ambulatory Visit: Payer: Self-pay | Admitting: Family Medicine

## 2024-03-13 DIAGNOSIS — E782 Mixed hyperlipidemia: Secondary | ICD-10-CM

## 2024-03-15 ENCOUNTER — Ambulatory Visit
Admission: RE | Admit: 2024-03-15 | Discharge: 2024-03-15 | Disposition: A | Discharge status: Home / Self Care | Source: Ambulatory Visit | Attending: Family Medicine | Admitting: Family Medicine

## 2024-03-15 DIAGNOSIS — Z1231 Encounter for screening mammogram for malignant neoplasm of breast: Secondary | ICD-10-CM | POA: Diagnosis present

## 2024-06-02 ENCOUNTER — Ambulatory Visit

## 2024-06-06 ENCOUNTER — Ambulatory Visit

## 2024-06-08 ENCOUNTER — Ambulatory Visit: Admitting: Family Medicine

## 2024-09-11 ENCOUNTER — Ambulatory Visit: Admitting: Dermatology
